# Patient Record
Sex: Female | Born: 1940 | Race: White | Hispanic: No | Marital: Single | Smoking: Never smoker
Health system: Southern US, Community
[De-identification: ages and names within clinical notes are randomized; demographics above are authoritative.]

## PROBLEM LIST (undated history)

## (undated) DIAGNOSIS — R600 Localized edema: Secondary | ICD-10-CM

## (undated) DIAGNOSIS — D649 Anemia, unspecified: Secondary | ICD-10-CM

## (undated) DIAGNOSIS — F419 Anxiety disorder, unspecified: Secondary | ICD-10-CM

## (undated) DIAGNOSIS — K219 Gastro-esophageal reflux disease without esophagitis: Secondary | ICD-10-CM

## (undated) DIAGNOSIS — Z8719 Personal history of other diseases of the digestive system: Secondary | ICD-10-CM

## (undated) DIAGNOSIS — I739 Peripheral vascular disease, unspecified: Secondary | ICD-10-CM

## (undated) DIAGNOSIS — M1990 Unspecified osteoarthritis, unspecified site: Secondary | ICD-10-CM

## (undated) DIAGNOSIS — N390 Urinary tract infection, site not specified: Secondary | ICD-10-CM

## (undated) DIAGNOSIS — Z01419 Encounter for gynecological examination (general) (routine) without abnormal findings: Secondary | ICD-10-CM

## (undated) DIAGNOSIS — K589 Irritable bowel syndrome without diarrhea: Secondary | ICD-10-CM

## (undated) DIAGNOSIS — Z9889 Other specified postprocedural states: Secondary | ICD-10-CM

## (undated) DIAGNOSIS — I8393 Asymptomatic varicose veins of bilateral lower extremities: Secondary | ICD-10-CM

## (undated) DIAGNOSIS — K5790 Diverticulosis of intestine, part unspecified, without perforation or abscess without bleeding: Secondary | ICD-10-CM

## (undated) HISTORY — DX: Unspecified osteoarthritis, unspecified site: M19.90

## (undated) HISTORY — DX: Asymptomatic varicose veins of bilateral lower extremities: I83.93

## (undated) HISTORY — DX: Peripheral vascular disease, unspecified: I73.9

## (undated) HISTORY — DX: Personal history of other diseases of the digestive system: Z87.19

## (undated) HISTORY — PX: BACK SURGERY: SHX140

## (undated) HISTORY — PX: TONSILLECTOMY: SUR1361

## (undated) HISTORY — DX: Diverticulosis of intestine, part unspecified, without perforation or abscess without bleeding: K57.90

## (undated) HISTORY — DX: Urinary tract infection, site not specified: N39.0

## (undated) HISTORY — DX: Irritable bowel syndrome, unspecified: K58.9

## (undated) HISTORY — DX: Encounter for gynecological examination (general) (routine) without abnormal findings: Z01.419

## (undated) HISTORY — DX: Other specified postprocedural states: Z98.890

## (undated) HISTORY — DX: Localized edema: R60.0

## (undated) HISTORY — DX: Gastro-esophageal reflux disease without esophagitis: K21.9

## (undated) HISTORY — PX: OTHER SURGICAL HISTORY: SHX169

## (undated) HISTORY — PX: ESOPHAGOGASTRODUODENOSCOPY (EGD) WITH ESOPHAGEAL DILATION: SHX5812

---

## 1998-02-27 ENCOUNTER — Other Ambulatory Visit: Admission: RE | Admit: 1998-02-27 | Discharge: 1998-02-27 | Payer: Self-pay | Admitting: Obstetrics and Gynecology

## 1999-12-23 ENCOUNTER — Encounter: Payer: Self-pay | Admitting: Obstetrics and Gynecology

## 1999-12-23 ENCOUNTER — Encounter: Admission: RE | Admit: 1999-12-23 | Discharge: 1999-12-23 | Payer: Self-pay | Admitting: Obstetrics and Gynecology

## 2003-03-15 ENCOUNTER — Encounter: Admission: RE | Admit: 2003-03-15 | Discharge: 2003-03-15 | Payer: Self-pay | Admitting: Obstetrics and Gynecology

## 2003-03-15 ENCOUNTER — Encounter: Payer: Self-pay | Admitting: Obstetrics and Gynecology

## 2003-05-20 ENCOUNTER — Other Ambulatory Visit: Admission: RE | Admit: 2003-05-20 | Discharge: 2003-05-20 | Payer: Self-pay | Admitting: Obstetrics and Gynecology

## 2004-05-25 ENCOUNTER — Other Ambulatory Visit: Admission: RE | Admit: 2004-05-25 | Discharge: 2004-05-25 | Payer: Self-pay | Admitting: Obstetrics and Gynecology

## 2004-07-06 ENCOUNTER — Encounter: Admission: RE | Admit: 2004-07-06 | Discharge: 2004-07-06 | Payer: Self-pay | Admitting: Gastroenterology

## 2004-11-30 ENCOUNTER — Ambulatory Visit (HOSPITAL_COMMUNITY): Admission: RE | Admit: 2004-11-30 | Discharge: 2004-11-30 | Payer: Self-pay | Admitting: Obstetrics and Gynecology

## 2005-06-03 ENCOUNTER — Other Ambulatory Visit: Admission: RE | Admit: 2005-06-03 | Discharge: 2005-06-03 | Payer: Self-pay | Admitting: Obstetrics and Gynecology

## 2005-12-03 ENCOUNTER — Ambulatory Visit: Payer: Self-pay | Admitting: Family Medicine

## 2005-12-16 ENCOUNTER — Ambulatory Visit: Payer: Self-pay | Admitting: Family Medicine

## 2005-12-24 ENCOUNTER — Encounter: Admission: RE | Admit: 2005-12-24 | Discharge: 2005-12-24 | Payer: Self-pay | Admitting: Orthopedic Surgery

## 2006-05-05 ENCOUNTER — Encounter: Admission: RE | Admit: 2006-05-05 | Discharge: 2006-05-05 | Payer: Self-pay | Admitting: Obstetrics and Gynecology

## 2006-07-14 ENCOUNTER — Other Ambulatory Visit: Admission: RE | Admit: 2006-07-14 | Discharge: 2006-07-14 | Payer: Self-pay | Admitting: Obstetrics and Gynecology

## 2007-05-02 DIAGNOSIS — K219 Gastro-esophageal reflux disease without esophagitis: Secondary | ICD-10-CM | POA: Insufficient documentation

## 2007-05-08 ENCOUNTER — Encounter: Admission: RE | Admit: 2007-05-08 | Discharge: 2007-05-08 | Payer: Self-pay | Admitting: Obstetrics and Gynecology

## 2007-05-09 ENCOUNTER — Ambulatory Visit: Payer: Self-pay | Admitting: Family Medicine

## 2007-05-09 DIAGNOSIS — M1990 Unspecified osteoarthritis, unspecified site: Secondary | ICD-10-CM

## 2007-05-09 DIAGNOSIS — R609 Edema, unspecified: Secondary | ICD-10-CM | POA: Insufficient documentation

## 2007-05-09 DIAGNOSIS — M179 Osteoarthritis of knee, unspecified: Secondary | ICD-10-CM | POA: Insufficient documentation

## 2007-05-09 DIAGNOSIS — Z9189 Other specified personal risk factors, not elsewhere classified: Secondary | ICD-10-CM | POA: Insufficient documentation

## 2007-07-20 ENCOUNTER — Other Ambulatory Visit: Admission: RE | Admit: 2007-07-20 | Discharge: 2007-07-20 | Payer: Self-pay | Admitting: Obstetrics and Gynecology

## 2007-12-25 ENCOUNTER — Ambulatory Visit: Payer: Self-pay | Admitting: Family Medicine

## 2007-12-25 LAB — CONVERTED CEMR LAB
Bilirubin Urine: NEGATIVE
Specific Gravity, Urine: 1.01
Urobilinogen, UA: 0.2

## 2008-01-03 ENCOUNTER — Encounter: Payer: Self-pay | Admitting: Family Medicine

## 2008-01-18 ENCOUNTER — Telehealth (INDEPENDENT_AMBULATORY_CARE_PROVIDER_SITE_OTHER): Payer: Self-pay | Admitting: *Deleted

## 2008-01-23 ENCOUNTER — Ambulatory Visit: Payer: Self-pay | Admitting: Family Medicine

## 2008-01-23 LAB — CONVERTED CEMR LAB
Bilirubin Urine: NEGATIVE
Glucose, Urine, Semiquant: NEGATIVE
Specific Gravity, Urine: 1.02
pH: 5

## 2008-02-06 ENCOUNTER — Ambulatory Visit: Payer: Self-pay | Admitting: Family Medicine

## 2008-02-06 ENCOUNTER — Telehealth: Payer: Self-pay | Admitting: Family Medicine

## 2008-02-06 LAB — CONVERTED CEMR LAB
Glucose, Urine, Semiquant: NEGATIVE
Specific Gravity, Urine: 1.015
pH: 6.5

## 2008-03-11 ENCOUNTER — Encounter: Payer: Self-pay | Admitting: Family Medicine

## 2008-05-22 ENCOUNTER — Encounter: Admission: RE | Admit: 2008-05-22 | Discharge: 2008-05-22 | Payer: Self-pay | Admitting: Obstetrics and Gynecology

## 2008-06-13 ENCOUNTER — Ambulatory Visit: Payer: Self-pay | Admitting: Family Medicine

## 2008-07-22 ENCOUNTER — Other Ambulatory Visit: Admission: RE | Admit: 2008-07-22 | Discharge: 2008-07-22 | Payer: Self-pay | Admitting: Obstetrics and Gynecology

## 2008-12-24 ENCOUNTER — Ambulatory Visit: Payer: Self-pay | Admitting: Sports Medicine

## 2008-12-24 DIAGNOSIS — M722 Plantar fascial fibromatosis: Secondary | ICD-10-CM

## 2008-12-24 DIAGNOSIS — M7750 Other enthesopathy of unspecified foot: Secondary | ICD-10-CM | POA: Insufficient documentation

## 2009-02-24 ENCOUNTER — Ambulatory Visit: Payer: Self-pay | Admitting: Sports Medicine

## 2009-04-29 ENCOUNTER — Ambulatory Visit: Payer: Self-pay | Admitting: Sports Medicine

## 2009-05-27 ENCOUNTER — Encounter: Admission: RE | Admit: 2009-05-27 | Discharge: 2009-05-27 | Payer: Self-pay | Admitting: Obstetrics and Gynecology

## 2009-07-24 ENCOUNTER — Other Ambulatory Visit: Admission: RE | Admit: 2009-07-24 | Discharge: 2009-07-24 | Payer: Self-pay | Admitting: Obstetrics and Gynecology

## 2009-10-09 ENCOUNTER — Ambulatory Visit: Payer: Self-pay | Admitting: Family Medicine

## 2009-11-18 ENCOUNTER — Encounter: Payer: Self-pay | Admitting: Family Medicine

## 2009-11-19 ENCOUNTER — Encounter (INDEPENDENT_AMBULATORY_CARE_PROVIDER_SITE_OTHER): Payer: Self-pay | Admitting: *Deleted

## 2009-12-03 ENCOUNTER — Encounter: Payer: Self-pay | Admitting: Family Medicine

## 2009-12-11 ENCOUNTER — Encounter (INDEPENDENT_AMBULATORY_CARE_PROVIDER_SITE_OTHER): Payer: Self-pay | Admitting: *Deleted

## 2010-01-08 ENCOUNTER — Encounter (INDEPENDENT_AMBULATORY_CARE_PROVIDER_SITE_OTHER): Payer: Self-pay | Admitting: *Deleted

## 2010-01-08 ENCOUNTER — Ambulatory Visit: Payer: Self-pay | Admitting: Gastroenterology

## 2010-01-08 DIAGNOSIS — R1314 Dysphagia, pharyngoesophageal phase: Secondary | ICD-10-CM

## 2010-01-19 ENCOUNTER — Ambulatory Visit: Payer: Self-pay | Admitting: Gastroenterology

## 2010-01-19 HISTORY — PX: ESOPHAGOGASTRODUODENOSCOPY: SHX1529

## 2010-06-01 ENCOUNTER — Ambulatory Visit: Payer: Self-pay | Admitting: Family Medicine

## 2010-07-01 ENCOUNTER — Encounter: Admission: RE | Admit: 2010-07-01 | Discharge: 2010-07-01 | Payer: Self-pay | Admitting: Family Medicine

## 2010-11-01 LAB — CONVERTED CEMR LAB
ALT: 13 units/L (ref 0–35)
ALT: 17 units/L (ref 0–35)
AST: 18 units/L (ref 0–37)
AST: 18 units/L (ref 0–37)
Albumin: 3.8 g/dL (ref 3.5–5.2)
Albumin: 3.8 g/dL (ref 3.5–5.2)
Albumin: 4 g/dL (ref 3.5–5.2)
BUN: 10 mg/dL (ref 6–23)
BUN: 8 mg/dL (ref 6–23)
Basophils Absolute: 0.1 10*3/uL (ref 0.0–0.1)
Basophils Relative: 0.6 % (ref 0.0–3.0)
Basophils Relative: 0.9 % (ref 0.0–3.0)
Bilirubin, Direct: 0 mg/dL (ref 0.0–0.3)
CO2: 30 meq/L (ref 19–32)
Chloride: 102 meq/L (ref 96–112)
Chloride: 104 meq/L (ref 96–112)
Chloride: 104 meq/L (ref 96–112)
Cholesterol: 204 mg/dL (ref 0–200)
Creatinine, Ser: 0.8 mg/dL (ref 0.4–1.2)
Direct LDL: 116.2 mg/dL
Eosinophils Absolute: 0.1 10*3/uL (ref 0.0–0.6)
Eosinophils Absolute: 0.1 10*3/uL (ref 0.0–0.7)
Eosinophils Absolute: 0.1 10*3/uL (ref 0.0–0.7)
Eosinophils Relative: 1.7 % (ref 0.0–5.0)
Eosinophils Relative: 2 % (ref 0.0–5.0)
GFR calc non Af Amer: 66 mL/min
GFR calc non Af Amer: 76 mL/min
Glucose, Bld: 85 mg/dL (ref 70–99)
HCT: 40.4 % (ref 36.0–46.0)
HCT: 40.9 % (ref 36.0–46.0)
HCT: 41.9 % (ref 36.0–46.0)
HDL: 53.4 mg/dL (ref >39.0)
Hemoglobin: 13.2 g/dL (ref 12.0–15.0)
LDL Cholesterol: 124 mg/dL — ABNORMAL HIGH (ref 0–99)
MCHC: 32.4 g/dL (ref 30.0–36.0)
MCHC: 33.7 g/dL (ref 30.0–36.0)
MCV: 91.7 fL (ref 78.0–100.0)
MCV: 92.7 fL (ref 78.0–100.0)
MCV: 93.2 fL (ref 78.0–100.0)
Monocytes Absolute: 0.5 10*3/uL (ref 0.1–1.0)
Monocytes Relative: 8.5 % (ref 3.0–11.0)
Monocytes Relative: 8.5 % (ref 3.0–12.0)
Neutro Abs: 4 10*3/uL (ref 1.4–7.7)
Neutrophils Relative %: 60.4 % (ref 43.0–77.0)
Neutrophils Relative %: 66.6 % (ref 43.0–77.0)
Platelets: 276 10*3/uL (ref 150–400)
Potassium: 4.2 meq/L (ref 3.5–5.1)
RBC: 4.39 M/uL (ref 3.87–5.11)
RBC: 4.41 M/uL (ref 3.87–5.11)
RBC: 4.52 M/uL (ref 3.87–5.11)
Sodium: 140 meq/L (ref 135–145)
Sodium: 142 meq/L (ref 135–145)
TSH: 2.42 microintl units/mL (ref 0.35–5.50)
TSH: 3 microintl units/mL (ref 0.35–5.50)
Total CHOL/HDL Ratio: 4
Total Protein: 7.4 g/dL (ref 6.0–8.3)
Total Protein: 7.6 g/dL (ref 6.0–8.3)
Triglycerides: 73 mg/dL (ref 0.0–149.0)
Triglycerides: 81 mg/dL (ref 0–149)
VLDL: 16 mg/dL (ref 0–40)
Vit D, 25-Hydroxy: 44 ng/mL (ref 30–89)
WBC: 6.5 10*3/uL (ref 4.5–10.5)
WBC: 8.7 10*3/uL (ref 4.5–10.5)

## 2010-11-05 NOTE — Assessment & Plan Note (Signed)
Summary: diarrhea x 1 week//ccm   Vital Signs:  Patient profile:   70 year old female Weight:      230 pounds BMI:     37.26 BP sitting:   104 / 74  (left arm) Cuff size:   regular  Vitals Entered By: Raechel Ache, RN (June 01, 2010 11:24 AM) CC: Started with diarrhea while in [place] July 24th- mother had MI and she thought it was due to nerves. Got a little better and then worse again last week, took Immodium all weekend.   History of Present Illness: Here for several weeks of intermittent diarrhea. This started during a vacation stay in [place] and continued until several days ago. Now she has felt much better for the past several days. No nausea or vomitting or fever. She has had mild generalized cramps, but is able to eat normally. No fever. She has been under a lot of stress since her mother recently had a light heart attack. She has been taking Omeprazole daily.   Allergies: No Known Drug Allergies  Past History:  Past Medical History: Reviewed history from 01/08/2010 and no changes required. GERD Osteoarthritis sees Dr. Artist Pais for gyn exams IBS LE edema frequent UTI's, sees Dr. Earlene Plater varicose leg veins Diverticulosis  9 years ago Urinary Tract Infection  Past Surgical History: Tonsillectomy Colonoscopy-2002 per Dr. Sherin Quarry, repeat in 10 years right knee arthroscopy per Dr. Eulah Pont 12-17-05  endovascular repair of bilateral leg varicosities per Dr. Jimmie Molly  Review of Systems  The patient denies anorexia, fever, weight loss, weight gain, vision loss, decreased hearing, hoarseness, chest pain, syncope, dyspnea on exertion, peripheral edema, prolonged cough, headaches, hemoptysis, abdominal pain, melena, hematochezia, severe indigestion/heartburn, hematuria, incontinence, genital sores, muscle weakness, suspicious skin lesions, transient blindness, difficulty walking, depression, unusual weight change, abnormal bleeding, enlarged lymph nodes, angioedema,  breast masses, and testicular masses.    Physical Exam  General:  Well-developed,well-nourished,in no acute distress; alert,appropriate and cooperative throughout examination Lungs:  Normal respiratory effort, chest expands symmetrically. Lungs are clear to auscultation, no crackles or wheezes. Heart:  Normal rate and regular rhythm. S1 and S2 normal without gallop, murmur, click, rub or other extra sounds. Abdomen:  Bowel sounds positive,abdomen soft and non-tender without masses, organomegaly or hernias noted.   Impression & Recommendations:  Problem # 1:  ABDOMINAL PAIN, GENERALIZED (ICD-789.07)  Complete Medication List: 1)  Aspirin 325 Mg Tabs (Aspirin) .Marland Kitchen.. 1 by mouth once daily 2)  Fish Oil 300 Mg Caps (Omega-3 fatty acids) .Marland Kitchen.. 1 by mouth once daily 3)  Calcium 500/d 500-400 Mg-unit Chew (Calcium-vitamin d) .Marland Kitchen.. 1 by mouth once daily 4)  Vitamin D 2000 Unit Tabs (Cholecalciferol) .... Once daily 5)  Levsin/sl 0.125 Mg Subl (Hyoscyamine sulfate) .Marland Kitchen.. 1 sl q 4-6 hrs as needed 6)  Omeprazole 40 Mg Cpdr (Omeprazole) .... Once daily  Patient Instructions: 1)  This seems to be her old IBS acting up, and her recent stress has been a big contributing factor. She seems to be getting better now, so we will follow up as needed .

## 2010-11-05 NOTE — Letter (Signed)
Summary: New Patient letter  [facility]  [address] [place], [place] [address]   Phone: [phone]  Fax: [phone]       12/11/2009 MRN: [id number]  Crystal Little [address] [place], [place]  [address]  Dear [name],  Welcome to the Gastroenterology Division at [facility].    You are scheduled to see Dr.  Jarold Motto on January 08, 2010 at 9:30am on the 3rd floor at [facility], [address].  We ask that you try to arrive at our office 15 minutes prior to your appointment time to allow for check-in.  We would like you to complete the enclosed self-administered evaluation form prior to your visit and bring it with you on the day of your appointment.  We will review it with you.  Also, please bring a complete list of all your medications or, if you prefer, bring the medication bottles and we will list them.  Please bring your insurance card so that we may make a copy of it.  If your insurance requires a referral to see a specialist, please bring your referral form from your primary care physician.  Co-payments are due at the time of your visit and may be paid by cash, check or credit card.     Your office visit will consist of a consult with your physician (includes a physical exam), any laboratory testing he/she may order, scheduling of any necessary diagnostic testing (e.g. x-ray, ultrasound, CT-scan), and scheduling of a procedure (e.g. Endoscopy, Colonoscopy) if required.  Please allow enough time on your schedule to allow for any/all of these possibilities.    If you cannot keep your appointment, please call [phone] to cancel or reschedule prior to your appointment date.  This allows [place] the opportunity to schedule an appointment for another patient in need of care.  If you do not cancel or reschedule by 5 p.m. the business day prior to your appointment date, you will be charged a $50.00 late cancellation/no-show fee.    Thank you for  choosing [facility] for your medical needs.  We appreciate the opportunity to care for you.  Please visit [place] at our website  to learn more about our practice.                     Sincerely,                                                             The Gastroenterology Division

## 2010-11-05 NOTE — Procedures (Signed)
Summary: Upper Endoscopy  Patient: Kaitland Lewellyn Note: All result statuses are Final unless otherwise noted.  Tests: (1) Upper Endoscopy (EGD)   EGD Upper Endoscopy       DONE     [facility]     [address]     [place], [place]  [address]           ENDOSCOPY PROCEDURE REPORT           PATIENT:  Lanaiya, Lantry  MR#:  [id number]     BIRTHDATE:  August 17, 1941, 68 yrs. old  GENDER:  female           ENDOSCOPIST:  Vania Rea. Jarold Motto, MD, [facility]     Referred by:           PROCEDURE DATE:  01/19/2010     PROCEDURE:  EGD with balloon dilatation     ASA CLASS:  Class I     INDICATIONS:  dysphagia, GERD           MEDICATIONS:   Fentanyl 25 mcg IV, Versed 5 mg IV, glycopyrrolate     (Robinal) 0.2 mg IV     TOPICAL ANESTHETIC:  Exactacain Spray           DESCRIPTION OF PROCEDURE:   After the risks benefits and     alternatives of the procedure were thoroughly explained, informed     consent was obtained.  The LB GIF-H180 [id number] endoscope was     introduced through the mouth and advanced to the second portion of     the duodenum, without limitations.  The instrument was slowly     withdrawn as the mucosa was fully examined.     <<PROCEDUREIMAGES>>           A hiatal hernia was found. large 5 cm. hh and stricture at GE     junction.DILATED #18 MM PNEUMATIC BALON AND HELD FOR .  Normal     duodenal folds were noted.  The stomach was entered and closely     examined. The antrum, angularis, and lesser curvature were well     visualized, including a retroflexed view of the cardia and fundus.     The stomach wall was normally distensable. The scope passed easily     through the pylorus into the duodenum.    Retroflexed views     revealed a hiatal hernia.    The scope was then withdrawn from the     patient and the procedure completed.           COMPLICATIONS:  None           ENDOSCOPIC IMPRESSION:     1) Hiatal hernia     2) Normal duodenal folds     3) Normal stomach     CHRONIC AND PEPTIC STRICTURE DILATED.     RECOMMENDATIONS:     1) Clear liquids until, then soft foods rest iof day. Resume     prior diet tomorrow.     2) continue current medications     CONSIDER FUNDOPLICATION.           REPEAT EXAM:  No           ______________________________     Vania Rea. Jarold Motto, MD, [facility]           CC:  Nelwyn Salisbury, MD           n.     Rosalie DoctorMarland Kitchen   Vania Rea. Jarold Motto at  01/19/2010 02:04 PM           Geradine Girt, [id number]  Note: An exclamation mark (!) indicates a result that was not dispersed into the flowsheet. Document Creation Date: 01/19/2010 2:04 PM _______________________________________________________________________  (1) Order result status: Final Collection or observation date-time: 01/19/2010 13:52 Requested date-time:  Receipt date-time:  Reported date-time:  Referring Physician:   Ordering Physician: Sheryn Bison [phone]) Specimen Source:  Source: Launa Grill Order Number: [id number] Lab site:

## 2010-11-05 NOTE — Letter (Signed)
Summary: [facility]  [facility]   Imported By: Maryln Gottron 12/26/2009 10:24:22  _____________________________________________________________________  External Attachment:    Type:   Image     Comment:   External Document

## 2010-11-05 NOTE — Letter (Signed)
Summary: EGD Instructions  [facility]  [address] [place], [place] [address]   Phone: [phone]  Fax: [phone]       Crystal Little    1941/01/05    MRN: [id number]       Procedure Day /Date: Monday, 01/19/10     Arrival Time:  1:00     Procedure Time: 2:00     Location of Procedure:                    _ X _ [facility] (4th Floor)    PREPARATION FOR ENDOSCOPY   On 01/19/10  THE DAY OF THE PROCEDURE:  1.   No solid foods, milk or milk products are allowed after midnight the night before your procedure.  2.   Do not drink anything colored red or purple.  Avoid juices with pulp.  No orange juice.  3.  You may drink clear liquids until 12:00, which is 2 hours before your procedure.                                                                                                CLEAR LIQUIDS INCLUDE: Water Jello Ice Popsicles Tea (sugar ok, no milk/cream) Powdered fruit flavored drinks Coffee (sugar ok, no milk/cream) Gatorade Juice: apple, white grape, white cranberry  Lemonade Clear bullion, consomm, broth Carbonated beverages (any kind) Strained chicken noodle soup Hard Candy   MEDICATION INSTRUCTIONS  Unless otherwise instructed, you should take regular prescription medications with a small sip of water as early as possible the morning of your procedure.                    OTHER INSTRUCTIONS  You will need a responsible adult at least 72 years of age to accompany you and drive you home.   This person must remain in the waiting room during your procedure.  Wear loose fitting clothing that is easily removed.  Leave jewelry and other valuables at home.  However, you may wish to bring a book to read or an iPod/MP3 player to listen to music as you wait for your procedure to start.  Remove all body piercing jewelry and leave at home.  Total time from sign-in until discharge is approximately 2-3 hours.  You should  go home directly after your procedure and rest.  You can resume normal activities the day after your procedure.  The day of your procedure you should not:   Drive   Make legal decisions   Operate machinery   Drink alcohol   Return to work  You will receive specific instructions about eating, activities and medications before you leave.    The above instructions have been reviewed and explained to me by   _______________________    I fully understand and can verbalize these instructions _____________________________ Date _________

## 2010-11-05 NOTE — Miscellaneous (Signed)
Summary: Orders Update   Clinical Lists Changes  Orders: Added new Referral order of Gastroenterology Referral (GI) - Signed 

## 2010-11-05 NOTE — Assessment & Plan Note (Signed)
Summary: emp/pt fasting/cjr/pt rsc from bmp/cjr   Vital Signs:  Patient profile:   70 year old female Height:      66 inches Weight:      234 pounds BMI:     37.91 Temp:     98.1 degrees F oral Pulse rate:   91 / minute BP sitting:   122 / 70  (left arm) Cuff size:   large  Vitals Entered By: Alfred Levins, CMA (October 09, 2009 9:08 AM) CC: cpx, fasting, no pap   History of Present Illness: 70 yr old female for cpx. She feels well and has no concerns. She had a DEXA last month per Dr. Thomasena Edis, but she has not heard back about that yet.   Current Medications (verified): 1)  Aspirin 325 Mg Tabs (Aspirin) .Marland Kitchen.. 1 By Mouth Once Daily 2)  Fish Oil 300 Mg  Caps (Omega-3 Fatty Acids) .Marland Kitchen.. 1 By Mouth Once Daily 3)  Calcium 500/d 500-400 Mg-Unit  Chew (Calcium-Vitamin D) .Marland Kitchen.. 1 By Mouth Once Daily 4)  Nexium 40 Mg Cpdr (Esomeprazole Magnesium) .... Once Daily 5)  Vitamin D 2000 Unit Tabs (Cholecalciferol) .... Once Daily  Allergies (verified): No Known Drug Allergies  Past History:  Past Medical History: GERD Osteoarthritis sees Dr. Artist Pais for gyn exams IBS LE edema frequent UTI's, sees Dr. Earlene Plater varicose leg veins  Past Surgical History: Reviewed history from 06/13/2008 and no changes required. Tonsillectomy Colonoscopy-2002 per Dr. Sherin Quarry, repeat in 10 years right knee arthroscopy per Dr. Eulah Pont 12-17-05  endovascular repair of bilateral leg varicosities per Dr. Jimmie Molly  Family History: Reviewed history from 06/13/2008 and no changes required. Family History Lung cancer pancreatic cancer Family History of CAD Female 1st degree relative <50  Social History: Reviewed history from 06/13/2008 and no changes required. Never Smoked Alcohol use-yes Retired Married  Review of Systems  The patient denies anorexia, fever, weight loss, weight gain, vision loss, decreased hearing, hoarseness, chest pain, syncope, dyspnea on exertion, peripheral edema, prolonged  cough, headaches, hemoptysis, abdominal pain, melena, hematochezia, severe indigestion/heartburn, hematuria, incontinence, genital sores, muscle weakness, suspicious skin lesions, transient blindness, difficulty walking, depression, unusual weight change, abnormal bleeding, enlarged lymph nodes, angioedema, breast masses, and testicular masses.    Physical Exam  General:  overweight-appearing.   Head:  Normocephalic and atraumatic without obvious abnormalities. No apparent alopecia or balding. Eyes:  No corneal or conjunctival inflammation noted. EOMI. Perrla. Funduscopic exam benign, without hemorrhages, exudates or papilledema. Vision grossly normal. Ears:  External ear exam shows no significant lesions or deformities.  Otoscopic examination reveals clear canals, tympanic membranes are intact bilaterally without bulging, retraction, inflammation or discharge. Hearing is grossly normal bilaterally. Nose:  External nasal examination shows no deformity or inflammation. Nasal mucosa are pink and moist without lesions or exudates. Mouth:  Oral mucosa and oropharynx without lesions or exudates.  Teeth in good repair. Neck:  No deformities, masses, or tenderness noted. Chest Wall:  No deformities, masses, or tenderness noted. Lungs:  Normal respiratory effort, chest expands symmetrically. Lungs are clear to auscultation, no crackles or wheezes. Heart:  Normal rate and regular rhythm. S1 and S2 normal without gallop, murmur, click, rub or other extra sounds. EKG normal Abdomen:  Bowel sounds positive,abdomen soft and non-tender without masses, organomegaly or hernias noted. Msk:  No deformity or scoliosis noted of thoracic or lumbar spine.   Pulses:  R and L carotid,radial,femoral,dorsalis pedis and posterior tibial pulses are full and equal bilaterally Extremities:  No clubbing, cyanosis, edema,  or deformity noted with normal full range of motion of all joints.   Neurologic:  No cranial nerve deficits  noted. Station and gait are normal. Plantar reflexes are down-going bilaterally. DTRs are symmetrical throughout. Sensory, motor and coordinative functions appear intact. Skin:  Intact without suspicious lesions or rashes Cervical Nodes:  No lymphadenopathy noted Axillary Nodes:  No palpable lymphadenopathy Inguinal Nodes:  No significant adenopathy Psych:  Cognition and judgment appear intact. Alert and cooperative with normal attention span and concentration. No apparent delusions, illusions, hallucinations   Impression & Recommendations:  Problem # 1:  EXAMINATION, ROUTINE MEDICAL (ICD-V70.0)  Orders: UA Dipstick w/o Micro (automated)  (81003) EKG w/ Interpretation (93000) Venipuncture ([address]) TLB-Lipid Panel (80061-LIPID) TLB-BMP (Basic Metabolic Panel-BMET) (80048-METABOL) TLB-CBC Platelet - w/Differential (85025-CBCD) TLB-Hepatic/Liver Function Pnl (80076-HEPATIC) TLB-TSH (Thyroid Stimulating Hormone) (84443-TSH) T-Vitamin D (25-Hydroxy) ([address]-09811)  Complete Medication List: 1)  Aspirin 325 Mg Tabs (Aspirin) .Marland Kitchen.. 1 by mouth once daily 2)  Fish Oil 300 Mg Caps (Omega-3 fatty acids) .Marland Kitchen.. 1 by mouth once daily 3)  Calcium 500/d 500-400 Mg-unit Chew (Calcium-vitamin d) .Marland Kitchen.. 1 by mouth once daily 4)  Nexium 40 Mg Cpdr (Esomeprazole magnesium) .... Once daily 5)  Vitamin D 2000 Unit Tabs (Cholecalciferol) .... Once daily  Other Orders: TD Toxoids IM 7 YR + (91478) Pneumococcal Vaccine (29562) Admin 1st Vaccine (13086) Admin of Any Addtl Vaccine (57846)  Patient Instructions: 1)  It is important that you exercise reguarly at least 20 minutes 5 times a week. If you develop chest pain, have severe difficulty breathing, or feel very tired, stop exercising immediately and seek medical attention.  2)  You need to lose weight. Consider a lower calorie diet and regular exercise.  3)  get labs today   Immunizations Administered:  Tetanus Vaccine:    Vaccine Type: Td    Site:  right deltoid    Mfr: Sanofi Pasteur    Dose: 0.5 ml    Route: IM    Given by: Alfred Levins, CMA    Exp. Date: 01/15/2011    Lot #: [id number]  Pneumonia Vaccine:    Vaccine Type: Pneumovax (Medicare)    Site: left deltoid    Mfr: Merck    Dose: 0.5 ml    Route: IM    Given by: Alfred Levins, CMA    Exp. Date: 09/13/2010    Lot #: [id number]   Preventive Care Screening  Last Pneumovax:    Date:  10/09/2009    Results:  Pneumovax (Medicare)  Last Tetanus Booster:    Date:  10/09/2009    Results:  Td  Mammogram:    Date:  04/03/2009    Results:  normal   Pap Smear:    Date:  04/03/2009    Results:  abnormal   Appended Document: emp/pt fasting/cjr/pt rsc from bmp/cjr  Laboratory Results   Urine Tests    Routine Urinalysis   Color: yellow Appearance: Clear Glucose: negative   (Normal Range: Negative) Bilirubin: negative   (Normal Range: Negative) Ketone: negative   (Normal Range: Negative) Spec. Gravity: 1.015   (Normal Range: 1.003-1.035) Blood: negative   (Normal Range: Negative) pH: 6.5   (Normal Range: 5.0-8.0) Protein: negative   (Normal Range: Negative) Urobilinogen: 0.2   (Normal Range: 0-1) Nitrite: positive   (Normal Range: Negative) Leukocyte Esterace: trace   (Normal Range: Negative)    Comments: Rita Ohara  October 09, 2009 1:44 PM      Appended Document: emp/pt fasting/cjr/pt rsc from bmp/cjr  she has another UTI. Call in Cipro 500 mg two times a day for 7 days  Appended Document: emp/pt fasting/cjr/pt rsc from bmp/cjr pt aware   Prescriptions: CIPRO 500 MG TABS (CIPROFLOXACIN HCL) 1 by mouth 2 times daily  #14 x 0   Entered by:   Alfred Levins, CMA   Authorized by:   Nelwyn Salisbury MD   Signed by:   Alfred Levins, CMA on 10/13/2009   Method used:   Electronically to        [facility] (retail)       [address]       [place], [place]  [address]       Ph: [phone] or [phone]       Fax: [phone]   RxID:    [id number]

## 2010-11-05 NOTE — Letter (Signed)
Summary: New Patient letter  [facility]  [address] [place], [place] [address]   Phone: [phone]  Fax: [phone]       11/19/2009 MRN: [id number]  Crystal Little [address] [place], [place]  [address]  Dear Crystal Little,  Welcome to the Gastroenterology Division at [facility].    You are scheduled to see Dr.  Jarold Motto on 12-12-09 at [facility] on the 3rd floor at [facility], [address].  We ask that you try to arrive at our office 15 minutes prior to your appointment time to allow for check-in.  We would like you to complete the enclosed self-administered evaluation form prior to your visit and bring it with you on the day of your appointment.  We will review it with you.  Also, please bring a complete list of all your medications or, if you prefer, bring the medication bottles and we will list them.  Please bring your insurance card so that we may make a copy of it.  If your insurance requires a referral to see a specialist, please bring your referral form from your primary care physician.  Co-payments are due at the time of your visit and may be paid by cash, check or credit card.     Your office visit will consist of a consult with your physician (includes a physical exam), any laboratory testing he/she may order, scheduling of any necessary diagnostic testing (e.g. x-ray, ultrasound, CT-scan), and scheduling of a procedure (e.g. Endoscopy, Colonoscopy) if required.  Please allow enough time on your schedule to allow for any/all of these possibilities.    If you cannot keep your appointment, please call [phone] to cancel or reschedule prior to your appointment date.  This allows [place] the opportunity to schedule an appointment for another patient in need of care.  If you do not cancel or reschedule by 5 p.m. the business day prior to your appointment date, you will be charged a $50.00 late cancellation/no-show fee.    Thank you for choosing  [facility] for your medical needs.  We appreciate the opportunity to care for you.  Please visit [place] at our website  to learn more about our practice.                     Sincerely,                                                             The Gastroenterology Division

## 2010-11-05 NOTE — Assessment & Plan Note (Signed)
Summary: BLOOD IN STOOLS/YF   History of Present Illness Visit Type: Initial Consult Primary GI MD: Sheryn Bison MD FACP FAGA Primary Provider: Heath Gold Requesting Provider: Heath Gold Chief Complaint: Problems with hiatal hernia History of Present Illness:   70 year old Caucasian female with refractory GERD despite daily Nexium. She continues to have food-related burning substernal chest pain with progressive solid food dysphagia. She has no cardiovascular, pulmonary, or hepatobiliary complaints. She is obese and her weight is been going up steadily. She is previous patient of Dr. Blinda Leatherwood and had a negative colonoscopy allegedly 9 years ago. She has not had previous endoscopy or upper GI series.  She denies extra esophageal manifestations of GERD, Raynaud's phenomenon, or any significant systemic disease. 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 Review of her labs shows no evidence of anemia or other metabolic disturbances. She has not had cardiac evaluation. Family history noncontributory.   GI Review of Systems    Reports acid reflux, belching, bloating, heartburn, and  weight gain.      Denies abdominal pain, chest pain, dysphagia with liquids, dysphagia with solids, loss of appetite, nausea, vomiting, vomiting blood, and  weight loss.      Reports hemorrhoids.     Denies anal fissure, black tarry stools, change in bowel habit, constipation, diarrhea, diverticulosis, fecal incontinence, heme positive stool, irritable bowel syndrome, jaundice, light color stool, liver problems, rectal bleeding, and  rectal pain.    Current Medications (verified): 1)  Aspirin 325 Mg Tabs (Aspirin) .Marland Kitchen.. 1 By Mouth Once Daily 2)  Fish Oil 300 Mg  Caps (Omega-3 Fatty Acids) .Marland Kitchen.. 1 By Mouth Once Daily 3)  Calcium 500/d 500-400 Mg-Unit  Chew (Calcium-Vitamin D) .Marland Kitchen.. 1 By Mouth Once Daily 4)   Nexium 40 Mg Cpdr (Esomeprazole Magnesium) .... Once Daily 5)  Vitamin D 2000 Unit Tabs (Cholecalciferol) .... Once Daily  Allergies (verified): No Known Drug Allergies  Past History:  Past medical, surgical, family and social histories (including risk factors) reviewed for relevance to current acute and chronic problems.  Past Medical History: GERD Osteoarthritis sees Dr. Artist Pais for gyn exams IBS LE edema frequent UTI's, sees Dr. Earlene Plater varicose leg veins Diverticulosis  9 years ago Urinary Tract Infection  Past Surgical History: Reviewed history from 06/13/2008 and no changes required. Tonsillectomy Colonoscopy-2002 per Dr. Sherin Quarry, repeat in 10 years right knee arthroscopy per Dr. Eulah Pont 12-17-05  endovascular repair of bilateral leg varicosities per Dr. Jimmie Molly  Family History: Reviewed history from 06/13/2008 and no changes required. Family History Lung cancer pancreatic cancer Family History of CAD Female 1st degree relative <50  Social History: Reviewed history from 06/13/2008 and no changes required. Never Smoked Alcohol use-yes Retired Married 3 children Daily Caffeine Use  1 per day Illicit Drug Use - no Drug Use:  no  Review of Systems       The patient complains of allergy/sinus and swelling of feet/legs.  The patient denies anemia, anxiety-new, arthritis/joint pain, back pain, blood in urine, breast changes/lumps, change in vision, confusion, cough, coughing up blood, depression-new, fainting, fatigue, fever, headaches-new, hearing problems, heart murmur, heart rhythm changes, itching, menstrual pain, muscle pains/cramps, night sweats, nosebleeds, pregnancy symptoms, shortness of breath, skin rash, sleeping problems, sore throat, swollen lymph glands, thirst - excessive , urination - excessive , urination changes/pain, urine leakage, vision changes, and voice change.    Vital Signs:  Patient profile:   70  year old female Height:      66  inches Weight:      235 pounds BMI:     38.07 BSA:     2.14 Pulse rate:   88 / minute Pulse rhythm:   regular BP sitting:   118 / 80  (left arm)  Vitals Entered By: Merri Ray CMA Duncan Dull) (January 08, 2010 9:33 AM)  Physical Exam  General:  Well developed, well nourished, no acute distress.healthy appearing.   Head:  Normocephalic and atraumatic. Eyes:  PERRLA, no icterus.exam deferred to patient's ophthalmologist.   Neck:  Supple; no masses or thyromegaly. Lungs:  Clear throughout to auscultation. Heart:  Regular rate and rhythm; no murmurs, rubs,  or bruits. Abdomen:  Soft, nontender and nondistended. No masses, hepatosplenomegaly or hernias noted. Normal bowel sounds. Pulses:  Normal pulses noted. Extremities:  trace pedal edema.   Neurologic:  Alert and  oriented x4;  grossly normal neurologically. Cervical Nodes:  No significant cervical adenopathy. Psych:  Alert and cooperative. Normal mood and affect.   Impression & Recommendations:  Problem # 1:  GERD (ICD-530.81) Assessment Deteriorated Reflux regime reviewed with patient and the best use Nexium twice a day if needed with p.r.n. sublingual Levsin. Endoscopy for diagnostic purposes and possible dilatation has been scheduled at her convenience. She may need upper abdominal ultrasound exam to exclude cholelithiasis. She did see her patient education video on GERD and its management.  Problem # 2:  DYSPHAGIA (EAV-409.81) Assessment: Deteriorated Probable that we'll need endoscopy and dilation therapy.  Problem # 3:  DEPENDENT EDEMA (ICD-782.3) Assessment: Improved History the bilateral varicosities with previous vein surgery. She does take daily aspirin and frequent NSAIDs.  Patient Instructions: 1)  You are being scheduled for an Endoscopy. 2)  A prescription for levsin will be sent to your pharmacy, 3)  Continue Nexium. 4)  GI Reflux brochure given.  5)  The medication list was reviewed and reconciled.  All  changed / newly prescribed medications were explained.  A complete medication list was provided to the patient / caregiver. 6)  Copy sent to : Dr. Gershon Crane 7)  Please continue current medications.  8)  Avoid foods high in acid content ( tomatoes, citrus juices, spicy foods) . Avoid eating within 3 to 4 hours of lying down or before exercising. Do not over eat; try smaller more frequent meals. Elevate head of bed four inches when sleeping.  9)  Conscious Sedation brochure given.  10)  Upper Endoscopy with Dilatation brochure given.   Appended Document: BLOOD IN STOOLS/YF    Clinical Lists Changes  Medications: Added new medication of LEVSIN/SL 0.125 MG  SUBL (HYOSCYAMINE SULFATE) 1 sl q 4-6 hrs as needed - Signed Rx of LEVSIN/SL 0.125 MG  SUBL (HYOSCYAMINE SULFATE) 1 sl q 4-6 hrs as needed;  #40 x 3;  Signed;  Entered by: Ashok Cordia RN;  Authorized by: Mardella Layman MD [facility];  Method used: Electronically to [facility] [address], [place], [place], [place]  [address], Ph: [phone], Fax: [phone] Orders: Added new Test order of EGD (EGD) - Signed    Prescriptions: LEVSIN/SL 0.125 MG  SUBL (HYOSCYAMINE SULFATE) 1 sl q 4-6 hrs as needed  #40 x 3   Entered by:   Ashok Cordia RN   Authorized by:   Mardella Layman MD [facility]   Signed by:   Ashok Cordia RN on 01/08/2010   Method used:   Electronically to  [facility] (retail)       [address]       [place], [place]  [address]       Ph: [phone]       Fax: [phone]   RxID:   [id number]

## 2011-02-24 ENCOUNTER — Encounter: Payer: Self-pay | Admitting: Family Medicine

## 2011-02-24 ENCOUNTER — Ambulatory Visit (INDEPENDENT_AMBULATORY_CARE_PROVIDER_SITE_OTHER): Payer: Medicare Other | Admitting: Family Medicine

## 2011-02-24 VITALS — BP 118/70 | HR 79 | Temp 98.4°F | Resp 14 | Wt 217.6 lb

## 2011-02-24 DIAGNOSIS — Z79899 Other long term (current) drug therapy: Secondary | ICD-10-CM

## 2011-02-24 DIAGNOSIS — Z136 Encounter for screening for cardiovascular disorders: Secondary | ICD-10-CM

## 2011-02-24 DIAGNOSIS — Z0000 Encounter for general adult medical examination without abnormal findings: Secondary | ICD-10-CM

## 2011-02-24 DIAGNOSIS — J329 Chronic sinusitis, unspecified: Secondary | ICD-10-CM

## 2011-02-24 LAB — CBC WITH DIFFERENTIAL/PLATELET
Basophils Absolute: 0 10*3/uL (ref 0.0–0.1)
Basophils Relative: 0.2 % (ref 0.0–3.0)
Eosinophils Absolute: 0.2 10*3/uL (ref 0.0–0.7)
HCT: 36 % (ref 36.0–46.0)
Hemoglobin: 12.1 g/dL (ref 12.0–15.0)
Lymphocytes Relative: 16 % (ref 12.0–46.0)
Lymphs Abs: 1.5 10*3/uL (ref 0.7–4.0)
MCHC: 33.5 g/dL (ref 30.0–36.0)
MCV: 92.2 fl (ref 78.0–100.0)
Monocytes Absolute: 0.6 10*3/uL (ref 0.1–1.0)
Neutro Abs: 7.2 10*3/uL (ref 1.4–7.7)
RBC: 3.9 Mil/uL (ref 3.87–5.11)
RDW: 14.8 % — ABNORMAL HIGH (ref 11.5–14.6)

## 2011-02-24 LAB — LIPID PANEL
Cholesterol: 145 mg/dL (ref 0–200)
LDL Cholesterol: 84 mg/dL (ref 0–99)
VLDL: 11.6 mg/dL (ref 0.0–40.0)

## 2011-02-24 LAB — HEPATIC FUNCTION PANEL
ALT: 13 U/L (ref 0–35)
AST: 17 U/L (ref 0–37)
Alkaline Phosphatase: 76 U/L (ref 39–117)
Total Bilirubin: 0.6 mg/dL (ref 0.3–1.2)

## 2011-02-24 LAB — POCT URINALYSIS DIPSTICK
Bilirubin, UA: NEGATIVE
Blood, UA: NEGATIVE
Ketones, UA: NEGATIVE
Protein, UA: NEGATIVE
Spec Grav, UA: 1.02
pH, UA: 7

## 2011-02-24 LAB — BASIC METABOLIC PANEL
BUN: 10 mg/dL (ref 6–23)
Chloride: 103 mEq/L (ref 96–112)
GFR: 64.69 mL/min (ref >60.00)
Potassium: 4.7 mEq/L (ref 3.5–5.1)
Sodium: 140 mEq/L (ref 135–145)

## 2011-02-24 MED ORDER — OMEPRAZOLE 40 MG PO CPDR: 40.0000 mg | DELAYED_RELEASE_CAPSULE | Freq: Every day | ORAL | Status: DC

## 2011-02-24 MED ORDER — AMOXICILLIN-POT CLAVULANATE 875-125 MG PO TABS: 1.0000 | ORAL_TABLET | Freq: Two times a day (BID) | ORAL | Status: AC

## 2011-02-24 NOTE — Progress Notes (Signed)
  Subjective:    Patient ID: Crystal Little, female    DOB: [birth year], 70 y.o.   MRN: [id number]  HPI 70 yr old female for a cpx and also for upper respiratory symptoms that began one week ago. She has had sinus congestion, PND, and a cough producing green sputum. No fever. Otherwise she has been doing well. She exercises regularly at the [facility].    Review of Systems  Constitutional: Negative.   HENT: Negative.   Eyes: Negative.   Respiratory: Negative.   Cardiovascular: Negative.   Gastrointestinal: Negative.   Genitourinary: Negative for dysuria, urgency, frequency, hematuria, flank pain, decreased urine volume, enuresis, difficulty urinating, pelvic pain and dyspareunia.  Musculoskeletal: Negative.   Skin: Negative.   Neurological: Negative.   Hematological: Negative.   Psychiatric/Behavioral: Negative.        Objective:   Physical Exam  Constitutional: She is oriented to person, place, and time. She appears well-developed and well-nourished. No distress.  HENT:  Head: Normocephalic and atraumatic.  Right Ear: External ear normal.  Left Ear: External ear normal.  Nose: Nose normal.  Mouth/Throat: Oropharynx is clear and moist. No oropharyngeal exudate.  Eyes: Conjunctivae and EOM are normal. Pupils are equal, round, and reactive to light. No scleral icterus.  Neck: Normal range of motion. Neck supple. No JVD present. No thyromegaly present.  Cardiovascular: Normal rate, regular rhythm, normal heart sounds and intact distal pulses.  Exam reveals no gallop and no friction rub.   No murmur heard.      EKG normal   Pulmonary/Chest: Effort normal and breath sounds normal. No respiratory distress. She has no wheezes. She has no rales. She exhibits no tenderness.  Abdominal: Soft. Bowel sounds are normal. She exhibits no distension and no mass. There is no tenderness. There is no rebound and no guarding.  Musculoskeletal: Normal range of motion. She exhibits no edema and no  tenderness.  Lymphadenopathy:    She has no cervical adenopathy.  Neurological: She is alert and oriented to person, place, and time. She has normal reflexes. No cranial nerve deficit. She exhibits normal muscle tone. Coordination normal.  Skin: Skin is warm and dry. No rash noted. No erythema.  Psychiatric: She has a normal mood and affect. Her behavior is normal. Judgment and thought content normal.          Assessment & Plan:  Get fasting labs. Set up another colonoscopy. Treat the sinusitis with Augmentin.

## 2011-02-25 ENCOUNTER — Telehealth: Payer: Self-pay | Admitting: *Deleted

## 2011-02-25 MED ORDER — CIPROFLOXACIN HCL 500 MG PO TABS: 500.0000 mg | ORAL_TABLET | Freq: Two times a day (BID) | ORAL | Status: AC

## 2011-02-25 NOTE — Telephone Encounter (Signed)
I called pt and told her she a UTI, she is taking amoxicillin  for a sinus infection.  Is it ok to take both Cipro and Amoxicillin together? Per Dr Rodena Medin ok to take both.

## 2011-02-26 ENCOUNTER — Encounter: Payer: Self-pay | Admitting: Gastroenterology

## 2011-03-02 ENCOUNTER — Telehealth: Payer: Self-pay | Admitting: Family Medicine

## 2011-03-02 NOTE — Telephone Encounter (Signed)
Pt would like bloodwork results. Pt had sinus inf was prescribed amoxicilline also pt urine should uti she was prescribed another abx. Pt would like to know should she take 2 abx

## 2011-03-03 NOTE — Telephone Encounter (Signed)
Pt informed

## 2011-03-03 NOTE — Progress Notes (Signed)
Pt Informed. Also told to continue both ABXs.

## 2011-03-03 NOTE — Telephone Encounter (Signed)
Yes she should take both antibiotics together. See the lab report for results

## 2011-04-05 ENCOUNTER — Ambulatory Visit (AMBULATORY_SURGERY_CENTER): Payer: Medicare Other | Admitting: *Deleted

## 2011-04-05 ENCOUNTER — Encounter: Payer: Self-pay | Admitting: Gastroenterology

## 2011-04-05 VITALS — Ht 67.0 in | Wt 215.0 lb

## 2011-04-05 DIAGNOSIS — Z1211 Encounter for screening for malignant neoplasm of colon: Secondary | ICD-10-CM

## 2011-04-05 MED ORDER — PEG-KCL-NACL-NASULF-NA ASC-C 100 G PO SOLR: ORAL | Status: DC

## 2011-04-26 ENCOUNTER — Ambulatory Visit (AMBULATORY_SURGERY_CENTER): Payer: Medicare Other | Admitting: Gastroenterology

## 2011-04-26 ENCOUNTER — Encounter: Payer: Self-pay | Admitting: Gastroenterology

## 2011-04-26 DIAGNOSIS — K5730 Diverticulosis of large intestine without perforation or abscess without bleeding: Secondary | ICD-10-CM | POA: Insufficient documentation

## 2011-04-26 DIAGNOSIS — R1084 Generalized abdominal pain: Secondary | ICD-10-CM

## 2011-04-26 DIAGNOSIS — Z1211 Encounter for screening for malignant neoplasm of colon: Secondary | ICD-10-CM

## 2011-04-26 HISTORY — PX: COLONOSCOPY: SHX174

## 2011-04-26 MED ORDER — SODIUM CHLORIDE 0.9 % IV SOLN: 500.0000 mL | INTRAVENOUS | Status: DC

## 2011-04-26 NOTE — Patient Instructions (Signed)
Please read the handouts given to you by the recovery room nurse.   If you still have gas, try drinking warm beverages and getting up on all fours to pass it.   If it is bad, feel free to call us.   Resume all of your routine medications.  Please increase the fiber in your diet.   Call if you need [place] at [phone]. Thank-you.

## 2011-04-27 ENCOUNTER — Telehealth: Payer: Self-pay | Admitting: *Deleted

## 2011-04-27 NOTE — Telephone Encounter (Signed)

## 2011-05-03 ENCOUNTER — Telehealth: Payer: Self-pay | Admitting: Family Medicine

## 2011-05-03 NOTE — Telephone Encounter (Signed)
Pt is requesting to have a prescription for shingles shot called in to [facility]  [phone]) for her and her husband Crystal Little DOB [birth year].

## 2011-05-04 NOTE — Telephone Encounter (Signed)
I spoke with [facility] and gave approval for shingles vaccine, per Dr. Clent Ridges.

## 2011-05-04 NOTE — Telephone Encounter (Signed)
Please take care of both of these  

## 2011-07-06 ENCOUNTER — Other Ambulatory Visit: Payer: Self-pay | Admitting: Family Medicine

## 2011-07-06 DIAGNOSIS — Z1231 Encounter for screening mammogram for malignant neoplasm of breast: Secondary | ICD-10-CM

## 2011-07-21 ENCOUNTER — Ambulatory Visit: Payer: Medicare Other

## 2011-08-04 ENCOUNTER — Ambulatory Visit
Admission: RE | Admit: 2011-08-04 | Discharge: 2011-08-04 | Disposition: A | Discharge status: Home / Self Care | Payer: Medicare Other | Source: Ambulatory Visit | Attending: Family Medicine | Admitting: Family Medicine

## 2011-08-04 DIAGNOSIS — Z1231 Encounter for screening mammogram for malignant neoplasm of breast: Secondary | ICD-10-CM

## 2011-10-12 ENCOUNTER — Encounter: Payer: Self-pay | Admitting: Family Medicine

## 2011-10-12 ENCOUNTER — Ambulatory Visit (INDEPENDENT_AMBULATORY_CARE_PROVIDER_SITE_OTHER): Payer: Medicare Other | Admitting: Family Medicine

## 2011-10-12 VITALS — BP 130/70 | HR 106 | Temp 98.3°F | Wt 234.0 lb

## 2011-10-12 DIAGNOSIS — J329 Chronic sinusitis, unspecified: Secondary | ICD-10-CM

## 2011-10-12 DIAGNOSIS — R600 Localized edema: Secondary | ICD-10-CM

## 2011-10-12 DIAGNOSIS — G4700 Insomnia, unspecified: Secondary | ICD-10-CM

## 2011-10-12 DIAGNOSIS — R609 Edema, unspecified: Secondary | ICD-10-CM

## 2011-10-12 MED ORDER — FLUCONAZOLE 150 MG PO TABS: 150.0000 mg | ORAL_TABLET | Freq: Once | ORAL | Status: AC

## 2011-10-12 MED ORDER — FUROSEMIDE 20 MG PO TABS: 20.0000 mg | ORAL_TABLET | Freq: Every day | ORAL | Status: DC

## 2011-10-12 MED ORDER — TEMAZEPAM 30 MG PO CAPS: 30.0000 mg | ORAL_CAPSULE | Freq: Every evening | ORAL | Status: DC | PRN

## 2011-10-12 MED ORDER — AMOXICILLIN-POT CLAVULANATE 875-125 MG PO TABS: 1.0000 | ORAL_TABLET | Freq: Two times a day (BID) | ORAL | Status: AC

## 2011-10-12 NOTE — Progress Notes (Signed)
  Subjective:    Patient ID: Crystal Little, female    DOB: [birth year], 71 y.o.   MRN: [id number]  HPI Here for several reasons. First she has been dealing with a sinus infection for 6 weeks. She has had 2 courses of Amoxicillin from Urgent Care, and each time she feels a little better but not totally well. She still has stuffy head, sinus pressure, PND, and a ST. No cough or fever. Also she has swelling in the legs which can be a little painful. She has had several ablations for varicose veins. Lastly she has trouble sleeping every night.   Review of Systems  Constitutional: Negative.   HENT: Positive for congestion, postnasal drip and sinus pressure.   Eyes: Negative.   Respiratory: Negative.   Cardiovascular: Positive for leg swelling. Negative for chest pain and palpitations.       Objective:   Physical Exam  Constitutional: She appears well-developed and well-nourished.  HENT:  Right Ear: External ear normal.  Left Ear: External ear normal.  Nose: Nose normal.  Mouth/Throat: Oropharynx is clear and moist. No oropharyngeal exudate.  Eyes: Conjunctivae are normal.  Cardiovascular: Normal rate, regular rhythm, normal heart sounds and intact distal pulses.   Pulmonary/Chest: Effort normal and breath sounds normal.  Musculoskeletal:       2+ edema in both lower legs. Not tender or warm or red  Lymphadenopathy:    She has no cervical adenopathy.          Assessment & Plan:  Start Lasix daily. Try Augmentin for 10 days. Try Temazepam at bedtime.

## 2011-10-15 MED ORDER — HALOBETASOL PROPIONATE 0.05 % EX CREA: TOPICAL_CREAM | Freq: Two times a day (BID) | CUTANEOUS | Status: DC

## 2011-10-15 NOTE — Progress Notes (Signed)
Addended by: Gershon Crane A on: 10/15/2011 01:05 PM   Modules accepted: Orders

## 2011-12-14 ENCOUNTER — Other Ambulatory Visit: Payer: Self-pay | Admitting: *Deleted

## 2011-12-14 ENCOUNTER — Encounter (INDEPENDENT_AMBULATORY_CARE_PROVIDER_SITE_OTHER): Payer: Medicare Other

## 2011-12-14 DIAGNOSIS — M79609 Pain in unspecified limb: Secondary | ICD-10-CM

## 2011-12-14 DIAGNOSIS — M7989 Other specified soft tissue disorders: Secondary | ICD-10-CM

## 2012-01-03 ENCOUNTER — Encounter (HOSPITAL_BASED_OUTPATIENT_CLINIC_OR_DEPARTMENT_OTHER): Payer: Self-pay | Admitting: *Deleted

## 2012-01-03 NOTE — Progress Notes (Signed)
Has a cough-sinus drg-clear-no fever or chest congestion-can take allergy meds or benadryl,has cough meds-if fever or chest congestion-call dr Kathrin Greathouse come in day before surg for bmet-ekg

## 2012-01-04 ENCOUNTER — Encounter (HOSPITAL_BASED_OUTPATIENT_CLINIC_OR_DEPARTMENT_OTHER)
Admission: RE | Admit: 2012-01-04 | Discharge: 2012-01-04 | Disposition: A | Discharge status: Home / Self Care | Payer: Medicare Other | Source: Ambulatory Visit | Attending: Orthopedic Surgery | Admitting: Orthopedic Surgery

## 2012-01-04 LAB — BASIC METABOLIC PANEL
CO2: 29 mEq/L (ref 19–32)
GFR calc non Af Amer: 82 mL/min — ABNORMAL LOW (ref >90)
Glucose, Bld: 96 mg/dL (ref 70–99)
Potassium: 4.2 mEq/L (ref 3.5–5.1)
Sodium: 137 mEq/L (ref 135–145)

## 2012-01-05 NOTE — H&P (Signed)
[facility] [address]   [address] [place], [place] [address] [phone] A Division of [facility]  Loreta Ave, M.D.     Robert A. Thurston Hole, M.D.     Lunette Stands, M.D. Eulas Post, M.D.    Buford Dresser, M.D. Estell Harpin, M.D. Ralene Cork, D.O.          Genene Churn. Barry Dienes, PA-C            Kirstin A. Shepperson, PA-C [place], OPA-C   RE: Crystal Little, Crystal Little                                [id number]      DOB: 12-22-1940 PROGRESS NOTE: 11-09-11 Crystal Little comes in for evaluation of her right knee.  I haven't seen her since back in 2007.  New issue is her right knee.  No injury.  The last two weeks it has gotten steadily sore.  Tends to have retropatellar pain referred to the lateral joint line.  Some occasional catching.  No real buckling.  Some swelling towards the end of the day.  Getting more annoying.  Rest, alteration of activities and anti-inflammatories without improvement.  Presents for evaluation and recommendation. Entire history is reviewed, updated and included in the chart.  Of note, I treated her left knee with an arthroscopic debridement of meniscus tears and a chondroplasty patella back in 2007.  That continues to do well with no signs or symptoms.    EXAMINATION: General exam is outlined and included in the chart.  Height: 5?7.  Weight: 206 pounds.  Exogenous obesity.  Some deconditioning, not extreme.  A little bit of an antalgic gait on the right.  She has a negative log roll both hips.  Negative straight leg raise, both sides.  Neurovascularly intact.  The left knee has full motion.  A little tender along the medial joint line.  Stable ligaments.  Negative McMurray's.  On the right she has pain with patellar compression.  2+ patellofemoral crepitus on the right, 1+ on the left.  Increased Q angle both sides.  Lateral tracking, but not a lot of tethering.  The new right knee also has significant  tenderness lateral joint line.  Some pain with McMurray's.  Ligaments stable.  Full motion.  1-2+ reactive effusion.   X-RAYS: Four view standing x-rays show significant retropatellar degenerative change, right knee compared to left.  Some narrowing medial compartment on standing view of the left knee, not extreme.  The medial and lateral compartments of the right knee look good.   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  Could have a lateral meniscus tear, but I am doubting that at this point in time.    PLAN: I went over her findings, workup, treatment to date and her x-rays with her.  We are going to try an intraarticular Cortisone injection on the right.  Appropriate exercise program not challenging the patellofemoral joint.  If symptoms do not dramatically improve she will call and I will get an MRI scan to look for a meniscus tear.  If things improve nothing further.  Overall conditioning, exercise and weight loss discussed with her.    PROCEDURE NOTE: The patient's clinical condition is marked by substantial pain and/or significant functional disability.  Other conservative therapy has not provided relief, is contraindicated, or not appropriate.  There is a reasonable likelihood that injection will significantly improve the patient's pain and/or functional disability. After appropriate consent and under sterile technique intraarticular injection of the right knee with Depo-Medrol/Marcaine.  Tolerated this well.   Loreta Ave, M.D. Electronically verified by Loreta Ave, M.D. DFM:jjh  [facility] [address]   [address] [place], [place] [address] [phone] A Division of [facility]  Loreta Ave, M.D.     Robert A. Thurston Hole, M.D.     Lunette Stands, M.D. Eulas Post, M.D.    Buford Dresser, M.D. Estell Harpin, M.D. Ralene Cork, D.O.          Genene Churn. Barry Dienes, PA-C            Kirstin A. Shepperson, PA-C [place], OPA-C   RE: Crystal Little, Crystal Little                                [id number]      DOB: 1940-11-20 PROGRESS NOTE: 12-28-11 Crystal Little returns for follow up.  Recent MRI completed of the right knee.  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  New symptoms on the right laterally.  Her x-rays show relatively well preserved medial and lateral compartments, but a lot of changes in the patellofemoral joint.  Her MRI shows torn lateral meniscus which is what I was looking for.  I have gone over the report and the scan and discussed it with her at length.  More than 25 minutes spent covering all of this with her.  Plan is to proceed with exam under anesthesia, arthroscopy, care of her meniscus tear and chondroplasty.  The underlying degenerative changes have been reinforced and this is what we found in her other knee back in 2007.  Cautious outlook based on those changes, but I think debriding arthroscopy for meniscus tear will help her.  She understands and agrees.  Paperwork complete.  All questions answered.  I will see her at the time of operative intervention.    Loreta Ave, M.D.   Electronically verified by Loreta Ave, M.D. DFM:jjh D 12-28-11 T 12-29-11

## 2012-01-06 ENCOUNTER — Encounter (HOSPITAL_BASED_OUTPATIENT_CLINIC_OR_DEPARTMENT_OTHER): Payer: Self-pay | Admitting: *Deleted

## 2012-01-06 ENCOUNTER — Encounter (HOSPITAL_BASED_OUTPATIENT_CLINIC_OR_DEPARTMENT_OTHER)
Admission: RE | Disposition: A | Discharge status: Home / Self Care | Payer: Self-pay | Source: Ambulatory Visit | Attending: Orthopedic Surgery

## 2012-01-06 ENCOUNTER — Ambulatory Visit (HOSPITAL_BASED_OUTPATIENT_CLINIC_OR_DEPARTMENT_OTHER): Payer: Medicare Other | Admitting: *Deleted

## 2012-01-06 ENCOUNTER — Ambulatory Visit (HOSPITAL_BASED_OUTPATIENT_CLINIC_OR_DEPARTMENT_OTHER)
Admission: RE | Admit: 2012-01-06 | Discharge: 2012-01-06 | Disposition: A | Discharge status: Home / Self Care | Payer: Medicare Other | Source: Ambulatory Visit | Attending: Orthopedic Surgery | Admitting: Orthopedic Surgery

## 2012-01-06 DIAGNOSIS — S83289A Other tear of lateral meniscus, current injury, unspecified knee, initial encounter: Secondary | ICD-10-CM | POA: Insufficient documentation

## 2012-01-06 DIAGNOSIS — M2240 Chondromalacia patellae, unspecified knee: Secondary | ICD-10-CM | POA: Insufficient documentation

## 2012-01-06 DIAGNOSIS — IMO0002 Reserved for concepts with insufficient information to code with codable children: Secondary | ICD-10-CM | POA: Insufficient documentation

## 2012-01-06 DIAGNOSIS — X58XXXA Exposure to other specified factors, initial encounter: Secondary | ICD-10-CM | POA: Insufficient documentation

## 2012-01-06 DIAGNOSIS — Z01812 Encounter for preprocedural laboratory examination: Secondary | ICD-10-CM | POA: Insufficient documentation

## 2012-01-06 DIAGNOSIS — Z4789 Encounter for other orthopedic aftercare: Secondary | ICD-10-CM

## 2012-01-06 LAB — POCT HEMOGLOBIN-HEMACUE: Hemoglobin: 13.3 g/dL (ref 12.0–15.0)

## 2012-01-06 SURGERY — ARTHROSCOPY, KNEE, WITH MENISCUS REPAIR
Anesthesia: General | Site: Knee | Laterality: Right | Wound class: Clean

## 2012-01-06 MED ORDER — METOCLOPRAMIDE HCL 5 MG/ML IJ SOLN: 10.0000 mg | Freq: Once | INTRAMUSCULAR | Status: DC | PRN

## 2012-01-06 MED ORDER — METOCLOPRAMIDE HCL 5 MG/ML IJ SOLN
INTRAMUSCULAR | Status: DC | PRN
  Administered 2012-01-06: 10 mg via INTRAVENOUS

## 2012-01-06 MED ORDER — METHYLPREDNISOLONE ACETATE PF 80 MG/ML IJ SUSP
INTRAMUSCULAR | Status: DC | PRN
  Administered 2012-01-06: 12:00:00 via INTRA_ARTICULAR

## 2012-01-06 MED ORDER — SODIUM CHLORIDE 0.9 % IR SOLN
Status: DC | PRN
  Administered 2012-01-06: 3000 mL

## 2012-01-06 MED ORDER — HYDROCODONE-ACETAMINOPHEN 5-325 MG PO TABS
2.0000 | ORAL_TABLET | Freq: Once | ORAL | Status: AC | PRN
  Administered 2012-01-06: 2 via ORAL

## 2012-01-06 MED ORDER — CEFAZOLIN SODIUM 1-5 GM-% IV SOLN
1.0000 g | INTRAVENOUS | Status: AC
  Administered 2012-01-06: 2 g via INTRAVENOUS

## 2012-01-06 MED ORDER — BUPIVACAINE HCL (PF) 0.25 % IJ SOLN
INTRAMUSCULAR | Status: DC | PRN
  Administered 2012-01-06: 20 mL

## 2012-01-06 MED ORDER — ONDANSETRON HCL 4 MG/2ML IJ SOLN
INTRAMUSCULAR | Status: DC | PRN
  Administered 2012-01-06: 4 mg via INTRAVENOUS

## 2012-01-06 MED ORDER — FENTANYL CITRATE 0.05 MG/ML IJ SOLN
INTRAMUSCULAR | Status: DC | PRN
  Administered 2012-01-06 (×3): 25 ug via INTRAVENOUS
  Administered 2012-01-06: 50 ug via INTRAVENOUS

## 2012-01-06 MED ORDER — HYDROMORPHONE HCL PF 1 MG/ML IJ SOLN
0.2500 mg | INTRAMUSCULAR | Status: DC | PRN
  Administered 2012-01-06 (×3): 0.5 mg via INTRAVENOUS

## 2012-01-06 MED ORDER — LIDOCAINE HCL (CARDIAC) 20 MG/ML IV SOLN
INTRAVENOUS | Status: DC | PRN
  Administered 2012-01-06: 60 mg via INTRAVENOUS

## 2012-01-06 MED ORDER — DEXAMETHASONE SODIUM PHOSPHATE 4 MG/ML IJ SOLN
INTRAMUSCULAR | Status: DC | PRN
  Administered 2012-01-06: 10 mg via INTRAVENOUS

## 2012-01-06 MED ORDER — PROPOFOL 10 MG/ML IV EMUL
INTRAVENOUS | Status: DC | PRN
  Administered 2012-01-06: 100 mg via INTRAVENOUS
  Administered 2012-01-06: 200 mg via INTRAVENOUS

## 2012-01-06 MED ORDER — LACTATED RINGERS IV SOLN
INTRAVENOUS | Status: DC
  Administered 2012-01-06 (×2): via INTRAVENOUS

## 2012-01-06 MED FILL — Methylprednisolone Acetate Inj Susp 80 MG/ML: INTRAMUSCULAR | Qty: 80 | Status: AC

## 2012-01-06 SURGICAL SUPPLY — 39 items
BANDAGE ELASTIC 6 VELCRO ST LF (GAUZE/BANDAGES/DRESSINGS) ×2 IMPLANT
BLADE CUDA 5.5 (BLADE) IMPLANT
BLADE CUDA GRT WHITE 3.5 (BLADE) IMPLANT
BLADE CUTTER GATOR 3.5 (BLADE) ×2 IMPLANT
BLADE CUTTER MENIS 5.5 (BLADE) IMPLANT
BLADE GREAT WHITE 4.2 (BLADE) ×2 IMPLANT
BUR OVAL 4.0 (BURR) IMPLANT
CANISTER OMNI JUG 16 LITER (MISCELLANEOUS) ×2 IMPLANT
CANISTER SUCTION 2500CC (MISCELLANEOUS) IMPLANT
CLOTH BEACON ORANGE TIMEOUT ST (SAFETY) ×2 IMPLANT
CUTTER KNOT PUSHER 2-0 FIBERWI (INSTRUMENTS) IMPLANT
CUTTER MENISCUS  4.2MM (BLADE)
CUTTER MENISCUS 4.2MM (BLADE) IMPLANT
DRAPE ARTHROSCOPY W/POUCH 90 (DRAPES) ×2 IMPLANT
DURAPREP 26ML APPLICATOR (WOUND CARE) ×2 IMPLANT
ELECT MENISCUS 165MM 90D (ELECTRODE) ×2 IMPLANT
ELECT REM PT RETURN 9FT ADLT (ELECTROSURGICAL) ×2
ELECTRODE REM PT RTRN 9FT ADLT (ELECTROSURGICAL) ×1 IMPLANT
GAUZE XEROFORM 1X8 LF (GAUZE/BANDAGES/DRESSINGS) ×2 IMPLANT
GLOVE BIO SURGEON STRL SZ 6.5 (GLOVE) ×2 IMPLANT
GLOVE BIOGEL PI IND STRL 7.0 (GLOVE) ×1 IMPLANT
GLOVE BIOGEL PI IND STRL 8 (GLOVE) ×1 IMPLANT
GLOVE BIOGEL PI INDICATOR 7.0 (GLOVE) ×1
GLOVE BIOGEL PI INDICATOR 8 (GLOVE) ×1
GLOVE ORTHO TXT STRL SZ7.5 (GLOVE) ×4 IMPLANT
GOWN BRE IMP PREV XXLGXLNG (GOWN DISPOSABLE) ×2 IMPLANT
GOWN PREVENTION PLUS XLARGE (GOWN DISPOSABLE) ×4 IMPLANT
HOLDER KNEE FOAM BLUE (MISCELLANEOUS) ×2 IMPLANT
KNEE WRAP E Z 3 GEL PACK (MISCELLANEOUS) ×2 IMPLANT
PACK ARTHROSCOPY DSU (CUSTOM PROCEDURE TRAY) ×2 IMPLANT
PACK BASIN DAY SURGERY FS (CUSTOM PROCEDURE TRAY) ×2 IMPLANT
PENCIL BUTTON HOLSTER BLD 10FT (ELECTRODE) ×2 IMPLANT
SET ARTHROSCOPY TUBING (MISCELLANEOUS) ×1
SET ARTHROSCOPY TUBING LN (MISCELLANEOUS) ×1 IMPLANT
SPONGE GAUZE 4X4 12PLY (GAUZE/BANDAGES/DRESSINGS) ×4 IMPLANT
SUT ETHILON 3 0 PS 1 (SUTURE) ×2 IMPLANT
SUT VIC AB 3-0 FS2 27 (SUTURE) IMPLANT
TOWEL OR 17X24 6PK STRL BLUE (TOWEL DISPOSABLE) ×2 IMPLANT
WATER STERILE IRR 1000ML POUR (IV SOLUTION) ×2 IMPLANT

## 2012-01-06 NOTE — Brief Op Note (Signed)
01/06/2012  12:44 PM  PATIENT:  Crystal Little  71 y.o. female  PRE-OPERATIVE DIAGNOSIS:  right knee lateral meniscal tear  POST-OPERATIVE DIAGNOSIS:  right knee lateral meniscal tear  PROCEDURE:  Procedure(s) (LRB): KNEE ARTHROSCOPY WITH MENISCAL REPAIR (Right)  SURGEON:  Surgeon(s) and Role:    * Loreta Ave, MD - Primary  PHYSICIAN ASSISTANT: Zonia Kief M   ANESTHESIA:   general  EBL:  Total I/O In: 800 [I.V.:800] Out: -   SPECIMEN:  No Specimen  DISPOSITION OF SPECIMEN:  N/A  COUNTS:  YES  TOURNIQUET:  * No tourniquets in log *  PATIENT DISPOSITION:  PACU - hemodynamically stable.

## 2012-01-06 NOTE — Transfer of Care (Signed)
Immediate Anesthesia Transfer of Care Note  Patient: Crystal Little  Procedure(s) Performed: Procedure(s) (LRB): KNEE ARTHROSCOPY WITH MENISCAL REPAIR (Right)  Patient Location: PACU  Anesthesia Type: General  Level of Consciousness: awake, alert  and patient cooperative  Airway & Oxygen Therapy: Patient Spontanous Breathing and Patient connected to face mask oxygen  Post-op Assessment: Report given to PACU RN, Post -op Vital signs reviewed and stable and Patient moving all extremities  Post vital signs: Reviewed and stable  Complications: No apparent anesthesia complications

## 2012-01-06 NOTE — Anesthesia Postprocedure Evaluation (Signed)
Anesthesia Post Note  Patient: Crystal Little  Procedure(s) Performed: Procedure(s) (LRB): KNEE ARTHROSCOPY WITH MENISCAL REPAIR (Right)  Anesthesia type: general  Patient location: PACU  Post pain: Pain level controlled  Post assessment: Patient's Cardiovascular Status Stable  Last Vitals:  Filed Vitals:   01/06/12 1300  BP: 155/67  Pulse: 100  Temp:   Resp: 17    Post vital signs: Reviewed and stable  Level of consciousness: sedated  Complications: No apparent anesthesia complications

## 2012-01-06 NOTE — Discharge Instructions (Signed)

## 2012-01-06 NOTE — Interval H&P Note (Signed)
History and Physical Interval Note:  01/06/2012 7:53 AM  Crystal Little  has presented today for surgery, with the diagnosis of right knee lmt  The various methods of treatment have been discussed with the patient and family. After consideration of risks, benefits and other options for treatment, the patient has consented to  Procedure(s) (LRB): KNEE ARTHROSCOPY WITH MENISCAL REPAIR (Right) as a surgical intervention .  The patients' history has been reviewed, patient examined, no change in status, stable for surgery.  I have reviewed the patients' chart and labs.  Questions were answered to the patient's satisfaction.     [name]

## 2012-01-06 NOTE — Anesthesia Procedure Notes (Signed)
Procedure Name: LMA Insertion Date/Time: 01/06/2012 11:34 AM Performed by: Meyer Russel Pre-anesthesia Checklist: Patient identified, Emergency Drugs available, Suction available, Patient being monitored and Timeout performed Patient Re-evaluated:Patient Re-evaluated prior to inductionOxygen Delivery Method: Circle System Utilized Preoxygenation: Pre-oxygenation with 100% oxygen Intubation Type: IV induction Ventilation: Mask ventilation without difficulty LMA: LMA inserted LMA Size: 4.0 Number of attempts: 2 (#4 LMA Supreme inserted easily but unable to seat. Replaced with #4 LMA Unique easily) Airway Equipment and Method: bite block Placement Confirmation: positive ETCO2 and breath sounds checked- equal and bilateral Tube secured with: Tape Dental Injury: Teeth and Oropharynx as per pre-operative assessment

## 2012-01-06 NOTE — Anesthesia Preprocedure Evaluation (Addendum)
Anesthesia Evaluation  Patient identified by MRN, date of birth, ID band Patient awake    Reviewed: Allergy & Precautions, H&P , NPO status , Patient's Chart, lab work & pertinent test results, reviewed documented beta blocker date and time   Airway Mallampati: II TM Distance: >3 FB Neck ROM: full    Dental   Pulmonary neg pulmonary ROS,          Cardiovascular negative cardio ROS      Neuro/Psych negative neurological ROS  negative psych ROS   GI/Hepatic negative GI ROS, Neg liver ROS, GERD-  Medicated and Controlled,  Endo/Other  negative endocrine ROSMorbid obesity  Renal/GU negative Renal ROS  negative genitourinary   Musculoskeletal   Abdominal   Peds  Hematology negative hematology ROS (+)   Anesthesia Other Findings See surgeon's H&P   Reproductive/Obstetrics negative OB ROS                           Anesthesia Physical Anesthesia Plan  ASA: III  Anesthesia Plan: General   Post-op Pain Management:    Induction: Intravenous  Airway Management Planned: LMA  Additional Equipment:   Intra-op Plan:   Post-operative Plan: Extubation in OR  Informed Consent: I have reviewed the patients History and Physical, chart, labs and discussed the procedure including the risks, benefits and alternatives for the proposed anesthesia with the patient or authorized representative who has indicated his/her understanding and acceptance.   Dental Advisory Given  Plan Discussed with: CRNA and Surgeon  Anesthesia Plan Comments:        Anesthesia Quick Evaluation

## 2012-01-07 NOTE — Op Note (Signed)
NAME:  Crystal Little, Crystal Little NO.:  MEDICAL RECORD NO.:  [id number]  LOCATION:                                 FACILITY:  PHYSICIAN:  Loreta Ave, M.D.      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  PROCEDURE:  Right knee exam under anesthesia, arthroscopy.  Partial medial lateral meniscectomy.  Tricompartmental chondroplasty.  SURGEON: Loreta Ave, MD.  ASSISTANT:  Zonia Kief PA  ANESTHESIA:  General.  BLOOD LOSS:  Minimal.  SPECIMENS:  None.  COUNTS:  None.  COMPLICATION:  None.  DRESSINGS:  Soft, compressive.  TOURNIQUET:  Not employed.  PROCEDURE:  The patient brought to the operating room, placed on the operating table in supine position.  After adequate anesthesia been obtained, leg holder applied.  Leg prepped and draped in usual sterile fashion.  Two portals, 1 each medial lateral parapatellar.  Arthroscope introduced.  Knee distended and inspected.  Extensive grade 3 and 4 changes over most patellofemoral joint.  Chondroplasty on either surfaces.  Cruciate ligaments intact.  Medially flap tear off the posterior horn at the attachment, tapered from that area and meniscus removing part of the posterior third.  Some grade 2 changes not extensive.  Laterally grade 2 changes, grade 3 on the condyle.  Marked tearing of the posterior third with detachment at the posterior attachment.  Partially, discoid meniscus with radial tearing throughout. This was saucerized out to a stable rim all way around.  Still leaving a fair  amount meniscus in the anterior middle third completion.  Entire knee examined.  No other significant findings appreciated.  Instruments were removed.  Portals of knee injected Marcaine.  Portals closed with 4-0 nylon.  Knee injected with Depo-Medrol and Marcaine.  Anesthesia reversed.  Brought to recovery room.  Tolerated surgery well.  No complications.     Loreta Ave, M.D.     DFM/MEDQ  D:  01/06/2012  T:  01/07/2012  Job:  [id number]

## 2012-01-12 ENCOUNTER — Encounter: Payer: Self-pay | Admitting: Family Medicine

## 2012-01-12 ENCOUNTER — Ambulatory Visit (INDEPENDENT_AMBULATORY_CARE_PROVIDER_SITE_OTHER): Payer: Medicare Other | Admitting: Family Medicine

## 2012-01-12 VITALS — BP 120/70 | HR 99 | Temp 98.6°F | Wt 230.0 lb

## 2012-01-12 DIAGNOSIS — J40 Bronchitis, not specified as acute or chronic: Secondary | ICD-10-CM

## 2012-01-12 MED ORDER — AZITHROMYCIN 250 MG PO TABS: ORAL_TABLET | ORAL | Status: AC

## 2012-01-12 MED ORDER — HYDROCODONE-HOMATROPINE 5-1.5 MG/5ML PO SYRP: 5.0000 mL | ORAL_SOLUTION | ORAL | Status: AC | PRN

## 2012-01-12 NOTE — Progress Notes (Signed)
  Subjective:    Patient ID: Crystal Little, female    DOB: 05/12/1941, 71 y.o.   MRN: [id number]  HPI Here for 2 weeks of chest congestion and coughing up green sputum. No fever  Review of Systems  Constitutional: Negative.   HENT: Negative.   Eyes: Negative.   Respiratory: Positive for cough and chest tightness.        Objective:   Physical Exam  Constitutional: She appears well-developed and well-nourished.  HENT:  Right Ear: External ear normal.  Left Ear: External ear normal.  Nose: Nose normal.  Mouth/Throat: Oropharynx is clear and moist. No oropharyngeal exudate.  Eyes: Conjunctivae are normal.  Pulmonary/Chest: Effort normal and breath sounds normal.  Lymphadenopathy:    She has no cervical adenopathy.          Assessment & Plan:  Add Mucinex

## 2012-03-07 ENCOUNTER — Telehealth: Payer: Self-pay | Admitting: Family Medicine

## 2012-03-07 MED ORDER — FUROSEMIDE 20 MG PO TABS: 20.0000 mg | ORAL_TABLET | Freq: Every day | ORAL | Status: DC

## 2012-03-07 NOTE — Telephone Encounter (Signed)
Pt requested a 90 day supply of Furosemide and I did send script e-scribe.

## 2012-03-23 ENCOUNTER — Other Ambulatory Visit: Payer: Self-pay | Admitting: Family Medicine

## 2012-06-28 ENCOUNTER — Other Ambulatory Visit: Payer: Self-pay | Admitting: Family Medicine

## 2012-06-28 DIAGNOSIS — Z1231 Encounter for screening mammogram for malignant neoplasm of breast: Secondary | ICD-10-CM

## 2012-08-04 ENCOUNTER — Ambulatory Visit: Payer: Medicare Other

## 2012-08-25 ENCOUNTER — Ambulatory Visit
Admission: RE | Admit: 2012-08-25 | Discharge: 2012-08-25 | Disposition: A | Discharge status: Home / Self Care | Payer: Medicare Other | Source: Ambulatory Visit | Attending: Family Medicine | Admitting: Family Medicine

## 2012-08-25 DIAGNOSIS — Z1231 Encounter for screening mammogram for malignant neoplasm of breast: Secondary | ICD-10-CM

## 2012-11-09 ENCOUNTER — Other Ambulatory Visit: Payer: Self-pay | Admitting: Family Medicine

## 2013-04-05 ENCOUNTER — Other Ambulatory Visit: Payer: Self-pay | Admitting: Family Medicine

## 2013-04-09 ENCOUNTER — Other Ambulatory Visit: Payer: Self-pay | Admitting: Family Medicine

## 2013-05-07 ENCOUNTER — Telehealth: Payer: Self-pay | Admitting: Family Medicine

## 2013-05-07 MED ORDER — OMEPRAZOLE 40 MG PO CPDR: 40.0000 mg | DELAYED_RELEASE_CAPSULE | Freq: Every day | ORAL | Status: DC

## 2013-05-07 NOTE — Telephone Encounter (Signed)
Refill request for Omeprazole and a 90 day supply, which I did send e-scribe.

## 2013-06-20 ENCOUNTER — Encounter: Payer: Self-pay | Admitting: Family Medicine

## 2013-06-20 ENCOUNTER — Ambulatory Visit (INDEPENDENT_AMBULATORY_CARE_PROVIDER_SITE_OTHER): Payer: Medicare Other | Admitting: Family Medicine

## 2013-06-20 VITALS — BP 122/70 | HR 80 | Temp 98.4°F | Ht 66.0 in | Wt 220.0 lb

## 2013-06-20 DIAGNOSIS — K219 Gastro-esophageal reflux disease without esophagitis: Secondary | ICD-10-CM

## 2013-06-20 DIAGNOSIS — Z136 Encounter for screening for cardiovascular disorders: Secondary | ICD-10-CM

## 2013-06-20 DIAGNOSIS — Z23 Encounter for immunization: Secondary | ICD-10-CM

## 2013-06-20 DIAGNOSIS — Z0000 Encounter for general adult medical examination without abnormal findings: Secondary | ICD-10-CM

## 2013-06-20 LAB — BASIC METABOLIC PANEL
CO2: 31 mEq/L (ref 19–32)
Calcium: 9.2 mg/dL (ref 8.4–10.5)
Chloride: 102 mEq/L (ref 96–112)
Creatinine, Ser: 0.9 mg/dL (ref 0.4–1.2)
Glucose, Bld: 91 mg/dL (ref 70–99)

## 2013-06-20 LAB — CBC WITH DIFFERENTIAL/PLATELET
Basophils Absolute: 0 10*3/uL (ref 0.0–0.1)
Eosinophils Absolute: 0.2 10*3/uL (ref 0.0–0.7)
HCT: 40.4 % (ref 36.0–46.0)
Lymphs Abs: 2.3 10*3/uL (ref 0.7–4.0)
MCHC: 33.2 g/dL (ref 30.0–36.0)
MCV: 91 fl (ref 78.0–100.0)
Monocytes Absolute: 0.7 10*3/uL (ref 0.1–1.0)
Neutrophils Relative %: 57.3 % (ref 43.0–77.0)
Platelets: 264 10*3/uL (ref 150.0–400.0)
RDW: 14.6 % (ref 11.5–14.6)

## 2013-06-20 LAB — POCT URINALYSIS DIPSTICK
Blood, UA: NEGATIVE
Glucose, UA: NEGATIVE
Nitrite, UA: NEGATIVE
Protein, UA: NEGATIVE
Spec Grav, UA: 1.01
Urobilinogen, UA: 0.2

## 2013-06-20 LAB — HEPATIC FUNCTION PANEL
ALT: 13 U/L (ref 0–35)
Albumin: 4 g/dL (ref 3.5–5.2)
Alkaline Phosphatase: 66 U/L (ref 39–117)
Bilirubin, Direct: 0 mg/dL (ref 0.0–0.3)
Total Protein: 7.6 g/dL (ref 6.0–8.3)

## 2013-06-20 LAB — LIPID PANEL
HDL: 56.3 mg/dL (ref >39.00)
Total CHOL/HDL Ratio: 3
Triglycerides: 74 mg/dL (ref 0.0–149.0)

## 2013-06-20 MED ORDER — FUROSEMIDE 20 MG PO TABS: ORAL_TABLET | ORAL | Status: DC

## 2013-06-20 NOTE — Progress Notes (Signed)
  Subjective:    Patient ID: Crystal Little, female    DOB: [birth year], 72 y.o.   MRN: [id number]  HPI 72 yr old female for a cpx. She feels well.    Review of Systems  Constitutional: Negative.   HENT: Negative.   Eyes: Negative.   Respiratory: Negative.   Cardiovascular: Negative.   Gastrointestinal: Negative.   Genitourinary: Negative for dysuria, urgency, frequency, hematuria, flank pain, decreased urine volume, enuresis, difficulty urinating, pelvic pain and dyspareunia.  Musculoskeletal: Negative.   Skin: Negative.   Neurological: Negative.   Psychiatric/Behavioral: Negative.        Objective:   Physical Exam  Constitutional: She is oriented to person, place, and time. She appears well-developed and well-nourished. No distress.  HENT:  Head: Normocephalic and atraumatic.  Right Ear: External ear normal.  Left Ear: External ear normal.  Nose: Nose normal.  Mouth/Throat: Oropharynx is clear and moist. No oropharyngeal exudate.  Eyes: Conjunctivae and EOM are normal. Pupils are equal, round, and reactive to light. No scleral icterus.  Neck: Normal range of motion. Neck supple. No JVD present. No thyromegaly present.  Cardiovascular: Normal rate, regular rhythm, normal heart sounds and intact distal pulses.  Exam reveals no gallop and no friction rub.   No murmur heard. EKG normal   Pulmonary/Chest: Effort normal and breath sounds normal. No respiratory distress. She has no wheezes. She has no rales. She exhibits no tenderness.  Abdominal: Soft. Bowel sounds are normal. She exhibits no distension and no mass. There is no tenderness. There is no rebound and no guarding.  Musculoskeletal: Normal range of motion. She exhibits no edema and no tenderness.  Lymphadenopathy:    She has no cervical adenopathy.  Neurological: She is alert and oriented to person, place, and time. She has normal reflexes. No cranial nerve deficit. She exhibits normal muscle tone. Coordination  normal.  Skin: Skin is warm and dry. No rash noted. No erythema.  Psychiatric: She has a normal mood and affect. Her behavior is normal. Judgment and thought content normal.          Assessment & Plan:  Well exam. Get fasting labs

## 2013-06-22 NOTE — Progress Notes (Signed)
Quick Note:  I released results in my chart and spoke with pt. ______

## 2013-06-25 ENCOUNTER — Encounter: Payer: Self-pay | Admitting: Family Medicine

## 2013-06-26 MED ORDER — HYDROCODONE-HOMATROPINE 5-1.5 MG/5ML PO SYRP: 5.0000 mL | ORAL_SOLUTION | ORAL | Status: DC | PRN

## 2014-06-24 ENCOUNTER — Ambulatory Visit (INDEPENDENT_AMBULATORY_CARE_PROVIDER_SITE_OTHER): Payer: Medicare Other | Admitting: Family Medicine

## 2014-06-24 ENCOUNTER — Encounter: Payer: Self-pay | Admitting: Family Medicine

## 2014-06-24 VITALS — BP 135/86 | HR 82 | Temp 98.8°F | Ht 65.5 in | Wt 244.0 lb

## 2014-06-24 DIAGNOSIS — Z23 Encounter for immunization: Secondary | ICD-10-CM

## 2014-06-24 DIAGNOSIS — Z79899 Other long term (current) drug therapy: Secondary | ICD-10-CM

## 2014-06-24 DIAGNOSIS — Z0000 Encounter for general adult medical examination without abnormal findings: Secondary | ICD-10-CM

## 2014-06-24 LAB — POCT URINALYSIS DIPSTICK
BILIRUBIN UA: NEGATIVE
Glucose, UA: NEGATIVE
KETONES UA: NEGATIVE
NITRITE UA: NEGATIVE
Protein, UA: NEGATIVE
RBC UA: NEGATIVE
Spec Grav, UA: 1.02
Urobilinogen, UA: 0.2
pH, UA: 5.5

## 2014-06-24 LAB — BASIC METABOLIC PANEL
BUN: 15 mg/dL (ref 6–23)
CO2: 30 mEq/L (ref 19–32)
Calcium: 9.2 mg/dL (ref 8.4–10.5)
Chloride: 104 mEq/L (ref 96–112)
Creatinine, Ser: 0.9 mg/dL (ref 0.4–1.2)
GFR: 68.41 mL/min (ref >60.00)
Glucose, Bld: 93 mg/dL (ref 70–99)
POTASSIUM: 4.4 meq/L (ref 3.5–5.1)
SODIUM: 141 meq/L (ref 135–145)

## 2014-06-24 LAB — LIPID PANEL
CHOL/HDL RATIO: 3
Cholesterol: 182 mg/dL (ref 0–200)
HDL: 55.7 mg/dL (ref >39.00)
LDL CALC: 107 mg/dL — AB (ref 0–99)
NONHDL: 126.3
Triglycerides: 96 mg/dL (ref 0.0–149.0)
VLDL: 19.2 mg/dL (ref 0.0–40.0)

## 2014-06-24 LAB — CBC WITH DIFFERENTIAL/PLATELET
Basophils Absolute: 0 10*3/uL (ref 0.0–0.1)
Basophils Relative: 0.5 % (ref 0.0–3.0)
EOS PCT: 2.3 % (ref 0.0–5.0)
Eosinophils Absolute: 0.2 10*3/uL (ref 0.0–0.7)
HCT: 39.3 % (ref 36.0–46.0)
Hemoglobin: 13.1 g/dL (ref 12.0–15.0)
LYMPHS PCT: 25.7 % (ref 12.0–46.0)
Lymphs Abs: 2.1 10*3/uL (ref 0.7–4.0)
MCHC: 33.2 g/dL (ref 30.0–36.0)
MCV: 90.3 fl (ref 78.0–100.0)
Monocytes Absolute: 0.7 10*3/uL (ref 0.1–1.0)
Monocytes Relative: 8.2 % (ref 3.0–12.0)
NEUTROS ABS: 5.1 10*3/uL (ref 1.4–7.7)
NEUTROS PCT: 63.3 % (ref 43.0–77.0)
Platelets: 285 10*3/uL (ref 150.0–400.0)
RBC: 4.36 Mil/uL (ref 3.87–5.11)
RDW: 14.5 % (ref 11.5–15.5)
WBC: 8 10*3/uL (ref 4.0–10.5)

## 2014-06-24 LAB — HEPATIC FUNCTION PANEL
ALBUMIN: 4 g/dL (ref 3.5–5.2)
ALK PHOS: 69 U/L (ref 39–117)
ALT: 16 U/L (ref 0–35)
AST: 20 U/L (ref 0–37)
BILIRUBIN TOTAL: 0.7 mg/dL (ref 0.2–1.2)
Bilirubin, Direct: 0 mg/dL (ref 0.0–0.3)
Total Protein: 8 g/dL (ref 6.0–8.3)

## 2014-06-24 LAB — TSH: TSH: 3.43 u[IU]/mL (ref 0.35–4.50)

## 2014-06-24 MED ORDER — OMEPRAZOLE 40 MG PO CPDR: 40.0000 mg | DELAYED_RELEASE_CAPSULE | Freq: Every day | ORAL | Status: DC

## 2014-06-24 MED ORDER — TEMAZEPAM 15 MG PO CAPS: 15.0000 mg | ORAL_CAPSULE | Freq: Every evening | ORAL | Status: DC | PRN

## 2014-06-24 NOTE — Progress Notes (Signed)
   Subjective:    Patient ID: Crystal Little, female    DOB: 07-08-1941, 73 y.o.   MRN: [id number]  HPI 73 yr old female for a cpx. She feels well. She does have some trouble sleeping however. She falls asleep easily but often wakes up during the night.    Review of Systems  Constitutional: Negative.   HENT: Negative.   Eyes: Negative.   Respiratory: Negative.   Cardiovascular: Negative.   Gastrointestinal: Negative.   Genitourinary: Negative for dysuria, urgency, frequency, hematuria, flank pain, decreased urine volume, enuresis, difficulty urinating, pelvic pain and dyspareunia.  Musculoskeletal: Negative.   Skin: Negative.   Neurological: Negative.   Psychiatric/Behavioral: Positive for sleep disturbance. Negative for hallucinations, behavioral problems, confusion, dysphoric mood, decreased concentration and agitation. The patient is not nervous/anxious and is not hyperactive.        Objective:   Physical Exam  Constitutional: She is oriented to person, place, and time. She appears well-developed and well-nourished. No distress.  HENT:  Head: Normocephalic and atraumatic.  Right Ear: External ear normal.  Left Ear: External ear normal.  Nose: Nose normal.  Mouth/Throat: Oropharynx is clear and moist. No oropharyngeal exudate.  Eyes: Conjunctivae and EOM are normal. Pupils are equal, round, and reactive to light. No scleral icterus.  Neck: Normal range of motion. Neck supple. No JVD present. No thyromegaly present.  Cardiovascular: Normal rate, regular rhythm, normal heart sounds and intact distal pulses.  Exam reveals no gallop and no friction rub.   No murmur heard. EKG normal   Pulmonary/Chest: Effort normal and breath sounds normal. No respiratory distress. She has no wheezes. She has no rales. She exhibits no tenderness.  Abdominal: Soft. Bowel sounds are normal. She exhibits no distension and no mass. There is no tenderness. There is no rebound and no guarding.    Musculoskeletal: Normal range of motion. She exhibits no edema and no tenderness.  Lymphadenopathy:    She has no cervical adenopathy.  Neurological: She is alert and oriented to person, place, and time. She has normal reflexes. No cranial nerve deficit. She exhibits normal muscle tone. Coordination normal.  Skin: Skin is warm and dry. No rash noted. No erythema.  Psychiatric: She has a normal mood and affect. Her behavior is normal. Judgment and thought content normal.          Assessment & Plan:  Well exam. Get fasting labs. Try Temazepam for sleep.

## 2014-06-24 NOTE — Progress Notes (Signed)
Pre visit review using our clinic review tool, if applicable. No additional management support is needed unless otherwise documented below in the visit note. 

## 2014-06-24 NOTE — Addendum Note (Signed)
Addended by: Aggie Hacker A on: 06/24/2014 11:14 AM   Modules accepted: Orders

## 2014-11-01 DIAGNOSIS — H43813 Vitreous degeneration, bilateral: Secondary | ICD-10-CM | POA: Diagnosis not present

## 2014-11-01 DIAGNOSIS — Z961 Presence of intraocular lens: Secondary | ICD-10-CM | POA: Diagnosis not present

## 2014-11-01 DIAGNOSIS — H40013 Open angle with borderline findings, low risk, bilateral: Secondary | ICD-10-CM | POA: Diagnosis not present

## 2014-11-01 DIAGNOSIS — H26492 Other secondary cataract, left eye: Secondary | ICD-10-CM | POA: Diagnosis not present

## 2014-12-21 ENCOUNTER — Other Ambulatory Visit: Payer: Self-pay | Admitting: Family Medicine

## 2014-12-23 NOTE — Telephone Encounter (Signed)
Should pt be taking this medication, looks like it has been awhile since last filled?

## 2015-01-28 ENCOUNTER — Encounter: Payer: Self-pay | Admitting: Gastroenterology

## 2015-02-17 ENCOUNTER — Encounter: Payer: Self-pay | Admitting: Gastroenterology

## 2015-02-17 ENCOUNTER — Ambulatory Visit (INDEPENDENT_AMBULATORY_CARE_PROVIDER_SITE_OTHER): Payer: Medicare Other | Admitting: Gastroenterology

## 2015-02-17 VITALS — BP 124/60 | HR 76 | Ht 64.5 in | Wt 241.0 lb

## 2015-02-17 DIAGNOSIS — R1310 Dysphagia, unspecified: Secondary | ICD-10-CM

## 2015-02-17 DIAGNOSIS — R1314 Dysphagia, pharyngoesophageal phase: Secondary | ICD-10-CM | POA: Diagnosis not present

## 2015-02-17 NOTE — Progress Notes (Signed)
_                                                                                                                History of Present Illness:  Crystal Little is a 74 year old white female with history of esophageal stricture referred at the request of Dr. Sarajane Jews for evaluation of dysphagia.  She has both a globus sensation and dysphagia to solids.  She denies pyrosis.  Weight has been stable.  She also denies abdominal pain.  An esophageal stricture was dilated in 2011.  Colonoscopy in 2012 demonstrated diverticulosis.   Past Medical History  Diagnosis Date  . GERD (gastroesophageal reflux disease)   . Osteoarthritis   . IBS (irritable bowel syndrome)   . Leg edema   . Frequent UTI     sees Dr. Rosana Hoes  . Gynecological examination     sees Dr. Sander Radon  . Varicose veins of legs   . Diverticulosis   . Status post dilation of esophageal narrowing    Past Surgical History  Procedure Laterality Date  . Tonsillectomy    . Right knee arthroscopy  12/17/05 and 01-06-12    per Dr. Percell Miller  . Endovascular repair of bilateral leg varicosities      per Dr. Jones Skene   . Esophagogastroduodenoscopy  01-19-10    per Dr. Sharlett Iles, with dilatation   . Esophagogastroduodenoscopy (egd) with esophageal dilation    . Colonoscopy  04-26-11    per Dr. Sharlett Iles, diverticulosis only, no repeats needed   family history includes Diabetes in her maternal aunt and maternal uncle; Heart disease in her paternal grandfather; Irritable bowel syndrome in her mother; Pancreatic cancer in her paternal grandmother. There is no history of Colon cancer, Colon polyps, or Kidney disease. Current Outpatient Prescriptions  Medication Sig Dispense Refill  . aspirin 81 MG tablet Take 81 mg by mouth daily.      . Calcium Carbonate-Vit D-Min (CALCIUM 1200 PO) Take 2 tablets by mouth daily.    . Cholecalciferol (VITAMIN D) 2000 UNITS CAPS Take by mouth.      . furosemide (LASIX) 20 MG tablet TAKE 1 TABLET BY  MOUTH EVERY DAY 90 tablet 3  . GLUCOSAMINE SULFATE PO Take 2,000 mg by mouth 2 (two) times daily.    . Naproxen Sodium (ALEVE PO) Take 2 tablets by mouth as needed. Pt uses for leg ache from varicose veins    . Omega-3 Fatty Acids (FISH OIL) 1000 MG CAPS Take 2 capsules by mouth daily.    Marland Kitchen omeprazole (PRILOSEC) 40 MG capsule Take 1 capsule (40 mg total) by mouth daily. 90 capsule 3   No current facility-administered medications for this visit.   Allergies as of 02/17/2015  . (No Known Allergies)    reports that she has never smoked. She has never used smokeless tobacco. She reports that she drinks alcohol. She reports that she does not use illicit drugs.   Review of Systems: Pertinent positive and negative review of systems were noted in the above  HPI section. All other review of systems were otherwise negative.  Vital signs were reviewed in today's medical record Physical Exam: General: Well developed , well nourished, no acute distress Skin: anicteric Head: Normocephalic and atraumatic Eyes:  sclerae anicteric, EOMI Ears: Normal auditory acuity Mouth: No deformity or lesions Neck: Supple, no masses or thyromegaly Lymph Nodes: no lymphadenopathy Lungs: Clear throughout to auscultation Heart: Regular rate and rhythm; no murmurs, rubs or bruits Gastroinestinal: Soft, non tender and non distended. No masses, hepatosplenomegaly or hernias noted. Normal Bowel sounds Rectal:deferred Musculoskeletal: Symmetrical with no gross deformities .  There is trace pedal edema Skin: No lesions on visible extremities Pulses:  Normal pulses noted Extremities: No clubbing, cyanosis,  or deformities noted Neurological: Alert oriented x 4, grossly nonfocal Cervical Nodes:  No significant cervical adenopathy Inguinal Nodes: No significant inguinal adenopathy Psychological:  Alert and cooperative. Normal mood and affect  See Assessment and Plan under Problem List

## 2015-02-17 NOTE — Patient Instructions (Signed)

## 2015-02-17 NOTE — Assessment & Plan Note (Addendum)
Dysphagia likely is secondary to a recurrent esophageal stricture.  She also has a globus sensation.  Mediations #1 upper endoscopy with dilation as indicated  cc Dr. Nydia Bouton

## 2015-02-18 ENCOUNTER — Encounter: Payer: Self-pay | Admitting: Gastroenterology

## 2015-02-18 ENCOUNTER — Ambulatory Visit (AMBULATORY_SURGERY_CENTER): Payer: Medicare Other | Admitting: Gastroenterology

## 2015-02-18 VITALS — BP 123/54 | HR 81 | Temp 98.4°F | Resp 18 | Ht 64.5 in | Wt 241.0 lb

## 2015-02-18 DIAGNOSIS — R1314 Dysphagia, pharyngoesophageal phase: Secondary | ICD-10-CM

## 2015-02-18 DIAGNOSIS — K222 Esophageal obstruction: Secondary | ICD-10-CM

## 2015-02-18 MED ORDER — SODIUM CHLORIDE 0.9 % IV SOLN: 500.0000 mL | INTRAVENOUS | Status: DC

## 2015-02-18 NOTE — Patient Instructions (Signed)
Impressions/recommendations:  Stricture (handout given) Dilation diet (handout given)  YOU HAD AN ENDOSCOPIC PROCEDURE TODAY AT [place]:   Refer to the procedure report that was given to you for any specific questions about what was found during the examination.  If the procedure report does not answer your questions, please call your gastroenterologist to clarify.  If you requested that your care partner not be given the details of your procedure findings, then the procedure report has been included in a sealed envelope for you to review at your convenience later.  YOU SHOULD EXPECT: Some feelings of bloating in the abdomen. Passage of more gas than usual.  Walking can help get rid of the air that was put into your GI tract during the procedure and reduce the bloating. If you had a lower endoscopy (such as a colonoscopy or flexible sigmoidoscopy) you may notice spotting of blood in your stool or on the toilet paper. If you underwent a bowel prep for your procedure, you may not have a normal bowel movement for a few days.  Please Note:  You might notice some irritation and congestion in your nose or some drainage.  This is from the oxygen used during your procedure.  There is no need for concern and it should clear up in a day or so.  SYMPTOMS TO REPORT IMMEDIATELY:   Following upper endoscopy (EGD)  Vomiting of blood or coffee ground material  New chest pain or pain under the shoulder blades  Painful or persistently difficult swallowing  New shortness of breath  Fever of 100F or higher  Black, tarry-looking stools  For urgent or emergent issues, a gastroenterologist can be reached at any hour by calling [phone].   DIET: Your first meal following the procedure should be a small meal and then it is ok to progress to your normal diet. Heavy or fried foods are harder to digest and may make you feel nauseous or bloated.  Likewise, meals heavy in dairy and vegetables  can increase bloating.  Drink plenty of fluids but you should avoid alcoholic beverages for 24 hours.  ACTIVITY:  You should plan to take it easy for the rest of today and you should NOT DRIVE or use heavy machinery until tomorrow (because of the sedation medicines used during the test).    FOLLOW UP: Our staff will call the number listed on your records the next business day following your procedure to check on you and address any questions or concerns that you may have regarding the information given to you following your procedure. If we do not reach you, we will leave a message.  However, if you are feeling well and you are not experiencing any problems, there is no need to return our call.  We will assume that you have returned to your regular daily activities without incident.  If any biopsies were taken you will be contacted by phone or by letter within the next 1-3 weeks.  Please call us at [phone] if you have not heard about the biopsies in 3 weeks.    SIGNATURES/CONFIDENTIALITY: You and/or your care partner have signed paperwork which will be entered into your electronic medical record.  These signatures attest to the fact that that the information above on your After Visit Summary has been reviewed and is understood.  Full responsibility of the confidentiality of this discharge information lies with you and/or your care-partner.

## 2015-02-18 NOTE — Progress Notes (Signed)
  [facility] Post-op Note  Patient: Crystal Little  Procedure(s) Performed: endoscopy with dilatation  Patient Location: [facility] - Recovery Area  Anesthesia Type: Deep Sedation/Propofol  Level of Consciousness: awake, oriented and patient cooperative  Airway and Oxygen Therapy: Patient Spontanous Breathing  Post-op Pain: none  Post-op Assessment:  Post-op Vital signs reviewed, Patient's Cardiovascular Status Stable, Respiratory Function Stable, Patent Airway, No signs of Nausea or vomiting and Pain level controlled  Post-op Vital Signs: Reviewed and stable  Complications: No apparent anesthesia complications  Sherill Mangen E 3:25 PM

## 2015-02-18 NOTE — Progress Notes (Signed)
Called to room to assist during endoscopic procedure.  Patient ID and intended procedure confirmed with present staff. Received instructions for my participation in the procedure from the performing physician.  

## 2015-02-18 NOTE — Op Note (Signed)
[facility]  [address]. [place], [address]   ENDOSCOPY PROCEDURE REPORT  PATIENT: Latina, Frank  MR#: [id number] BIRTHDATE: 04/11/1941 , 10  yrs. old GENDER: female ENDOSCOPIST: Inda Castle, MD REFERRED BY:  Alysia Penna, M.D. PROCEDURE DATE:  02/18/2015 PROCEDURE:  EGD, diagnostic and Maloney dilation of esophagus ASA CLASS:     Class II INDICATIONS:  dysphagia. MEDICATIONS: TOPICAL ANESTHETIC:  DESCRIPTION OF PROCEDURE: After the risks benefits and alternatives of the procedure were thoroughly explained, informed consent was obtained.  The LB [id number] endoscope was introduced through the mouth and advanced to the second portion of the duodenum , Without limitations.  The instrument was slowly withdrawn as the mucosa was fully examined.    ESOPHAGUS: There was a peptic stricture at the gastroesophageal junction.  The stricture was traversable.  The stricture was dilated using a 65mm (54Fr) Maloney dilator.  There was mild resistance and no heme Except for the findings listed, the EGD was otherwise normal. . Retroflexed views revealed no abnormalities. The scope was then withdrawn from the patient and the procedure completed.  COMPLICATIONS: There were no immediate complications.  ENDOSCOPIC IMPRESSION: There was a stricture at the gastroesophageal junction; The stricture was dilated using a 13mm (54Fr) Maloney dilator  RECOMMENDATIONS: Repeat dilation as needed  REPEAT EXAM:  eSigned:  Inda Castle, MD 02/18/2015 3:23 PM    CC:

## 2015-02-19 ENCOUNTER — Telehealth: Payer: Self-pay | Admitting: *Deleted

## 2015-02-19 NOTE — Telephone Encounter (Signed)
  Follow up Call-  Call back number 02/18/2015  Post procedure Call Back phone  # [phone]  Permission to leave phone message Yes     Patient questions:  Do you have a fever, pain , or abdominal swelling? No. Pain Score  0 *  Have you tolerated food without any problems? Yes.    Have you been able to return to your normal activities? Yes.    Do you have any questions about your discharge instructions: Diet   No. Medications  No. Follow up visit  No.  Do you have questions or concerns about your Care? No.  Actions: * If pain score is 4 or above: No action needed, pain <4.

## 2015-03-04 DIAGNOSIS — H40013 Open angle with borderline findings, low risk, bilateral: Secondary | ICD-10-CM | POA: Diagnosis not present

## 2015-03-27 ENCOUNTER — Encounter: Payer: Self-pay | Admitting: Gastroenterology

## 2015-06-05 DIAGNOSIS — L820 Inflamed seborrheic keratosis: Secondary | ICD-10-CM | POA: Diagnosis not present

## 2015-06-05 DIAGNOSIS — L570 Actinic keratosis: Secondary | ICD-10-CM | POA: Diagnosis not present

## 2015-06-05 DIAGNOSIS — D485 Neoplasm of uncertain behavior of skin: Secondary | ICD-10-CM | POA: Diagnosis not present

## 2015-06-24 DIAGNOSIS — L820 Inflamed seborrheic keratosis: Secondary | ICD-10-CM | POA: Diagnosis not present

## 2015-07-11 DIAGNOSIS — Z01419 Encounter for gynecological examination (general) (routine) without abnormal findings: Secondary | ICD-10-CM | POA: Diagnosis not present

## 2015-07-11 DIAGNOSIS — Z1231 Encounter for screening mammogram for malignant neoplasm of breast: Secondary | ICD-10-CM | POA: Diagnosis not present

## 2015-07-31 ENCOUNTER — Ambulatory Visit (INDEPENDENT_AMBULATORY_CARE_PROVIDER_SITE_OTHER): Payer: Medicare Other

## 2015-07-31 DIAGNOSIS — Z23 Encounter for immunization: Secondary | ICD-10-CM | POA: Diagnosis not present

## 2015-08-04 ENCOUNTER — Other Ambulatory Visit: Payer: Self-pay | Admitting: Family Medicine

## 2015-08-11 DIAGNOSIS — H40013 Open angle with borderline findings, low risk, bilateral: Secondary | ICD-10-CM | POA: Diagnosis not present

## 2015-08-20 ENCOUNTER — Other Ambulatory Visit (INDEPENDENT_AMBULATORY_CARE_PROVIDER_SITE_OTHER): Payer: Medicare Other

## 2015-08-20 DIAGNOSIS — Z0000 Encounter for general adult medical examination without abnormal findings: Secondary | ICD-10-CM

## 2015-08-20 LAB — BASIC METABOLIC PANEL
BUN: 14 mg/dL (ref 6–23)
CALCIUM: 9.5 mg/dL (ref 8.4–10.5)
CO2: 30 mEq/L (ref 19–32)
CREATININE: 0.84 mg/dL (ref 0.40–1.20)
Chloride: 105 mEq/L (ref 96–112)
GFR: 70.08 mL/min (ref >60.00)
Glucose, Bld: 98 mg/dL (ref 70–99)
Potassium: 4.3 mEq/L (ref 3.5–5.1)
Sodium: 141 mEq/L (ref 135–145)

## 2015-08-20 LAB — CBC WITH DIFFERENTIAL/PLATELET
BASOS ABS: 0 10*3/uL (ref 0.0–0.1)
Basophils Relative: 0.4 % (ref 0.0–3.0)
EOS PCT: 2.5 % (ref 0.0–5.0)
Eosinophils Absolute: 0.2 10*3/uL (ref 0.0–0.7)
HCT: 37 % (ref 36.0–46.0)
Hemoglobin: 12.3 g/dL (ref 12.0–15.0)
LYMPHS ABS: 2.1 10*3/uL (ref 0.7–4.0)
LYMPHS PCT: 28.7 % (ref 12.0–46.0)
MCHC: 33.2 g/dL (ref 30.0–36.0)
MCV: 89.1 fl (ref 78.0–100.0)
MONO ABS: 0.6 10*3/uL (ref 0.1–1.0)
MONOS PCT: 8.6 % (ref 3.0–12.0)
NEUTROS ABS: 4.3 10*3/uL (ref 1.4–7.7)
NEUTROS PCT: 59.8 % (ref 43.0–77.0)
PLATELETS: 284 10*3/uL (ref 150.0–400.0)
RBC: 4.15 Mil/uL (ref 3.87–5.11)
RDW: 14.1 % (ref 11.5–15.5)
WBC: 7.2 10*3/uL (ref 4.0–10.5)

## 2015-08-20 LAB — LIPID PANEL
CHOL/HDL RATIO: 3
CHOLESTEROL: 163 mg/dL (ref 0–200)
HDL: 56.3 mg/dL (ref >39.00)
LDL Cholesterol: 91 mg/dL (ref 0–99)
NonHDL: 106.21
TRIGLYCERIDES: 77 mg/dL (ref 0.0–149.0)
VLDL: 15.4 mg/dL (ref 0.0–40.0)

## 2015-08-20 LAB — HEPATIC FUNCTION PANEL
ALK PHOS: 58 U/L (ref 39–117)
ALT: 11 U/L (ref 0–35)
AST: 17 U/L (ref 0–37)
Albumin: 3.8 g/dL (ref 3.5–5.2)
BILIRUBIN TOTAL: 0.7 mg/dL (ref 0.2–1.2)
Bilirubin, Direct: 0.1 mg/dL (ref 0.0–0.3)
Total Protein: 7.2 g/dL (ref 6.0–8.3)

## 2015-08-20 LAB — POCT URINALYSIS DIPSTICK
BILIRUBIN UA: NEGATIVE
Glucose, UA: NEGATIVE
Ketones, UA: NEGATIVE
NITRITE UA: NEGATIVE
PH UA: 7.5
Spec Grav, UA: 1.02
UROBILINOGEN UA: 0.2

## 2015-08-20 LAB — TSH: TSH: 3.98 u[IU]/mL (ref 0.35–4.50)

## 2015-08-27 ENCOUNTER — Encounter: Payer: Self-pay | Admitting: Family Medicine

## 2015-08-27 ENCOUNTER — Ambulatory Visit (INDEPENDENT_AMBULATORY_CARE_PROVIDER_SITE_OTHER): Payer: Medicare Other | Admitting: Family Medicine

## 2015-08-27 VITALS — BP 134/75 | HR 87 | Temp 98.0°F | Ht 64.5 in | Wt 248.0 lb

## 2015-08-27 DIAGNOSIS — Z0000 Encounter for general adult medical examination without abnormal findings: Secondary | ICD-10-CM | POA: Diagnosis not present

## 2015-08-27 MED ORDER — OMEPRAZOLE 40 MG PO CPDR: 40.0000 mg | DELAYED_RELEASE_CAPSULE | Freq: Every day | ORAL | Status: DC

## 2015-08-27 NOTE — Progress Notes (Signed)
   Subjective:    Patient ID: Crystal Little, female    DOB: Mar 13, 1941, 74 y.o.   MRN: [id number]  HPI 74 yr old female for a cpx. She is doing well.    Review of Systems  Constitutional: Negative.   HENT: Negative.   Eyes: Negative.   Respiratory: Negative.   Cardiovascular: Negative.   Gastrointestinal: Negative.   Genitourinary: Negative for dysuria, urgency, frequency, hematuria, flank pain, decreased urine volume, enuresis, difficulty urinating, pelvic pain and dyspareunia.  Musculoskeletal: Negative.   Skin: Negative.   Neurological: Negative.   Psychiatric/Behavioral: Negative.        Objective:   Physical Exam  Constitutional: She is oriented to person, place, and time. She appears well-developed and well-nourished. No distress.  HENT:  Head: Normocephalic and atraumatic.  Right Ear: External ear normal.  Left Ear: External ear normal.  Nose: Nose normal.  Mouth/Throat: Oropharynx is clear and moist. No oropharyngeal exudate.  Eyes: Conjunctivae and EOM are normal. Pupils are equal, round, and reactive to light. No scleral icterus.  Neck: Normal range of motion. Neck supple. No JVD present. No thyromegaly present.  Cardiovascular: Normal rate, regular rhythm, normal heart sounds and intact distal pulses.  Exam reveals no gallop and no friction rub.   No murmur heard. EKG normal   Pulmonary/Chest: Effort normal and breath sounds normal. No respiratory distress. She has no wheezes. She has no rales. She exhibits no tenderness.  Abdominal: Soft. Bowel sounds are normal. She exhibits no distension and no mass. There is no tenderness. There is no rebound and no guarding.  Musculoskeletal: Normal range of motion. She exhibits no edema or tenderness.  Lymphadenopathy:    She has no cervical adenopathy.  Neurological: She is alert and oriented to person, place, and time. She has normal reflexes. No cranial nerve deficit. She exhibits normal muscle tone. Coordination  normal.  Skin: Skin is warm and dry. No rash noted. No erythema.  Psychiatric: She has a normal mood and affect. Her behavior is normal. Judgment and thought content normal.          Assessment & Plan:  Well exam. We discussed diet and exercise advice.

## 2015-10-30 ENCOUNTER — Other Ambulatory Visit: Payer: Self-pay | Admitting: Family Medicine

## 2015-12-29 ENCOUNTER — Telehealth: Payer: Self-pay | Admitting: Family Medicine

## 2015-12-29 ENCOUNTER — Ambulatory Visit (INDEPENDENT_AMBULATORY_CARE_PROVIDER_SITE_OTHER): Payer: Medicare Other | Admitting: Family Medicine

## 2015-12-29 ENCOUNTER — Encounter: Payer: Self-pay | Admitting: Family Medicine

## 2015-12-29 VITALS — BP 131/70 | HR 85 | Temp 98.7°F | Ht 64.5 in | Wt 243.0 lb

## 2015-12-29 DIAGNOSIS — J019 Acute sinusitis, unspecified: Secondary | ICD-10-CM | POA: Diagnosis not present

## 2015-12-29 MED ORDER — AZITHROMYCIN 250 MG PO TABS: ORAL_TABLET | ORAL | Status: DC

## 2015-12-29 MED ORDER — HYDROCODONE-HOMATROPINE 5-1.5 MG/5ML PO SYRP: 5.0000 mL | ORAL_SOLUTION | ORAL | Status: DC | PRN

## 2015-12-29 NOTE — Progress Notes (Signed)
Pre visit review using our clinic review tool, if applicable. No additional management support is needed unless otherwise documented below in the visit note. 

## 2015-12-29 NOTE — Progress Notes (Signed)
   Subjective:    Patient ID: Crystal Little, female    DOB: 03-18-1941, 75 y.o.   MRN: [id number]  HPI Here for 5 days of sinus pressure, PND, and a dry cough. No fever. using Mucinex.   Review of Systems  Constitutional: Negative.   HENT: Positive for congestion, postnasal drip and sinus pressure. Negative for sore throat.   Eyes: Negative.   Respiratory: Positive for cough.        Objective:   Physical Exam  Constitutional: She appears well-developed and well-nourished.  HENT:  Right Ear: External ear normal.  Left Ear: External ear normal.  Nose: Nose normal.  Mouth/Throat: Oropharynx is clear and moist.  Eyes: Conjunctivae are normal.  Neck: No thyromegaly present.  Pulmonary/Chest: Effort normal and breath sounds normal.  Lymphadenopathy:    She has no cervical adenopathy.          Assessment & Plan:  Sinusitis, treat with a Zpack

## 2015-12-29 NOTE — Telephone Encounter (Signed)
Pt has been to 5 pharmacies and no one carries the HYDROcodone-homatropine (HYDROMET) 5-1.5 MG/5ML syrup  (It is on backorder, has been for months)

## 2015-12-30 NOTE — Telephone Encounter (Signed)
I spoke with pt and she has bought a bottle of over the counter Delsym for now to try. If cough doesn't get better she will call pharmacy first to ask what they do have and then give [place] a call.

## 2016-02-11 DIAGNOSIS — H40013 Open angle with borderline findings, low risk, bilateral: Secondary | ICD-10-CM | POA: Diagnosis not present

## 2016-02-11 DIAGNOSIS — H26493 Other secondary cataract, bilateral: Secondary | ICD-10-CM | POA: Diagnosis not present

## 2016-02-11 DIAGNOSIS — H43813 Vitreous degeneration, bilateral: Secondary | ICD-10-CM | POA: Diagnosis not present

## 2016-02-11 DIAGNOSIS — Z961 Presence of intraocular lens: Secondary | ICD-10-CM | POA: Diagnosis not present

## 2016-03-30 ENCOUNTER — Other Ambulatory Visit: Payer: Self-pay | Admitting: Family Medicine

## 2016-04-01 NOTE — Telephone Encounter (Signed)
Call in #[phone] with 5 rf 

## 2016-06-27 ENCOUNTER — Other Ambulatory Visit: Payer: Self-pay | Admitting: Family Medicine

## 2016-07-06 ENCOUNTER — Ambulatory Visit (INDEPENDENT_AMBULATORY_CARE_PROVIDER_SITE_OTHER): Payer: Medicare Other

## 2016-07-06 DIAGNOSIS — Z23 Encounter for immunization: Secondary | ICD-10-CM | POA: Diagnosis not present

## 2016-08-23 ENCOUNTER — Other Ambulatory Visit (INDEPENDENT_AMBULATORY_CARE_PROVIDER_SITE_OTHER): Payer: Medicare Other

## 2016-08-23 DIAGNOSIS — Z0000 Encounter for general adult medical examination without abnormal findings: Secondary | ICD-10-CM

## 2016-08-23 LAB — HEPATIC FUNCTION PANEL
ALBUMIN: 4 g/dL (ref 3.5–5.2)
ALT: 12 U/L (ref 0–35)
AST: 16 U/L (ref 0–37)
Alkaline Phosphatase: 65 U/L (ref 39–117)
BILIRUBIN TOTAL: 0.8 mg/dL (ref 0.2–1.2)
Bilirubin, Direct: 0.2 mg/dL (ref 0.0–0.3)
Total Protein: 7.4 g/dL (ref 6.0–8.3)

## 2016-08-23 LAB — CBC WITH DIFFERENTIAL/PLATELET
BASOS PCT: 0.5 % (ref 0.0–3.0)
Basophils Absolute: 0 10*3/uL (ref 0.0–0.1)
EOS PCT: 2.3 % (ref 0.0–5.0)
Eosinophils Absolute: 0.2 10*3/uL (ref 0.0–0.7)
HCT: 39.4 % (ref 36.0–46.0)
Hemoglobin: 13 g/dL (ref 12.0–15.0)
LYMPHS ABS: 2.1 10*3/uL (ref 0.7–4.0)
Lymphocytes Relative: 27 % (ref 12.0–46.0)
MCHC: 32.9 g/dL (ref 30.0–36.0)
MCV: 88.8 fl (ref 78.0–100.0)
MONO ABS: 0.7 10*3/uL (ref 0.1–1.0)
MONOS PCT: 8.5 % (ref 3.0–12.0)
NEUTROS ABS: 4.8 10*3/uL (ref 1.4–7.7)
NEUTROS PCT: 61.7 % (ref 43.0–77.0)
PLATELETS: 310 10*3/uL (ref 150.0–400.0)
RBC: 4.44 Mil/uL (ref 3.87–5.11)
RDW: 14.7 % (ref 11.5–15.5)
WBC: 7.8 10*3/uL (ref 4.0–10.5)

## 2016-08-23 LAB — BASIC METABOLIC PANEL
BUN: 19 mg/dL (ref 6–23)
CO2: 30 meq/L (ref 19–32)
Calcium: 9.7 mg/dL (ref 8.4–10.5)
Chloride: 104 mEq/L (ref 96–112)
Creatinine, Ser: 0.93 mg/dL (ref 0.40–1.20)
GFR: 62.14 mL/min (ref >60.00)
GLUCOSE: 92 mg/dL (ref 70–99)
POTASSIUM: 4.5 meq/L (ref 3.5–5.1)
SODIUM: 141 meq/L (ref 135–145)

## 2016-08-23 LAB — POC URINALSYSI DIPSTICK (AUTOMATED)
BILIRUBIN UA: NEGATIVE
Blood, UA: NEGATIVE
GLUCOSE UA: NEGATIVE
Ketones, UA: NEGATIVE
NITRITE UA: POSITIVE
Spec Grav, UA: 1.025
Urobilinogen, UA: 0.2
pH, UA: 5.5

## 2016-08-23 LAB — LIPID PANEL
CHOLESTEROL: 167 mg/dL (ref 0–200)
HDL: 61.1 mg/dL (ref >39.00)
LDL Cholesterol: 91 mg/dL (ref 0–99)
NONHDL: 105.4
Total CHOL/HDL Ratio: 3
Triglycerides: 70 mg/dL (ref 0.0–149.0)
VLDL: 14 mg/dL (ref 0.0–40.0)

## 2016-08-23 LAB — TSH: TSH: 2.85 u[IU]/mL (ref 0.35–4.50)

## 2016-08-30 ENCOUNTER — Encounter: Payer: Medicare Other | Admitting: Family Medicine

## 2016-09-03 ENCOUNTER — Encounter: Payer: Self-pay | Admitting: Family Medicine

## 2016-09-03 ENCOUNTER — Ambulatory Visit (INDEPENDENT_AMBULATORY_CARE_PROVIDER_SITE_OTHER): Payer: Medicare Other | Admitting: Family Medicine

## 2016-09-03 VITALS — BP 124/72 | HR 82 | Temp 97.9°F | Ht 66.0 in | Wt 241.0 lb

## 2016-09-03 DIAGNOSIS — Z0000 Encounter for general adult medical examination without abnormal findings: Secondary | ICD-10-CM

## 2016-09-03 MED ORDER — OMEPRAZOLE 40 MG PO CPDR: 40.0000 mg | DELAYED_RELEASE_CAPSULE | Freq: Every day | ORAL | 3 refills | Status: DC

## 2016-09-03 MED ORDER — FUROSEMIDE 20 MG PO TABS: 20.0000 mg | ORAL_TABLET | Freq: Every day | ORAL | 3 refills | Status: DC

## 2016-09-03 NOTE — Progress Notes (Signed)
   Subjective:    Patient ID: Crystal Little, female    DOB: 1941/04/11, 75 y.o.   MRN: [id number]  HPI 75 yr old female for a well exam. She feels fine and has no concerns.    Review of Systems  Constitutional: Negative.   HENT: Negative.   Eyes: Negative.   Respiratory: Negative.   Cardiovascular: Negative.   Gastrointestinal: Negative.   Genitourinary: Negative for decreased urine volume, difficulty urinating, dyspareunia, dysuria, enuresis, flank pain, frequency, hematuria, pelvic pain and urgency.  Musculoskeletal: Negative.   Skin: Negative.   Neurological: Negative.   Psychiatric/Behavioral: Negative.        Objective:   Physical Exam  Constitutional: She is oriented to person, place, and time. She appears well-developed and well-nourished. No distress.  HENT:  Head: Normocephalic and atraumatic.  Right Ear: External ear normal.  Left Ear: External ear normal.  Nose: Nose normal.  Mouth/Throat: Oropharynx is clear and moist. No oropharyngeal exudate.  Eyes: Conjunctivae and EOM are normal. Pupils are equal, round, and reactive to light. No scleral icterus.  Neck: Normal range of motion. Neck supple. No JVD present. No thyromegaly present.  Cardiovascular: Normal rate, regular rhythm, normal heart sounds and intact distal pulses.  Exam reveals no gallop and no friction rub.   No murmur heard. Pulmonary/Chest: Effort normal and breath sounds normal. No respiratory distress. She has no wheezes. She has no rales. She exhibits no tenderness.  Abdominal: Soft. Bowel sounds are normal. She exhibits no distension and no mass. There is no tenderness. There is no rebound and no guarding.  Musculoskeletal: Normal range of motion. She exhibits no edema or tenderness.  Lymphadenopathy:    She has no cervical adenopathy.  Neurological: She is alert and oriented to person, place, and time. She has normal reflexes. No cranial nerve deficit. She exhibits normal muscle tone.  Coordination normal.  Skin: Skin is warm and dry. No rash noted. No erythema.  Psychiatric: She has a normal mood and affect. Her behavior is normal. Judgment and thought content normal.          Assessment & Plan:  Well exam. We discussed diet and exercise.  Laurey Morale, MD

## 2016-09-03 NOTE — Progress Notes (Signed)
Pre visit review using our clinic review tool, if applicable. No additional management support is needed unless otherwise documented below in the visit note. 

## 2016-09-07 DIAGNOSIS — L821 Other seborrheic keratosis: Secondary | ICD-10-CM | POA: Diagnosis not present

## 2016-10-06 DIAGNOSIS — Z1231 Encounter for screening mammogram for malignant neoplasm of breast: Secondary | ICD-10-CM | POA: Diagnosis not present

## 2016-10-12 DIAGNOSIS — H40013 Open angle with borderline findings, low risk, bilateral: Secondary | ICD-10-CM | POA: Diagnosis not present

## 2016-10-26 ENCOUNTER — Other Ambulatory Visit: Payer: Self-pay | Admitting: Family Medicine

## 2016-11-09 ENCOUNTER — Telehealth: Payer: Self-pay | Admitting: Family Medicine

## 2016-11-09 MED ORDER — AZITHROMYCIN 250 MG PO TABS: ORAL_TABLET | ORAL | 0 refills | Status: DC

## 2016-11-09 NOTE — Telephone Encounter (Signed)
Call in a Randall. Add Delsym prn. Please tell her I was so sorry to hear about her husband.

## 2016-11-09 NOTE — Telephone Encounter (Signed)
Pt has cough, congestion and would like dr Sarajane Jews to call him something for her.  Pt states her husband passed away this past 01/26/2023, Elaina Pattee. Pt has so much to do she doesn't have time to come in. Hopes Dr Sarajane Jews will understand.  [facility] [address]

## 2016-11-09 NOTE — Telephone Encounter (Signed)
I sent script e[facility]scribe to [facility] and spoke with pt.  

## 2016-12-02 ENCOUNTER — Encounter: Payer: Self-pay | Admitting: Family Medicine

## 2016-12-02 ENCOUNTER — Ambulatory Visit (INDEPENDENT_AMBULATORY_CARE_PROVIDER_SITE_OTHER): Payer: Medicare Other | Admitting: Family Medicine

## 2016-12-02 VITALS — BP 134/80 | Ht 66.0 in | Wt 232.0 lb

## 2016-12-02 DIAGNOSIS — N390 Urinary tract infection, site not specified: Secondary | ICD-10-CM | POA: Diagnosis not present

## 2016-12-02 LAB — POC URINALSYSI DIPSTICK (AUTOMATED)
Bilirubin, UA: NEGATIVE
Blood, UA: NEGATIVE
GLUCOSE UA: NEGATIVE
Ketones, UA: NEGATIVE
Leukocytes, UA: NEGATIVE
NITRITE UA: NEGATIVE
Protein, UA: NEGATIVE
Spec Grav, UA: 1.015
UROBILINOGEN UA: 0.2
pH, UA: 6

## 2016-12-02 MED ORDER — CIPROFLOXACIN HCL 500 MG PO TABS: 500.0000 mg | ORAL_TABLET | Freq: Two times a day (BID) | ORAL | 0 refills | Status: DC

## 2016-12-02 NOTE — Progress Notes (Signed)
   Subjective:    Patient ID: Crystal Little, female    DOB: 28-Aug-1941, 76 y.o.   MRN: [id number]  HPI Here for 2 days of lower abdominal pressure, urgency to urinate and burning on urination. No back pain or fever. BMs are regular.    Review of Systems  Constitutional: Negative.   Respiratory: Negative.   Cardiovascular: Negative.   Gastrointestinal: Positive for abdominal pain. Negative for abdominal distention, anal bleeding, blood in stool, constipation, diarrhea, nausea, rectal pain and vomiting.  Genitourinary: Positive for dysuria, frequency and urgency. Negative for flank pain and hematuria.       Objective:   Physical Exam  Constitutional: She appears well-developed and well-nourished.  Cardiovascular: Normal rate, regular rhythm, normal heart sounds and intact distal pulses.   Pulmonary/Chest: Effort normal and breath sounds normal.  Abdominal: Soft. Bowel sounds are normal. She exhibits no distension and no mass. There is no rebound and no guarding.  Mildly tender above the pubis           Assessment & Plan:  Probable UTI. Treat with Cipro. Culture the sample.  Alysia Penna, MD

## 2016-12-02 NOTE — Patient Instructions (Signed)
**  Coming March 12th**  [facility] Fast [facility]!!!  Same Day Appointments for Acute Care: Sprains, Injuries, cuts, abrasions Colds, flu, sore throats, cough, upset stomachs Fever, ear pain Sinus and eye infections Animal/insect bites  3 Easy Ways to Schedule: Walk-In Scheduling Call in scheduling Mychart Sign-up: [email or web address]         

## 2016-12-02 NOTE — Progress Notes (Signed)
Pre visit review using our clinic review tool, if applicable. No additional management support is needed unless otherwise documented below in the visit note. 

## 2016-12-04 LAB — URINE CULTURE

## 2016-12-21 DIAGNOSIS — H40013 Open angle with borderline findings, low risk, bilateral: Secondary | ICD-10-CM | POA: Diagnosis not present

## 2017-05-17 ENCOUNTER — Ambulatory Visit (INDEPENDENT_AMBULATORY_CARE_PROVIDER_SITE_OTHER): Payer: Medicare Other | Admitting: Family Medicine

## 2017-05-17 ENCOUNTER — Encounter: Payer: Self-pay | Admitting: Family Medicine

## 2017-05-17 VITALS — BP 124/74 | HR 69 | Temp 98.5°F | Ht 66.0 in | Wt 223.0 lb

## 2017-05-17 DIAGNOSIS — M5431 Sciatica, right side: Secondary | ICD-10-CM

## 2017-05-17 MED ORDER — METHYLPREDNISOLONE 4 MG PO TBPK: ORAL_TABLET | ORAL | 0 refills | Status: DC

## 2017-05-17 MED ORDER — CYCLOBENZAPRINE HCL 10 MG PO TABS: 10.0000 mg | ORAL_TABLET | Freq: Three times a day (TID) | ORAL | 2 refills | Status: DC | PRN

## 2017-05-17 NOTE — Patient Instructions (Signed)
WE NOW OFFER   [facility] FAST TRACK!!!  SAME DAY Appointments for ACUTE CARE  Such as: Sprains, Injuries, cuts, abrasions, rashes, muscle pain, joint pain, back pain Colds, flu, sore throats, headache, allergies, cough, fever  Ear pain, sinus and eye infections Abdominal pain, nausea, vomiting, diarrhea, upset stomach Animal/insect bites  3 Easy Ways to Schedule: Walk-In Scheduling Call in scheduling Mychart Sign-up: [email or web address]         

## 2017-05-17 NOTE — Progress Notes (Signed)
   Subjective:    Patient ID: Crystal Little, female    DOB: [birth year], 76 y.o.   MRN: [id number]  HPI Here for 2 weeks of sharp pains in the right buttock that shoot down the right leg. No recent trauma or falls. No numbness or weakness in the legs. Using Bio Freeze and heat and Aleve with partial results.    Review of Systems  Constitutional: Negative.   Respiratory: Negative.   Cardiovascular: Negative.   Gastrointestinal: Negative.   Genitourinary: Negative.   Musculoskeletal: Positive for back pain.       Objective:   Physical Exam  Constitutional: She is oriented to person, place, and time. She appears well-developed and well-nourished.  Walks with a bit of pain  Cardiovascular: Normal rate, regular rhythm, normal heart sounds and intact distal pulses.   Pulmonary/Chest: Effort normal and breath sounds normal. No respiratory distress. She has no wheezes. She has no rales.  Musculoskeletal:  Tender in the right lower back and over the right sciatic notch. Full ROM, negative SLR.   Neurological: She is alert and oriented to person, place, and time.          Assessment & Plan:  Low back pain with sciatica. Use heat. Try a Medrol dose pack and Flexeril. Recheck prn.  Alysia Penna, MD

## 2017-06-07 ENCOUNTER — Ambulatory Visit (INDEPENDENT_AMBULATORY_CARE_PROVIDER_SITE_OTHER): Payer: Medicare Other | Admitting: Family Medicine

## 2017-06-07 ENCOUNTER — Encounter: Payer: Self-pay | Admitting: Family Medicine

## 2017-06-07 VITALS — BP 128/76 | HR 70 | Temp 98.1°F | Ht 66.0 in | Wt 224.0 lb

## 2017-06-07 DIAGNOSIS — M5441 Lumbago with sciatica, right side: Secondary | ICD-10-CM | POA: Diagnosis not present

## 2017-06-07 MED ORDER — DIAZEPAM 5 MG PO TABS: 5.0000 mg | ORAL_TABLET | Freq: Two times a day (BID) | ORAL | 0 refills | Status: DC | PRN

## 2017-06-07 NOTE — Progress Notes (Signed)
   Subjective:    Patient ID: Crystal Little, female    DOB: 04-23-1941, 76 y.o.   MRN: [id number]  HPI Here to follow up on right sciatica pain which started about 6 weeks ago. She has tried Flexeril and a Medrol dose pack but these did not help at all. Still no numbness or weakness.    Review of Systems  Constitutional: Negative.   Respiratory: Negative.   Cardiovascular: Negative.   Musculoskeletal: Positive for back pain.       Objective:   Physical Exam  Constitutional: She is oriented to person, place, and time. She appears well-developed and well-nourished.  Cardiovascular: Normal rate, regular rhythm, normal heart sounds and intact distal pulses.   Pulmonary/Chest: Effort normal and breath sounds normal.  Neurological: She is alert and oriented to person, place, and time.          Assessment & Plan:  Sciatica. We will set up a lumbar spine MRI soon to assess further.  Alysia Penna, MD

## 2017-06-07 NOTE — Patient Instructions (Signed)
WE NOW OFFER   [facility] FAST TR[facility]!!!  SAME DAY Appointments for ACUTE CARE  Such as: Sprains, Injuries, cuts, abrasions, rashes, muscle pain, joint pain, back pain Colds, flu, sore throats, headache, allergies, cough, fever  Ear pain, sinus and eye infections Abdominal pain, nausea, vomiting, diarrhea, upset stomach Animal/insect bites  3 Easy Ways to Schedule: Walk-In Scheduling Call in scheduling Mychart Sign-up: [email or web address]         

## 2017-06-10 ENCOUNTER — Telehealth: Payer: Self-pay

## 2017-06-10 NOTE — Telephone Encounter (Signed)
Patient called and states that she is supposed to be called for an MRI. She has not heard from anyone. I provided her with the following information:  Sent to work que for scheduling at  [facility] Address: [address], [place], [place] [address] Phone: [phone] Their office will contact pt to schedule directly

## 2017-06-17 ENCOUNTER — Other Ambulatory Visit: Payer: Medicare Other

## 2017-06-22 ENCOUNTER — Ambulatory Visit
Admission: RE | Admit: 2017-06-22 | Discharge: 2017-06-22 | Disposition: A | Discharge status: Home / Self Care | Payer: Medicare Other | Source: Ambulatory Visit | Attending: Family Medicine | Admitting: Family Medicine

## 2017-06-22 DIAGNOSIS — M5441 Lumbago with sciatica, right side: Secondary | ICD-10-CM

## 2017-06-22 DIAGNOSIS — M48061 Spinal stenosis, lumbar region without neurogenic claudication: Secondary | ICD-10-CM | POA: Diagnosis not present

## 2017-06-23 ENCOUNTER — Encounter: Payer: Self-pay | Admitting: Family Medicine

## 2017-06-24 ENCOUNTER — Telehealth: Payer: Self-pay | Admitting: Family Medicine

## 2017-06-24 NOTE — Telephone Encounter (Signed)
Pt viewed her mri on mychart and would like sylvia to call her back she has questions

## 2017-06-27 NOTE — Telephone Encounter (Signed)
I spoke with pt and went over results from MRI.

## 2017-06-28 DIAGNOSIS — H04123 Dry eye syndrome of bilateral lacrimal glands: Secondary | ICD-10-CM | POA: Diagnosis not present

## 2017-06-28 DIAGNOSIS — H40013 Open angle with borderline findings, low risk, bilateral: Secondary | ICD-10-CM | POA: Diagnosis not present

## 2017-06-28 DIAGNOSIS — H43813 Vitreous degeneration, bilateral: Secondary | ICD-10-CM | POA: Diagnosis not present

## 2017-06-28 DIAGNOSIS — Z961 Presence of intraocular lens: Secondary | ICD-10-CM | POA: Diagnosis not present

## 2017-06-29 ENCOUNTER — Telehealth: Payer: Self-pay | Admitting: Family Medicine

## 2017-06-29 DIAGNOSIS — M5431 Sciatica, right side: Secondary | ICD-10-CM

## 2017-06-29 NOTE — Telephone Encounter (Signed)
I left a voice message for pt with below information.  

## 2017-06-29 NOTE — Telephone Encounter (Signed)
I did the referral for PT

## 2017-06-29 NOTE — Telephone Encounter (Signed)
Pt is needing a order for PT for her hip from the results of the MRI on last Wednesday.

## 2017-07-04 ENCOUNTER — Encounter: Payer: Self-pay | Admitting: Physical Therapy

## 2017-07-04 ENCOUNTER — Ambulatory Visit: Payer: Medicare Other | Attending: Family Medicine | Admitting: Physical Therapy

## 2017-07-04 DIAGNOSIS — M25551 Pain in right hip: Secondary | ICD-10-CM | POA: Diagnosis not present

## 2017-07-04 DIAGNOSIS — G8929 Other chronic pain: Secondary | ICD-10-CM | POA: Insufficient documentation

## 2017-07-04 DIAGNOSIS — M5441 Lumbago with sciatica, right side: Secondary | ICD-10-CM | POA: Diagnosis not present

## 2017-07-04 DIAGNOSIS — M62830 Muscle spasm of back: Secondary | ICD-10-CM | POA: Insufficient documentation

## 2017-07-04 DIAGNOSIS — M6281 Muscle weakness (generalized): Secondary | ICD-10-CM | POA: Diagnosis not present

## 2017-07-04 NOTE — Therapy (Signed)
[facility] [address] [place], [place], [address] Phone: [phone]   Fax:  [phone]  Physical Therapy Evaluation  Patient Details  Name: Crystal Little MRN: [id number] Date of Birth: [birth year] Referring Provider: Alysia Penna, MD   Encounter Date: 07/04/2017      PT End of Session - 07/04/17 1558    Visit Number 1   Number of Visits 13   Date for PT Re-Evaluation 08/15/17   Authorization Type [employer]    Authorization Time Period 07/04/17 to 08/15/17   Authorization - Visit Number 1   Authorization - Number of Visits 10   PT Start Time 1194   PT Stop Time 1740   PT Time Calculation (min) 44 min   Activity Tolerance Patient tolerated treatment well;No increased pain   Behavior During Therapy WFL for tasks assessed/performed      Past Medical History:  Diagnosis Date  . Diverticulosis   . Frequent UTI    sees Dr. Rosana Hoes  . GERD (gastroesophageal reflux disease)   . Gynecological examination    sees Dr. Sander Radon  . IBS (irritable bowel syndrome)   . Leg edema   . Osteoarthritis   . Status post dilation of esophageal narrowing   . Varicose veins of legs     Past Surgical History:  Procedure Laterality Date  . COLONOSCOPY  04-26-11   per Dr. Sharlett Iles, diverticulosis only, no repeats needed  . endovascular repair of bilateral leg varicosities     per Dr. Jones Skene   . ESOPHAGOGASTRODUODENOSCOPY  01-19-10   per Dr. Sharlett Iles, with dilatation   . ESOPHAGOGASTRODUODENOSCOPY (EGD) WITH ESOPHAGEAL DILATION    . right knee arthroscopy  12/17/05 and 01-06-12   per Dr. Percell Miller  . TONSILLECTOMY      There were no vitals filed for this visit.       Subjective Assessment - 07/04/17 1450    Subjective Pt reports having pain across her Rt hip down the leg that started in July. She does not recall any injury or specific onset. She reports that her pain had been getting better until last week,  when the pain seemed to return. She went to the Chiropractor which she does not feel helped much. Otherwise she is having to manage her pain with icy hot.    Limitations Sitting   How long can you sit comfortably? unable to sit comfortably for any amount of time   Currently in Pain? No/denies   Pain Location Hip   Pain Orientation Right;Posterior   Pain Descriptors / Indicators Aching   Pain Type Chronic pain   Pain Radiating Towards down the back of the Rt leg to the top of the foot    Pain Onset More than a month ago   Pain Frequency Constant   Aggravating Factors  rolling over in the bed, sitting for any amount of time   Pain Relieving Factors standing up decreases pain    Effect of Pain on Daily Activities limited sitting tolerance             [facility] PT Assessment - 07/05/17 0001      Assessment   Medical Diagnosis Low back pain with Rt sided sciatica    Referring Provider Alysia Penna, MD    Onset Date/Surgical Date --  July 2018   Next MD Visit None for this    Prior Therapy none      Balance Screen   Has the patient fallen  in the past 6 months No   Has the patient had a decrease in activity level because of a fear of falling?  No   Is the patient reluctant to leave their home because of a fear of falling?  No     Prior Function   Level of Independence Independent     Cognition   Overall Cognitive Status Within Functional Limits for tasks assessed     Observation/Other Assessments   Observations sitting slouched to the Rt, foward head and rounded shoulders    Focus on Therapeutic Outcomes (FOTO)  40% limited      Sensation   Light Touch Appears Intact   Additional Comments Pt denies any numbness/tingling      AROM   Lumbar Flexion RFIS x10 reps, symptoms decreased at shin    Lumbar Extension REIS x15 reps, symptoms to mid shin     Strength   Right Hip Flexion 5/5   Right Hip Extension 4+/5   Right Hip ABduction 4/5  (+) pain down the leg   Left Hip  Flexion 5/5   Left Hip Extension 4+/5   Left Hip ABduction 5/5   Right Knee Flexion 4/5   Right Knee Extension 5/5   Left Knee Flexion 4/5   Left Knee Extension 5/5   Right Ankle Dorsiflexion 5/5   Left Ankle Dorsiflexion 5/5     Flexibility   Soft Tissue Assessment /Muscle Length yes   ITB (+) pain on the Rt    Piriformis 50% limited on the Rt      Palpation   Palpation comment tenderness with palpation along the Rt piriformis, Rt ITB, glute med region      Special Tests    Special Tests Lumbar   Lumbar Tests Slump Test;FABER test     FABER test   findings Negative     Slump test   Findings Positive   Side Right     Transfers   Five time sit to stand comments  12.2 sec, UE on thighs and slight weight shift Lt             Objective measurements completed on examination: See above findings.          [facility] PT Treatment/Exercise - 07/05/17 0001      Self-Care   Self-Care Posture   Posture use of lumbar roll while seated to improve posture awareness      Exercises   Exercises Lumbar     Lumbar Exercises: Stretches   Piriformis Stretch 2 reps;30 seconds   Piriformis Stretch Limitations seated x1 rep, supine x2 reps RLE only      Lumbar Exercises: Seated   Other Seated Lumbar Exercises long sitting with BUE support, hip abduction x5 reps  HEP demo                 PT Education - 07/04/17 1556    Education provided Yes   Education Details eval findings/POC; benefits of addressing flexibility and strength limitations; importance of addressing posture awareness; implemented and reviewed HEP    Person(s) Educated Patient   Methods Explanation;Verbal cues;Tactile cues;Demonstration;Handout   Comprehension Verbalized understanding;Returned demonstration          PT Short Term Goals - 07/04/17 1611      PT SHORT TERM GOAL #1   Title Pt will demo consistency and independence with her HEP to decrease pain.    Time 3   Period Weeks    Status New  Target Date 07/25/17     PT SHORT TERM GOAL #2   Title Pt will demo proper use and set up of lumbar roll to improve her sitting posture and decrease symptoms throughout the day.    Time 3   Period Weeks   Status New           PT Long Term Goals - 07/04/17 1636      PT LONG TERM GOAL #1   Title Pt will demo improved BLE strength to 5/5 MMT which will improve her efficiency and safety with activity.    Time 6   Period Weeks   Status New   Target Date 08/15/17     PT LONG TERM GOAL #2   Title Pt will report atleast 50% improvement in her symptoms from the start of therapy, to improve her ability to complete daily activity with less difficulty.    Time 6   Period Weeks   Status New     PT LONG TERM GOAL #3   Title Pt will report she is able to sit for atleast 30 min without increase in her symptoms, to allow for her to drive around town and complete errands.    Time 6   Period Weeks   Status New                Plan - 07/04/17 1559    Clinical Impression Statement Pt is a 76 y.o F referred to OPPT with complaints of symptoms running from her Rt posterior hip down the lateral thigh to the mid shin. Pt's symptoms began suddenly and have fluctuated since the start several months ago. She presents with overall good strength of the LEs, with primary weakness in the Rt hip abductors and the extensors. She also demonstrates limitations in hip flexibility and poor posture awareness evident by her seated posture throughout the evaluation. She currently has the most difficulty with sitting for any amount of time, so therapist educated her on the benefits of a lumbar roll for additional back support. Therapist also implemented and reviewed the pt's HEP for full understanding. Pt would benefit from skilled PT to address her limitations in strength, ROM and mobility, to improve her activity and quality of life.    History and Personal Factors relevant to plan of care:  insidious onset of symptoms, fluctuating presentation since its onset    Clinical Presentation Unstable   Clinical Presentation due to: symptoms improved and worsened over time without pt explanation as to why   Clinical Decision Making Low   Rehab Potential Good   PT Frequency 2x / week   PT Duration 6 weeks   PT Treatment/Interventions ADLs/Self Care Home Management;Electrical Stimulation;Cryotherapy;Moist Heat;Traction;Balance training;Therapeutic exercise;Therapeutic activities;Functional mobility training;Stair training;Neuromuscular re-education;Patient/family education;Passive range of motion;Manual techniques;Dry needling   PT Next Visit Plan focus on promoting proximal hip strength, hip flexibility and use of manual techniques as needed to address pain and muscle spasm   PT Home Exercise Plan lumbar roll, long sitting hip abduction 2x10, supine piriformis stretch x3 reps, seated rows with green TB   Consulted and Agree with Plan of Care Patient      Patient will benefit from skilled therapeutic intervention in order to improve the following deficits and impairments:  Decreased activity tolerance, Decreased balance, Decreased range of motion, Decreased strength, Hypomobility, Decreased mobility, Improper body mechanics, Pain, Postural dysfunction, Increased muscle spasms, Impaired flexibility  Visit Diagnosis: Chronic low back pain with right-sided sciatica, unspecified back pain laterality  Pain in right hip  Muscle weakness (generalized)  Muscle spasm of back      G-Codes - 07/14/17 1607    Functional Assessment Tool Used (Outpatient Only) FOTO   Functional Limitation Changing and maintaining body position   Changing and Maintaining Body Position Current Status (E8315) At least 40 percent but less than 60 percent impaired, limited or restricted   Changing and Maintaining Body Position Goal Status (V7616) At least 20 percent but less than 40 percent impaired, limited or  restricted       Problem List Patient Active Problem List   Diagnosis Date Noted  . Sciatica, right side 05/17/2017  . Special screening for malignant neoplasms, colon 04/26/2011  . Diverticulosis of colon (without mention of hemorrhage) 04/26/2011  . ABDOMINAL PAIN, GENERALIZED 06/01/2010  . Dysphagia, pharyngoesophageal phase 01/08/2010  . METATARSALGIA 12/24/2008  . PLANTAR FASCIITIS, LEFT 12/24/2008  . ACUTE CYSTITIS 12/25/2007  . OSTEOARTHRITIS 05/09/2007  . DEPENDENT EDEMA 05/09/2007  . CHICKENPOX, HX OF 05/09/2007  . GERD 05/02/2007    8:09 AM,07/05/17 Elly Modena PT, DPT [facility] [address] [place], [place], [address] Phone: [phone]   Fax:  [phone]  Name: MCKENLEIGH TARLTON MRN: [id number] Date of Birth: [birth year]

## 2017-07-06 ENCOUNTER — Ambulatory Visit: Payer: Medicare Other | Admitting: Physical Therapy

## 2017-07-06 DIAGNOSIS — M25551 Pain in right hip: Secondary | ICD-10-CM

## 2017-07-06 DIAGNOSIS — G8929 Other chronic pain: Secondary | ICD-10-CM | POA: Diagnosis not present

## 2017-07-06 DIAGNOSIS — M6281 Muscle weakness (generalized): Secondary | ICD-10-CM | POA: Diagnosis not present

## 2017-07-06 DIAGNOSIS — M5441 Lumbago with sciatica, right side: Secondary | ICD-10-CM | POA: Diagnosis not present

## 2017-07-06 DIAGNOSIS — M62830 Muscle spasm of back: Secondary | ICD-10-CM | POA: Diagnosis not present

## 2017-07-06 NOTE — Therapy (Signed)
[facility] [address] [place], [place], [address] Phone: [phone]   Fax:  [phone]  Physical Therapy Treatment  Patient Details  Name: Crystal Little MRN: [id number] Date of Birth: 1940/12/15 Referring Provider: Alysia Penna, MD   Encounter Date: 07/06/2017      PT End of Session - 07/06/17 1553    Visit Number 2   Number of Visits 13   Date for PT Re-Evaluation 08/15/17   Authorization Type [employer] Medicare    Authorization Time Period 07/04/17 to 08/15/17   Authorization - Visit Number 2   Authorization - Number of Visits 10   PT Start Time 3818   PT Stop Time 1612   PT Time Calculation (min) 42 min   Activity Tolerance Patient tolerated treatment well;No increased pain   Behavior During Therapy WFL for tasks assessed/performed      Past Medical History:  Diagnosis Date  . Diverticulosis   . Frequent UTI    sees Dr. Rosana Hoes  . GERD (gastroesophageal reflux disease)   . Gynecological examination    sees Dr. Sander Radon  . IBS (irritable bowel syndrome)   . Leg edema   . Osteoarthritis   . Status post dilation of esophageal narrowing   . Varicose veins of legs     Past Surgical History:  Procedure Laterality Date  . COLONOSCOPY  04-26-11   per Dr. Sharlett Iles, diverticulosis only, no repeats needed  . endovascular repair of bilateral leg varicosities     per Dr. Jones Skene   . ESOPHAGOGASTRODUODENOSCOPY  01-19-10   per Dr. Sharlett Iles, with dilatation   . ESOPHAGOGASTRODUODENOSCOPY (EGD) WITH ESOPHAGEAL DILATION    . right knee arthroscopy  12/17/05 and 01-06-12   per Dr. Percell Miller  . TONSILLECTOMY      There were no vitals filed for this visit.      Subjective Assessment - 07/06/17 1532    Subjective Pt reports that things are going well. She sat on the bleachers yesterday and her Rt leg started bothering her by the end of the swimming practice. She has been completing her HEP.    Limitations  Sitting   How long can you sit comfortably? unable to sit comfortably for any amount of time   Currently in Pain? No/denies   Pain Onset More than a month ago                         [facility] Adult PT Treatment/Exercise - 07/06/17 0001      Lumbar Exercises: Supine   Ab Set 10 reps;3 seconds   Clam 15 reps   Clam Limitations green TB, single leg    Bridge 10 reps   Bridge Limitations green TB around knees    Other Supine Lumbar Exercises ab set with ball adductor squeeze in hooklying 10x3 sec      Lumbar Exercises: Sidelying   Hip Abduction 10 reps   Hip Abduction Limitations abd/ext     Manual Therapy   Manual Therapy Soft tissue mobilization;Passive ROM   Soft tissue mobilization STM Rt gluteals, Vastus lateralis/ITB    Passive ROM Rt piriformis stretch, Rt glute max stretch 5x15 sec each                 PT Education - 07/06/17 1549    Education provided Yes   Education Details technique with therex; updated and adjustments to HEP   Person(s) Educated Patient   Methods Handout;Tactile  cues;Verbal cues;Explanation   Comprehension Verbalized understanding;Returned demonstration          PT Short Term Goals - 07/04/17 1611      PT SHORT TERM GOAL #1   Title Pt will demo consistency and independence with her HEP to decrease pain.    Time 3   Period Weeks   Status New   Target Date 07/25/17     PT SHORT TERM GOAL #2   Title Pt will demo proper use and set up of lumbar roll to improve her sitting posture and decrease symptoms throughout the day.    Time 3   Period Weeks   Status New           PT Long Term Goals - 07/04/17 1636      PT LONG TERM GOAL #1   Title Pt will demo improved BLE strength to 5/5 MMT which will improve her efficiency and safety with activity.    Time 6   Period Weeks   Status New   Target Date 08/15/17     PT LONG TERM GOAL #2   Title Pt will report atleast 50% improvement in her symptoms from the start of  therapy, to improve her ability to complete daily activity with less difficulty.    Time 6   Period Weeks   Status New     PT LONG TERM GOAL #3   Title Pt will report she is able to sit for atleast 30 min without increase in her symptoms, to allow for her to drive around town and complete errands.    Time 6   Period Weeks   Status New               Plan - 07/06/17 1628    Clinical Impression Statement Pt arrived without minor improvements since her initial evaluation, reporting no symptoms currently. She has been utilizing the towel for lumbar support with positive results and has been completing her HEP with increasing ease. Focused today on therex to improve deep abdominal strength/endurance in addition to proximal hip strength. Pt completed all exercises without report of symptom exacerbation. Made some adjustments to her HEP to reflect today's activities and she verbalized/demonstrated good understanding at this time.   Rehab Potential Good   PT Frequency 2x / week   PT Duration 6 weeks   PT Treatment/Interventions ADLs/Self Care Home Management;Electrical Stimulation;Cryotherapy;Moist Heat;Traction;Balance training;Therapeutic exercise;Therapeutic activities;Functional mobility training;Stair training;Neuromuscular re-education;Patient/family education;Passive range of motion;Manual techniques;Dry needling   PT Next Visit Plan focus on promoting proximal hip strength, hip flexibility and use of manual techniques as needed to address pain and muscle spasm   PT Home Exercise Plan lumbar roll, long side hip abduction x10, supine piriformis stretch x3 reps, seated rows with green TB, supine abdominal bracing    Consulted and Agree with Plan of Care Patient      Patient will benefit from skilled therapeutic intervention in order to improve the following deficits and impairments:  Decreased activity tolerance, Decreased balance, Decreased range of motion, Decreased strength,  Hypomobility, Decreased mobility, Improper body mechanics, Pain, Postural dysfunction, Increased muscle spasms, Impaired flexibility  Visit Diagnosis: Chronic low back pain with right-sided sciatica, unspecified back pain laterality  Pain in right hip  Muscle weakness (generalized)  Muscle spasm of back     Problem List Patient Active Problem List   Diagnosis Date Noted  . Sciatica, right side 05/17/2017  . Special screening for malignant neoplasms, colon 04/26/2011  . Diverticulosis of colon (  without mention of hemorrhage) 04/26/2011  . ABDOMINAL PAIN, GENERALIZED 06/01/2010  . Dysphagia, pharyngoesophageal phase 01/08/2010  . METATARSALGIA 12/24/2008  . PLANTAR FASCIITIS, LEFT 12/24/2008  . ACUTE CYSTITIS 12/25/2007  . OSTEOARTHRITIS 05/09/2007  . DEPENDENT EDEMA 05/09/2007  . CHICKENPOX, HX OF 05/09/2007  . GERD 05/02/2007    4:32 PM,07/06/17 Elly Modena PT, DPT [place] [address] [place], [place], [address] Phone: [phone]   Fax:  [phone]  Name: Crystal Little MRN: [id number] Date of Birth: 09-10-1941

## 2017-07-11 ENCOUNTER — Ambulatory Visit: Payer: Medicare Other | Admitting: Physical Therapy

## 2017-07-11 DIAGNOSIS — M25551 Pain in right hip: Secondary | ICD-10-CM

## 2017-07-11 DIAGNOSIS — M5441 Lumbago with sciatica, right side: Secondary | ICD-10-CM | POA: Diagnosis not present

## 2017-07-11 DIAGNOSIS — M6281 Muscle weakness (generalized): Secondary | ICD-10-CM | POA: Diagnosis not present

## 2017-07-11 DIAGNOSIS — M62830 Muscle spasm of back: Secondary | ICD-10-CM

## 2017-07-11 DIAGNOSIS — G8929 Other chronic pain: Secondary | ICD-10-CM | POA: Diagnosis not present

## 2017-07-11 NOTE — Patient Instructions (Signed)
  2x10 reps, keep back flat against floor and avoid letting your hip roll

## 2017-07-11 NOTE — Therapy (Signed)
[facility] [address] [place], [place], [address] Phone: [phone]   Fax:  [phone]  Physical Therapy Treatment  Patient Details  Name: Crystal Little MRN: [id number] Date of Birth: 1940-10-17 Referring Provider: Alysia Penna, MD   Encounter Date: 07/11/2017      PT End of Session - 07/11/17 1451    Visit Number 3   Number of Visits 13   Date for PT Re-Evaluation 08/15/17   Authorization Type [employer] Medicare    Authorization Time Period 07/04/17 to 08/15/17   Authorization - Visit Number 3   Authorization - Number of Visits 10   PT Start Time 9030   PT Stop Time 0923   PT Time Calculation (min) 42 min   Activity Tolerance Patient tolerated treatment well;No increased pain   Behavior During Therapy WFL for tasks assessed/performed      Past Medical History:  Diagnosis Date  . Diverticulosis   . Frequent UTI    sees Dr. Rosana Hoes  . GERD (gastroesophageal reflux disease)   . Gynecological examination    sees Dr. Sander Radon  . IBS (irritable bowel syndrome)   . Leg edema   . Osteoarthritis   . Status post dilation of esophageal narrowing   . Varicose veins of legs     Past Surgical History:  Procedure Laterality Date  . COLONOSCOPY  04-26-11   per Dr. Sharlett Iles, diverticulosis only, no repeats needed  . endovascular repair of bilateral leg varicosities     per Dr. Jones Skene   . ESOPHAGOGASTRODUODENOSCOPY  01-19-10   per Dr. Sharlett Iles, with dilatation   . ESOPHAGOGASTRODUODENOSCOPY (EGD) WITH ESOPHAGEAL DILATION    . right knee arthroscopy  12/17/05 and 01-06-12   per Dr. Percell Miller  . TONSILLECTOMY      There were no vitals filed for this visit.      Subjective Assessment - 07/11/17 1449    Subjective Pt reports that she thinks things are improving. She continues to work on her exercises at home.    Limitations Sitting   How long can you sit comfortably? unable to sit comfortably for any amount  of time   Currently in Pain? No/denies   Pain Onset More than a month ago                         [facility] Adult PT Treatment/Exercise - 07/11/17 0001      Exercises   Exercises Other Exercises   Other Exercises  hip adductor stretch 2x30 sec each, standing      Lumbar Exercises: Standing   Other Standing Lumbar Exercises straight leg hip ext 2x10 reps each LE, addition of red TB during 2nd set      Lumbar Exercises: Seated   Sit to Stand 10 reps   Sit to Stand Limitations x1 set on solid surface, 2nd set on foam pad      Lumbar Exercises: Supine   Clam 10 reps   Clam Limitations green TB, single leg with abdominal bracing, x2 sets    Bridge 15 reps   Bridge Limitations x2 sets    [employer] Oblique Isometric 10 reps;3 seconds   Large Ball Oblique Isometric Limitations red physioball    Other Supine Lumbar Exercises ab set with ball adductor squeeze in hooklying 15x3 sec                 PT Education - 07/11/17 1503    Education  provided Yes   Education Details technique with therex; addition to HEP; discussed typical process and expectations for goals met to determine discharge in the future    Person(s) Educated Patient   Methods Explanation;Verbal cues;Tactile cues;Handout   Comprehension Returned demonstration;Verbalized understanding          PT Short Term Goals - 07/11/17 1534      PT SHORT TERM GOAL #1   Title Pt will demo consistency and independence with her HEP to decrease pain.    Time 3   Period Weeks   Status On-going     PT SHORT TERM GOAL #2   Title Pt will demo proper use and set up of lumbar roll to improve her sitting posture and decrease symptoms throughout the day.    Baseline reports using this in the car with much improvement    Time 3   Period Weeks   Status Achieved           PT Long Term Goals - 07/11/17 1534      PT LONG TERM GOAL #1   Title Pt will demo improved BLE strength to 5/5 MMT which will improve  her efficiency and safety with activity.    Time 6   Period Weeks   Status On-going     PT LONG TERM GOAL #2   Title Pt will report atleast 50% improvement in her symptoms from the start of therapy, to improve her ability to complete daily activity with less difficulty.    Time 6   Period Weeks   Status On-going     PT LONG TERM GOAL #3   Title Pt will report she is able to sit for atleast 30 min without increase in her symptoms, to allow for her to drive around town and complete errands.    Time 6   Period Weeks   Status On-going               Plan - 07/11/17 1533    Clinical Impression Statement Pt is making progress towards goals, reporting an improvement in her symptoms since starting therapy a couple of weeks ago. She arrived today without report of pain or discomfort and was able to complete all of today's exercises without significant difficulty. Therapist progressed several of her hip strengthening exercises and incorporated more deep abdominal activation to allow for better endurance and strength during daily activity. Ended session with updates to her HEP, and pt verbalized understanding.    Rehab Potential Good   PT Frequency 2x / week   PT Duration 6 weeks   PT Treatment/Interventions ADLs/Self Care Home Management;Electrical Stimulation;Cryotherapy;Moist Heat;Traction;Balance training;Therapeutic exercise;Therapeutic activities;Functional mobility training;Stair training;Neuromuscular re-education;Patient/family education;Passive range of motion;Manual techniques;Dry needling   PT Next Visit Plan focus on promoting proximal hip strength, hip flexibility and use of manual techniques as needed to address pain and muscle spasm   PT Home Exercise Plan lumbar roll, long side hip abduction x10, supine piriformis stretch x3 reps, seated rows with green TB, supine abdominal bracing    Consulted and Agree with Plan of Care Patient      Patient will benefit from skilled  therapeutic intervention in order to improve the following deficits and impairments:  Decreased activity tolerance, Decreased balance, Decreased range of motion, Decreased strength, Hypomobility, Decreased mobility, Improper body mechanics, Pain, Postural dysfunction, Increased muscle spasms, Impaired flexibility  Visit Diagnosis: Chronic low back pain with right-sided sciatica, unspecified back pain laterality  Pain in right hip  Muscle weakness (generalized)  Muscle spasm of back     Problem List Patient Active Problem List   Diagnosis Date Noted  . Sciatica, right side 05/17/2017  . Special screening for malignant neoplasms, colon 04/26/2011  . Diverticulosis of colon (without mention of hemorrhage) 04/26/2011  . ABDOMINAL PAIN, GENERALIZED 06/01/2010  . Dysphagia, pharyngoesophageal phase 01/08/2010  . METATARSALGIA 12/24/2008  . PLANTAR FASCIITIS, LEFT 12/24/2008  . ACUTE CYSTITIS 12/25/2007  . OSTEOARTHRITIS 05/09/2007  . DEPENDENT EDEMA 05/09/2007  . CHICKENPOX, HX OF 05/09/2007  . GERD 05/02/2007    3:42 PM,07/11/17 Elly Modena PT, DPT [place] [facility] [address] [place], [place], [address] Phone: [phone]   Fax:  [phone]  Name: Crystal Little MRN: [id number] Date of Birth: 12-17-1940

## 2017-07-13 ENCOUNTER — Encounter: Payer: Medicare Other | Admitting: Physical Therapy

## 2017-07-18 ENCOUNTER — Ambulatory Visit: Payer: Medicare Other

## 2017-07-18 DIAGNOSIS — M62830 Muscle spasm of back: Secondary | ICD-10-CM | POA: Diagnosis not present

## 2017-07-18 DIAGNOSIS — M6281 Muscle weakness (generalized): Secondary | ICD-10-CM | POA: Diagnosis not present

## 2017-07-18 DIAGNOSIS — M5441 Lumbago with sciatica, right side: Principal | ICD-10-CM

## 2017-07-18 DIAGNOSIS — M25551 Pain in right hip: Secondary | ICD-10-CM

## 2017-07-18 DIAGNOSIS — G8929 Other chronic pain: Secondary | ICD-10-CM

## 2017-07-18 NOTE — Therapy (Signed)
[facility] [address], [place], [place], [address] Phone: [phone]   Fax:  [phone]  Physical Therapy Treatment  Patient Details  Name: Crystal Little MRN: [id number] Date of Birth: [birth year] Referring Provider: Alysia Penna, MD   Encounter Date: 07/18/2017      PT End of Session - 07/18/17 1529    Visit Number 4   Number of Visits 10   Date for PT Re-Evaluation 08/15/17   Authorization Type [employer] Medicare    Authorization - Visit Number 4   Authorization - Number of Visits 10   PT Start Time 2683  late and coughing/fatigue   PT Stop Time 1525   PT Time Calculation (min) 36 min   Activity Tolerance Patient tolerated treatment well   Behavior During Therapy [facility] for tasks assessed/performed      Past Medical History:  Diagnosis Date  . Diverticulosis   . Frequent UTI    sees Dr. Rosana Hoes  . GERD (gastroesophageal reflux disease)   . Gynecological examination    sees Dr. Sander Radon  . IBS (irritable bowel syndrome)   . Leg edema   . Osteoarthritis   . Status post dilation of esophageal narrowing   . Varicose veins of legs     Past Surgical History:  Procedure Laterality Date  . COLONOSCOPY  04-26-11   per Dr. Sharlett Iles, diverticulosis only, no repeats needed  . endovascular repair of bilateral leg varicosities     per Dr. Jones Skene   . ESOPHAGOGASTRODUODENOSCOPY  01-19-10   per Dr. Sharlett Iles, with dilatation   . ESOPHAGOGASTRODUODENOSCOPY (EGD) WITH ESOPHAGEAL DILATION    . right knee arthroscopy  12/17/05 and 01-06-12   per Dr. Percell Miller  . TONSILLECTOMY      There were no vitals filed for this visit.      Subjective Assessment - 07/18/17 1452    Subjective I'm doing my exercises.  I am feeling better.  Pt reports 75% overall improvement since the start of care.     Currently in Pain? No/denies                         [facility] Adult PT Treatment/Exercise - 07/18/17 0001       Exercises   Other Exercises  hip adductor stretch 2x30 sec each, standing      Lumbar Exercises: Stretches   Active Hamstring Stretch 3 reps;20 seconds   Single Knee to Chest Stretch 3 reps;20 seconds     Lumbar Exercises: Standing   Other Standing Lumbar Exercises standing hip extension and abduction with abdominal bracing 2x10     Lumbar Exercises: Seated   Sit to Stand 10 reps   Sit to Stand Limitations standing on black balance pad     Lumbar Exercises: Supine   Bridge 15 reps   Bridge Limitations x2 sets with ball squeeze                  PT Short Term Goals - 07/18/17 1508      PT SHORT TERM GOAL #1   Title Pt will demo consistency and independence with her HEP to decrease pain.    Status Achieved           PT Long Term Goals - 07/18/17 1508      PT LONG TERM GOAL #2   Title Pt will report atleast 50% improvement in her symptoms from the start of therapy, to improve her ability  to complete daily activity with less difficulty.    Status Achieved               Plan - 07/18/17 1459    Clinical Impression Statement Pt reports 50-75% overall pain reduction in the lumbar spine and Rt LE.  Pt without pain x 1 week.  Pt reports compliance with HEP for flexibility and core strength.  Pt has also been making body mechanics modificaitons for lumbar protection.  Pt will continue to benefit from skilled PT for core strength, flexibility and endurance exercises.     Rehab Potential Good   PT Frequency 2x / week   PT Duration 6 weeks   PT Treatment/Interventions ADLs/Self Care Home Management;Electrical Stimulation;Cryotherapy;Moist Heat;Traction;Balance training;Therapeutic exercise;Therapeutic activities;Functional mobility training;Stair training;Neuromuscular re-education;Patient/family education;Passive range of motion;Manual techniques;Dry needling   PT Next Visit Plan focus on promoting proximal hip strength, hip flexibility and use of manual  techniques as needed to address pain and muscle spasm   Recommended Other Services initial certification is signed   Consulted and Agree with Plan of Care Patient      Patient will benefit from skilled therapeutic intervention in order to improve the following deficits and impairments:  Decreased activity tolerance, Decreased balance, Decreased range of motion, Decreased strength, Hypomobility, Decreased mobility, Improper body mechanics, Pain, Postural dysfunction, Increased muscle spasms, Impaired flexibility  Visit Diagnosis: Chronic low back pain with right-sided sciatica, unspecified back pain laterality  Muscle spasm of back  Pain in right hip  Muscle weakness (generalized)     Problem List Patient Active Problem List   Diagnosis Date Noted  . Sciatica, right side 05/17/2017  . Special screening for malignant neoplasms, colon 04/26/2011  . Diverticulosis of colon (without mention of hemorrhage) 04/26/2011  . ABDOMINAL PAIN, GENERALIZED 06/01/2010  . Dysphagia, pharyngoesophageal phase 01/08/2010  . METATARSALGIA 12/24/2008  . PLANTAR FASCIITIS, LEFT 12/24/2008  . ACUTE CYSTITIS 12/25/2007  . OSTEOARTHRITIS 05/09/2007  . DEPENDENT EDEMA 05/09/2007  . CHICKENPOX, HX OF 05/09/2007  . GERD 05/02/2007     Sigurd Sos, PT 07/18/17 3:34 PM  [facility] [address], [place], [place], [address] Phone: [phone]   Fax:  [phone]  Name: KARLE DESROSIER MRN: [id number] Date of Birth: December 11, 1940

## 2017-07-19 ENCOUNTER — Encounter: Payer: Self-pay | Admitting: Family Medicine

## 2017-07-19 ENCOUNTER — Ambulatory Visit (INDEPENDENT_AMBULATORY_CARE_PROVIDER_SITE_OTHER): Payer: Medicare Other | Admitting: Family Medicine

## 2017-07-19 VITALS — BP 120/60 | Temp 98.1°F | Ht 66.0 in | Wt 232.0 lb

## 2017-07-19 DIAGNOSIS — J018 Other acute sinusitis: Secondary | ICD-10-CM

## 2017-07-19 MED ORDER — AZITHROMYCIN 250 MG PO TABS
ORAL_TABLET | ORAL | 0 refills | Status: DC
Start: 2017-07-19 — End: 2017-08-31

## 2017-07-19 NOTE — Patient Instructions (Signed)
WE NOW OFFER   [facility] FAST TRACK!!!  SAME DAY Appointments for ACUTE CARE  Such as: Sprains, Injuries, cuts, abrasions, rashes, muscle pain, joint pain, back pain Colds, flu, sore throats, headache, allergies, cough, fever  Ear pain, sinus and eye infections Abdominal pain, nausea, vomiting, diarrhea, upset stomach Animal/insect bites  3 Easy Ways to Schedule: Walk-In Scheduling Call in scheduling Mychart Sign-up: [email or web address]         

## 2017-07-19 NOTE — Progress Notes (Signed)
   Subjective:    Patient ID: Candyce Churn, female    DOB: 25-Jun-1941, 76 y.o.   MRN: [id number]  HPI Here for 5 days of sinus pressure, PND, left eye irritation, and coughing up yellow sputum. No fever. Using Delsym.    Review of Systems  Constitutional: Negative.   HENT: Positive for congestion, postnasal drip, sinus pain and sinus pressure. Negative for sore throat.   Eyes: Positive for redness. Negative for pain and discharge.  Respiratory: Positive for cough.        Objective:   Physical Exam  Constitutional: She appears well-developed and well-nourished.  HENT:  Right Ear: External ear normal.  Left Ear: External ear normal.  Nose: Nose normal.  Mouth/Throat: Oropharynx is clear and moist.  Eyes: Conjunctivae are normal.  Neck: No thyromegaly present.  Pulmonary/Chest: Effort normal and breath sounds normal. No respiratory distress. She has no wheezes. She has no rales.  Lymphadenopathy:    She has no cervical adenopathy.          Assessment & Plan:  Sinusitis, treat with a Zpack.  Alysia Penna, MD

## 2017-07-20 ENCOUNTER — Ambulatory Visit: Payer: Medicare Other

## 2017-07-20 DIAGNOSIS — M25551 Pain in right hip: Secondary | ICD-10-CM

## 2017-07-20 DIAGNOSIS — G8929 Other chronic pain: Secondary | ICD-10-CM | POA: Diagnosis not present

## 2017-07-20 DIAGNOSIS — M62830 Muscle spasm of back: Secondary | ICD-10-CM | POA: Diagnosis not present

## 2017-07-20 DIAGNOSIS — M5441 Lumbago with sciatica, right side: Secondary | ICD-10-CM | POA: Diagnosis not present

## 2017-07-20 DIAGNOSIS — M6281 Muscle weakness (generalized): Secondary | ICD-10-CM | POA: Diagnosis not present

## 2017-07-20 NOTE — Therapy (Signed)
[facility] [address], [place], [place], [address] Phone: [phone]   Fax:  [phone]  Physical Therapy Treatment  Patient Details  Name: Crystal Little MRN: [id number] Date of Birth: [birth year] Referring Provider: Alysia Penna, MD   Encounter Date: 07/20/2017      PT End of Session - 07/20/17 1516    Visit Number 5   Number of Visits 10   Date for PT Re-Evaluation 08/15/17   Authorization Type [employer] Medicare    Authorization - Visit Number 5   Authorization - Number of Visits 10   PT Start Time 4332   PT Stop Time 1521   PT Time Calculation (min) 34 min   Activity Tolerance Patient tolerated treatment well   Behavior During Therapy [facility] for tasks assessed/performed      Past Medical History:  Diagnosis Date  . Diverticulosis   . Frequent UTI    sees Dr. Rosana Hoes  . GERD (gastroesophageal reflux disease)   . Gynecological examination    sees Dr. Sander Radon  . IBS (irritable bowel syndrome)   . Leg edema   . Osteoarthritis   . Status post dilation of esophageal narrowing   . Varicose veins of legs     Past Surgical History:  Procedure Laterality Date  . COLONOSCOPY  04-26-11   per Dr. Sharlett Iles, diverticulosis only, no repeats needed  . endovascular repair of bilateral leg varicosities     per Dr. Jones Skene   . ESOPHAGOGASTRODUODENOSCOPY  01-19-10   per Dr. Sharlett Iles, with dilatation   . ESOPHAGOGASTRODUODENOSCOPY (EGD) WITH ESOPHAGEAL DILATION    . right knee arthroscopy  12/17/05 and 01-06-12   per Dr. Percell Miller  . TONSILLECTOMY      There were no vitals filed for this visit.      Subjective Assessment - 07/20/17 1450    Subjective I have Rt calf pain   Currently in Pain? Yes   Pain Score 5    Pain Location Calf   Pain Orientation Right                         [facility] Adult PT Treatment/Exercise - 07/20/17 0001      Exercises   Exercises Knee/Hip     Lumbar  Exercises: Stretches   Active Hamstring Stretch 3 reps;20 seconds   Single Knee to Chest Stretch 3 reps;20 seconds     Lumbar Exercises: Standing   Other Standing Lumbar Exercises standing hip extension and abduction with abdominal bracing 2x10     Lumbar Exercises: Seated   Sit to Stand 10 reps   Sit to Stand Limitations standing on black balance pad     Lumbar Exercises: Supine   Clam 10 reps   Clam Limitations green TB, single leg with abdominal bracing, x2 sets    Bridge 15 reps   Bridge Limitations x2 sets with ball squeeze   Other Supine Lumbar Exercises ab set with ball adductor squeeze in hooklying 15x3 sec      Knee/Hip Exercises: Aerobic   Nustep Level 2 x 8 minutes  PT present to monitor pt                  PT Short Term Goals - 07/18/17 1508      PT SHORT TERM GOAL #1   Title Pt will demo consistency and independence with her HEP to decrease pain.    Status Achieved  PT Long Term Goals - 07/18/17 1508      PT LONG TERM GOAL #2   Title Pt will report atleast 50% improvement in her symptoms from the start of therapy, to improve her ability to complete daily activity with less difficulty.    Status Achieved             Patient will benefit from skilled therapeutic intervention in order to improve the following deficits and impairments:     Visit Diagnosis: Chronic low back pain with right-sided sciatica, unspecified back pain laterality  Muscle spasm of back  Pain in right hip  Muscle weakness (generalized)     Problem List Patient Active Problem List   Diagnosis Date Noted  . Sciatica, right side 05/17/2017  . Special screening for malignant neoplasms, colon 04/26/2011  . Diverticulosis of colon (without mention of hemorrhage) 04/26/2011  . ABDOMINAL PAIN, GENERALIZED 06/01/2010  . Dysphagia, pharyngoesophageal phase 01/08/2010  . METATARSALGIA 12/24/2008  . PLANTAR FASCIITIS, LEFT 12/24/2008  . ACUTE CYSTITIS  12/25/2007  . OSTEOARTHRITIS 05/09/2007  . DEPENDENT EDEMA 05/09/2007  . CHICKENPOX, HX OF 05/09/2007  . GERD 05/02/2007     Sigurd Sos, PT 07/20/17 3:24 PM  [facility] [address], [place], [place], [address] Phone: [phone]   Fax:  [phone]  Name: Crystal Little MRN: [id number] Date of Birth: 1940/11/05

## 2017-07-25 ENCOUNTER — Ambulatory Visit: Payer: Medicare Other | Admitting: Physical Therapy

## 2017-07-25 DIAGNOSIS — G8929 Other chronic pain: Secondary | ICD-10-CM | POA: Diagnosis not present

## 2017-07-25 DIAGNOSIS — M5441 Lumbago with sciatica, right side: Principal | ICD-10-CM

## 2017-07-25 DIAGNOSIS — M25551 Pain in right hip: Secondary | ICD-10-CM | POA: Diagnosis not present

## 2017-07-25 DIAGNOSIS — M6281 Muscle weakness (generalized): Secondary | ICD-10-CM | POA: Diagnosis not present

## 2017-07-25 DIAGNOSIS — M62830 Muscle spasm of back: Secondary | ICD-10-CM

## 2017-07-26 DIAGNOSIS — Z01419 Encounter for gynecological examination (general) (routine) without abnormal findings: Secondary | ICD-10-CM | POA: Diagnosis not present

## 2017-07-26 NOTE — Therapy (Signed)
[facility] [address] [place], [place], [address] Phone: [phone]   Fax:  [phone]  Physical Therapy Treatment  Patient Details  Name: Crystal Little MRN: [id number] Date of Birth: [birth year] Referring Provider: Alysia Penna, MD   Encounter Date: 07/25/2017      PT End of Session - 07/25/17 1502    Visit Number 6   Number of Visits 10   Date for PT Re-Evaluation 08/15/17   Authorization Type [employer] Medicare    Authorization - Visit Number 6   Authorization - Number of Visits 10   PT Start Time 6962   PT Stop Time 1528   PT Time Calculation (min) 43 min   Activity Tolerance Patient tolerated treatment well   Behavior During Therapy [facility] for tasks assessed/performed      Past Medical History:  Diagnosis Date  . Diverticulosis   . Frequent UTI    sees Dr. Rosana Hoes  . GERD (gastroesophageal reflux disease)   . Gynecological examination    sees Dr. Sander Radon  . IBS (irritable bowel syndrome)   . Leg edema   . Osteoarthritis   . Status post dilation of esophageal narrowing   . Varicose veins of legs     Past Surgical History:  Procedure Laterality Date  . COLONOSCOPY  04-26-11   per Dr. Sharlett Iles, diverticulosis only, no repeats needed  . endovascular repair of bilateral leg varicosities     per Dr. Jones Skene   . ESOPHAGOGASTRODUODENOSCOPY  01-19-10   per Dr. Sharlett Iles, with dilatation   . ESOPHAGOGASTRODUODENOSCOPY (EGD) WITH ESOPHAGEAL DILATION    . right knee arthroscopy  12/17/05 and 01-06-12   per Dr. Percell Miller  . TONSILLECTOMY      There were no vitals filed for this visit.      Subjective Assessment - 07/25/17 1446    Subjective Pt reports that things are going well today. She did have a flare-up over the weekend which she thinks might have been because she had been sitting alot. This morning she noticed some pain along her Rt calf.    Currently in Pain? No/denies                          [facility] Adult PT Treatment/Exercise - 07/26/17 0001      Lumbar Exercises: Standing   Other Standing Lumbar Exercises standing hip extension 2x10 reps each, yellow TB      Lumbar Exercises: Supine   Bent Knee Raise 10 reps   Bent Knee Raise Limitations feet propped on wedge   Large Ball Oblique Isometric 10 reps;2 seconds   Large Ball Oblique Isometric Limitations UE/LE press     Lumbar Exercises: Sidelying   Hip Abduction 10 reps   Hip Abduction Weights (lbs) 2                PT Education - 07/26/17 0742    Education provided Yes   Education Details reviewed safe exercises and equipment for pt to utilize at her local gym; technique with therex; reviewed low impact aerobic exercises that the pt can complete    Person(s) Educated Patient   Methods Explanation   Comprehension Verbalized understanding          PT Short Term Goals - 07/18/17 1508      PT SHORT TERM GOAL #1   Title Pt will demo consistency and independence with her HEP to decrease pain.    Status  Achieved           PT Long Term Goals - 07/18/17 1508      PT LONG TERM GOAL #2   Title Pt will report atleast 50% improvement in her symptoms from the start of therapy, to improve her ability to complete daily activity with less difficulty.    Status Achieved               Plan - 07/26/17 0743    Clinical Impression Statement Pt reporting exacerbation of her pain over the weekend after a period of prolonged sitting. This was resolved upon arrival to her session. Continued with therex to improve core strength and stability and therapist was able to address pt questions regarding equipment to use at the gym for transition into her own fitness program. Pt verbalized understanding and would continue to benefit from skilled PT for improved core strength and LE flexibility.   Rehab Potential Good   PT Frequency 2x / week   PT Duration 6 weeks   PT  Treatment/Interventions ADLs/Self Care Home Management;Electrical Stimulation;Cryotherapy;Moist Heat;Traction;Balance training;Therapeutic exercise;Therapeutic activities;Functional mobility training;Stair training;Neuromuscular re-education;Patient/family education;Passive range of motion;Manual techniques;Dry needling   PT Next Visit Plan focus on promoting proximal hip strength and update HEP to address more core stability, hip flexibility and use of manual techniques as needed to address pain and muscle spasm   PT Home Exercise Plan lumbar roll, long side hip abduction x10, supine piriformis stretch x3 reps, seated rows with green TB, supine abdominal bracing    Consulted and Agree with Plan of Care Patient      Patient will benefit from skilled therapeutic intervention in order to improve the following deficits and impairments:  Decreased activity tolerance, Decreased balance, Decreased range of motion, Decreased strength, Hypomobility, Decreased mobility, Improper body mechanics, Pain, Postural dysfunction, Increased muscle spasms, Impaired flexibility  Visit Diagnosis: Chronic low back pain with right-sided sciatica, unspecified back pain laterality  Muscle spasm of back  Pain in right hip  Muscle weakness (generalized)     Problem List Patient Active Problem List   Diagnosis Date Noted  . Sciatica, right side 05/17/2017  . Special screening for malignant neoplasms, colon 04/26/2011  . Diverticulosis of colon (without mention of hemorrhage) 04/26/2011  . ABDOMINAL PAIN, GENERALIZED 06/01/2010  . Dysphagia, pharyngoesophageal phase 01/08/2010  . METATARSALGIA 12/24/2008  . PLANTAR FASCIITIS, LEFT 12/24/2008  . ACUTE CYSTITIS 12/25/2007  . OSTEOARTHRITIS 05/09/2007  . DEPENDENT EDEMA 05/09/2007  . CHICKENPOX, HX OF 05/09/2007  . GERD 05/02/2007   7:48 AM,07/26/17 Elly Modena PT, DPT [facility] [address] [place], [place], [address] Phone: [phone]   Fax:  [phone]  Name: Crystal Little MRN: [id number] Date of Birth: 1941-05-24

## 2017-07-27 ENCOUNTER — Ambulatory Visit: Payer: Medicare Other | Admitting: Physical Therapy

## 2017-07-27 DIAGNOSIS — M62830 Muscle spasm of back: Secondary | ICD-10-CM

## 2017-07-27 DIAGNOSIS — M6281 Muscle weakness (generalized): Secondary | ICD-10-CM

## 2017-07-27 DIAGNOSIS — G8929 Other chronic pain: Secondary | ICD-10-CM | POA: Diagnosis not present

## 2017-07-27 DIAGNOSIS — M5441 Lumbago with sciatica, right side: Principal | ICD-10-CM

## 2017-07-27 DIAGNOSIS — M25551 Pain in right hip: Secondary | ICD-10-CM | POA: Diagnosis not present

## 2017-07-27 NOTE — Therapy (Signed)
[facility] [address] [place], [place], [address] Phone: [phone]   Fax:  [phone]  Physical Therapy Treatment  Patient Details  Name: Crystal Little MRN: [id number] Date of Birth: [birth year] Referring Provider: Alysia Penna, MD   Encounter Date: 07/27/2017      PT End of Session - 07/27/17 1525    Visit Number 7   Number of Visits 10   Date for PT Re-Evaluation 08/15/17   Authorization Type [employer] Medicare    Authorization - Visit Number 7   Authorization - Number of Visits 10   PT Start Time 8144   PT Stop Time 1726   PT Time Calculation (min) 39 min   Activity Tolerance Patient tolerated treatment well;No increased pain   Behavior During Therapy [facility] for tasks assessed/performed      Past Medical History:  Diagnosis Date  . Diverticulosis   . Frequent UTI    sees Dr. Rosana Hoes  . GERD (gastroesophageal reflux disease)   . Gynecological examination    sees Dr. Sander Radon  . IBS (irritable bowel syndrome)   . Leg edema   . Osteoarthritis   . Status post dilation of esophageal narrowing   . Varicose veins of legs     Past Surgical History:  Procedure Laterality Date  . COLONOSCOPY  04-26-11   per Dr. Sharlett Iles, diverticulosis only, no repeats needed  . endovascular repair of bilateral leg varicosities     per Dr. Jones Skene   . ESOPHAGOGASTRODUODENOSCOPY  01-19-10   per Dr. Sharlett Iles, with dilatation   . ESOPHAGOGASTRODUODENOSCOPY (EGD) WITH ESOPHAGEAL DILATION    . right knee arthroscopy  12/17/05 and 01-06-12   per Dr. Percell Miller  . TONSILLECTOMY      There were no vitals filed for this visit.      Subjective Assessment - 07/27/17 1453    Subjective Pt reports that she was able to go to the gym today and use some of the machines without much difficulty. She has no pain currently.   Currently in Pain? No/denies                         [facility] Adult PT Treatment/Exercise -  07/27/17 0001      Lumbar Exercises: Stretches   Piriformis Stretch 2 reps;30 seconds   Piriformis Stretch Limitations hip flexor stretch on steps 2x30 sec each      Lumbar Exercises: Standing   Other Standing Lumbar Exercises low rows with blue TB 2x15 reps, cues to increase lower trap activation      Lumbar Exercises: Seated   Other Seated Lumbar Exercises seated trunk rotation with green TB x10 reps each direction     Lumbar Exercises: Supine   Bent Knee Raise 15 reps   Bent Knee Raise Limitations abdominal bracing, knee flexion to extension for slow lowering to table    Large Ball Oblique Isometric 10 reps;5 seconds   Large Ball Oblique Isometric Limitations UE/LE press   Other Supine Lumbar Exercises Abdominal bracing with bent knee fallout x15 reps each; Dead bug alt heel tap 2x5 reps each    Other Supine Lumbar Exercises bent knee prop on red physioball with trunk/UE rotation Lt/Rt holding yellow weighted ball x10 reps                 PT Education - 07/27/17 1525    Education provided Yes   Education Details updated and reviewed HEP  Person(s) Educated Patient   Methods Explanation;Verbal cues;Handout   Comprehension Verbalized understanding;Returned demonstration          PT Short Term Goals - 07/27/17 1526      PT SHORT TERM GOAL #1   Title Pt will demo consistency and independence with her HEP to decrease pain.    Status Achieved           PT Long Term Goals - 07/27/17 1526      PT LONG TERM GOAL #1   Title Pt will demo improved BLE strength to 5/5 MMT which will improve her efficiency and safety with activity.    Time 6   Period Weeks   Status On-going     PT LONG TERM GOAL #2   Title Pt will report atleast 50% improvement in her symptoms from the start of therapy, to improve her ability to complete daily activity with less difficulty.    Time 6   Period Weeks   Status Achieved     PT LONG TERM GOAL #3   Title Pt will report she is able  to sit for atleast 30 min without increase in her symptoms, to allow for her to drive around town and complete errands.    Time 6   Period Weeks   Status On-going               Plan - 07/27/17 1527    Clinical Impression Statement Pt continues to make progress towards decreased pain levels and improving independence with her exercises program. She was able to return to the gym today without exacerbation of her pain and symptoms. Today focused on updating her HEP to address more deep abdominal activation and endurance to allow her to continue with her hip strengthening at the gym. Pt did demonstrate signs of muscle fatigue and shaking with several of today's exercises, however she overall demonstrates good understanding of technique with all exercises added to her home program. Therapist was able to progress to more upright exercises without difficulty as well. Will continue to progress this in future sessions to allow for full return to daily activity.   Rehab Potential Good   PT Frequency 2x / week   PT Duration 6 weeks   PT Treatment/Interventions ADLs/Self Care Home Management;Electrical Stimulation;Cryotherapy;Moist Heat;Traction;Balance training;Therapeutic exercise;Therapeutic activities;Functional mobility training;Stair training;Neuromuscular re-education;Patient/family education;Passive range of motion;Manual techniques;Dry needling   PT Next Visit Plan focus on promoting proximal hip strength, hip flexibility and trunk stability (upright) use of manual techniques as needed to address pain and muscle spasm   PT Home Exercise Plan see pt instructions from 07/27/17   Consulted and Agree with Plan of Care Patient      Patient will benefit from skilled therapeutic intervention in order to improve the following deficits and impairments:  Decreased activity tolerance, Decreased balance, Decreased range of motion, Decreased strength, Hypomobility, Decreased mobility, Improper body  mechanics, Pain, Postural dysfunction, Increased muscle spasms, Impaired flexibility  Visit Diagnosis: Chronic low back pain with right-sided sciatica, unspecified back pain laterality  Muscle spasm of back  Pain in right hip  Muscle weakness (generalized)     Problem List Patient Active Problem List   Diagnosis Date Noted  . Sciatica, right side 05/17/2017  . Special screening for malignant neoplasms, colon 04/26/2011  . Diverticulosis of colon (without mention of hemorrhage) 04/26/2011  . ABDOMINAL PAIN, GENERALIZED 06/01/2010  . Dysphagia, pharyngoesophageal phase 01/08/2010  . METATARSALGIA 12/24/2008  . PLANTAR FASCIITIS, LEFT 12/24/2008  . ACUTE CYSTITIS  12/25/2007  . OSTEOARTHRITIS 05/09/2007  . DEPENDENT EDEMA 05/09/2007  . CHICKENPOX, HX OF 05/09/2007  . GERD 05/02/2007    4:17 PM,07/27/17 Elly Modena PT, DPT [employer] [facility] [place] [facility] [address] [place], [place], [address] Phone: [phone]   Fax:  [phone]  Name: Crystal Little MRN: [id number] Date of Birth: 1940/11/05

## 2017-07-27 NOTE — Patient Instructions (Addendum)
  KNEE FALL OUT   While lying on your back with both knees bent, stabilize your spine by bracing your abdominal muscles. Hold this contraction as you slowly lower one knee to the side. Your pelvis should not move.   You can place your thumbs on your pelvic bone to get feedback of any movements that occur. If your pelvis moves too much, then next time lower the leg less to maintain good control.  x15 reps each   Supine Marching  Lie down on your back with the feet on the floor and your knees bent at 90 degrees. The low back should remain neutral or flat against the table through the whole exercise. Brace the abdominals and slowly raise one leg towards the ceiling while maintaining the same 90 degree bend through the knee. Stop when the lower leg is parallel with the floor. Straighten and slowly lower the leg back down to the starting position. Repeat the motion with the other leg. Keep a steady breathing pattern through the whole exercise. x10 reps     Dead Bug With Marching / Femur Arc   Lie down on your back with the legs up in the air and the knees and hips bent at 90 degrees. The low back should remain neutral or flat against the table through the whole exercise. Brace the abdominals and slowly lower one leg towards the table. Lightly tap the foot to the table and bring it back up to the starting position. Repeat the motion with the other leg. Try to maintain a 90 degree bend with both knees throughout the exercise. Keep a steady breathing pattern through the whole exercise. x2 sets, 5 taps each leg      Isometric Oblique Stabilization  From a supine position with both knees bent, bring one knee up to 90 degrees. Resist the lifted knee with the opposite arm to contract the obliques while keeping the head and shoulders on the mat. There should be no movement of the knee during the oblique contraction.  5 sec hold, repeat 10x on each side     ELASTIC BAND ROWS   Holding elastic band  with both hands, lift your chest, draw back the band as you bend your elbows. Keep your elbows near the side of your body.  2x15 reps (blue band)

## 2017-07-28 ENCOUNTER — Other Ambulatory Visit: Payer: Self-pay | Admitting: Obstetrics and Gynecology

## 2017-07-28 DIAGNOSIS — E2839 Other primary ovarian failure: Secondary | ICD-10-CM

## 2017-07-29 ENCOUNTER — Encounter: Payer: Medicare Other | Admitting: Physical Therapy

## 2017-08-01 ENCOUNTER — Ambulatory Visit: Payer: Medicare Other | Admitting: Physical Therapy

## 2017-08-01 DIAGNOSIS — M5441 Lumbago with sciatica, right side: Principal | ICD-10-CM

## 2017-08-01 DIAGNOSIS — G8929 Other chronic pain: Secondary | ICD-10-CM | POA: Diagnosis not present

## 2017-08-01 DIAGNOSIS — M25551 Pain in right hip: Secondary | ICD-10-CM

## 2017-08-01 DIAGNOSIS — M62830 Muscle spasm of back: Secondary | ICD-10-CM | POA: Diagnosis not present

## 2017-08-01 DIAGNOSIS — M6281 Muscle weakness (generalized): Secondary | ICD-10-CM | POA: Diagnosis not present

## 2017-08-01 NOTE — Patient Instructions (Signed)
  HIP FLEXOR STRETCH 4  While lying on a table or high bed, let the affected leg lower towards the floor until a stretch is felt along the front of your thigh.    At the same time, slowly bend your affected knee to add more stretch and grasp your opposite knee and pull it towards your chest.     hold 30 sec, 2x each leg

## 2017-08-02 DIAGNOSIS — M5441 Lumbago with sciatica, right side: Secondary | ICD-10-CM | POA: Diagnosis not present

## 2017-08-02 DIAGNOSIS — M25551 Pain in right hip: Secondary | ICD-10-CM | POA: Diagnosis not present

## 2017-08-02 DIAGNOSIS — G8929 Other chronic pain: Secondary | ICD-10-CM | POA: Diagnosis not present

## 2017-08-02 DIAGNOSIS — M62830 Muscle spasm of back: Secondary | ICD-10-CM | POA: Diagnosis not present

## 2017-08-02 DIAGNOSIS — M6281 Muscle weakness (generalized): Secondary | ICD-10-CM | POA: Diagnosis not present

## 2017-08-02 NOTE — Therapy (Signed)
[facility] [address] [place], [place], [address] Phone: [phone]   Fax:  [phone]  Physical Therapy Treatment/Discharge   Patient Details  Name: Crystal Little MRN: [id number] Date of Birth: 12/19/1940 Referring Provider: Alysia Penna, MD   Encounter Date: 08/01/2017      PT End of Session - 08/02/17 0743    Visit Number 8   Number of Visits 10   Date for PT Re-Evaluation 08/15/17   Authorization Type [employer] Medicare    Authorization - Visit Number 7   Authorization - Number of Visits 10   PT Start Time 2703   PT Stop Time 1730   PT Time Calculation (min) 41 min   Activity Tolerance Patient tolerated treatment well;No increased pain   Behavior During Therapy WFL for tasks assessed/performed      Past Medical History:  Diagnosis Date  . Diverticulosis   . Frequent UTI    sees Dr. Rosana Hoes  . GERD (gastroesophageal reflux disease)   . Gynecological examination    sees Dr. Sander Radon  . IBS (irritable bowel syndrome)   . Leg edema   . Osteoarthritis   . Status post dilation of esophageal narrowing   . Varicose veins of legs     Past Surgical History:  Procedure Laterality Date  . COLONOSCOPY  04-26-11   per Dr. Sharlett Iles, diverticulosis only, no repeats needed  . endovascular repair of bilateral leg varicosities     per Dr. Jones Skene   . ESOPHAGOGASTRODUODENOSCOPY  01-19-10   per Dr. Sharlett Iles, with dilatation   . ESOPHAGOGASTRODUODENOSCOPY (EGD) WITH ESOPHAGEAL DILATION    . right knee arthroscopy  12/17/05 and 01-06-12   per Dr. Percell Miller  . TONSILLECTOMY      There were no vitals filed for this visit.      Subjective Assessment - 08/01/17 1451    Subjective Pt reports that things are going well. She has no pain currently, but every now and then her back will flare up. She continues to go to the gym without any issues. She feels overall 75% improved and has minimal limitations with her  daily activity.    Currently in Pain? No/denies            [facility] PT Assessment - 08/02/17 0001      Assessment   Medical Diagnosis Low back pain with Rt sided sciatica    Referring Provider Alysia Penna, MD      Balance Screen   Has the patient fallen in the past 6 months No   Has the patient had a decrease in activity level because of a fear of falling?  No   Is the patient reluctant to leave their home because of a fear of falling?  No     Observation/Other Assessments   Focus on Therapeutic Outcomes (FOTO)  less than 40% limited      Strength   Right Hip Flexion 5/5   Right Hip Extension 5/5   Right Hip ABduction 5/5  No pain   Left Hip Flexion 5/5   Left Hip Extension 5/5   Left Hip ABduction 5/5   Right Knee Flexion 5/5   Right Knee Extension 5/5   Left Knee Flexion 5/5   Left Knee Extension 5/5   Right Ankle Dorsiflexion 5/5   Left Ankle Dorsiflexion 5/5     Flexibility   ITB pain free   Piriformis 25% limited  [facility] Adult PT Treatment/Exercise - 08/02/17 0001      Transfers   Five time sit to stand comments  10.7 sec, no UE support      Lumbar Exercises: Supine   Bent Knee Raise 15 reps   Bent Knee Raise Limitations BLE on red physioball    Other Supine Lumbar Exercises rolling Lt and Rt x15 reps each direction with BUE press into red physioball    Other Supine Lumbar Exercises bent knee prop on red physioball with trunk rotation Lt/Rt x15 reps                 PT Education - 08/02/17 0742    Education provided Yes   Education Details reviewed goals and HEP; discussed recommendations for continuing with HEP in order to prevent return of symptoms   Person(s) Educated Patient   Methods Explanation   Comprehension Verbalized understanding          PT Short Term Goals - 08/01/17 1456      PT SHORT TERM GOAL #1   Title Pt will demo consistency and independence with her HEP to decrease pain.    Time 3    Period Weeks   Status Achieved     PT SHORT TERM GOAL #2   Title Pt will demo proper use and set up of lumbar roll to improve her sitting posture and decrease symptoms throughout the day.    Baseline reports using this in the car with much improvement    Time 3   Period Weeks   Status Achieved           PT Long Term Goals - 08/01/17 1458      PT LONG TERM GOAL #1   Title Pt will demo improved BLE strength to 5/5 MMT which will improve her efficiency and safety with activity.    Time 6   Period Weeks   Status Achieved     PT LONG TERM GOAL #2   Title Pt will report atleast 50% improvement in her symptoms from the start of therapy, to improve her ability to complete daily activity with less difficulty.    Baseline 75% improved    Time 6   Period Weeks   Status Achieved     PT LONG TERM GOAL #3   Title Pt will report she is able to sit for atleast 30 min without increase in her symptoms, to allow for her to drive around town and complete errands.    Baseline 30 min, then gets up and walks before resuming    Time 6   Period Weeks   Status Achieved               Plan - 08/02/17 0743    Clinical Impression Statement Pt was discharged this visit having met all of her goals, demonstrating improved BLE strength, and reporting 75% resolution of her pain with daily activity. She is consistently completing her HEP and found significant relief with using her lumbar roll with sitting throughout the day. Currently, she has no pain with resistance testing and has returned to the local gym where she is completing a regular fitness regimen without reported difficulty. She does report limitations in sitting for more than 30 min at a time, however she is able to use techniques discussed during her sessions to help with this during her knitting. At this time, she is pleased with her progress and is agreeable with d/c to allow her to continue with her   HEP on her own.    Rehab Potential Good    PT Frequency 2x / week   PT Duration 6 weeks   PT Treatment/Interventions ADLs/Self Care Home Management;Electrical Stimulation;Cryotherapy;Moist Heat;Traction;Balance training;Therapeutic exercise;Therapeutic activities;Functional mobility training;Stair training;Neuromuscular re-education;Patient/family education;Passive range of motion;Manual techniques;Dry needling   PT Next Visit Plan pt discharged this visit    PT Home Exercise Plan see pt instructions from 07/27/17   Consulted and Agree with Plan of Care Patient      Patient will benefit from skilled therapeutic intervention in order to improve the following deficits and impairments:  Decreased activity tolerance, Decreased balance, Decreased range of motion, Decreased strength, Hypomobility, Decreased mobility, Improper body mechanics, Pain, Postural dysfunction, Increased muscle spasms, Impaired flexibility  Visit Diagnosis: Chronic low back pain with right-sided sciatica, unspecified back pain laterality  Muscle spasm of back  Pain in right hip  Muscle weakness (generalized)       G-Codes - 08-11-2017 1044    Functional Assessment Tool Used (Outpatient Only) FOTO   Functional Limitation Changing and maintaining body position   Changing and Maintaining Body Position Goal Status (U9811) At least 20 percent but less than 40 percent impaired, limited or restricted   Changing and Maintaining Body Position Discharge Status (B1478) At least 20 percent but less than 40 percent impaired, limited or restricted      Problem List Patient Active Problem List   Diagnosis Date Noted  . Sciatica, right side 05/17/2017  . Special screening for malignant neoplasms, colon 04/26/2011  . Diverticulosis of colon (without mention of hemorrhage) 04/26/2011  . ABDOMINAL PAIN, GENERALIZED 06/01/2010  . Dysphagia, pharyngoesophageal phase 01/08/2010  . METATARSALGIA 12/24/2008  . PLANTAR FASCIITIS, LEFT 12/24/2008  . ACUTE CYSTITIS  12/25/2007  . OSTEOARTHRITIS 05/09/2007  . DEPENDENT EDEMA 05/09/2007  . CHICKENPOX, HX OF 05/09/2007  . GERD 05/02/2007    PHYSICAL THERAPY DISCHARGE SUMMARY  Visits from Start of Care: 8  Current functional level related to goals / functional outcomes: See above for more details    Remaining deficits: See above for more details    Education / Equipment: See above for more details  Plan: Patient agrees to discharge.  Patient goals were met. Patient is being discharged due to meeting the stated rehab goals.  ?????     10:49 AM,August 11, 2017 Elly Modena PT, DPT [facility] [place]  [facility] [address], [place], [place], [address] Phone: [phone]   Fax:  [phone]  Name: Crystal Little MRN: [id number] Date of Birth: 11-20-1940

## 2017-08-18 ENCOUNTER — Ambulatory Visit
Admission: RE | Admit: 2017-08-18 | Discharge: 2017-08-18 | Disposition: A | Discharge status: Home / Self Care | Payer: Medicare Other | Source: Ambulatory Visit | Attending: Obstetrics and Gynecology | Admitting: Obstetrics and Gynecology

## 2017-08-18 DIAGNOSIS — E2839 Other primary ovarian failure: Secondary | ICD-10-CM

## 2017-08-18 DIAGNOSIS — M85851 Other specified disorders of bone density and structure, right thigh: Secondary | ICD-10-CM | POA: Diagnosis not present

## 2017-08-18 DIAGNOSIS — Z780 Asymptomatic menopausal state: Secondary | ICD-10-CM | POA: Diagnosis not present

## 2017-08-31 ENCOUNTER — Encounter: Payer: Self-pay | Admitting: Family Medicine

## 2017-08-31 ENCOUNTER — Ambulatory Visit (INDEPENDENT_AMBULATORY_CARE_PROVIDER_SITE_OTHER): Payer: Medicare Other | Admitting: Family Medicine

## 2017-08-31 VITALS — BP 120/62 | HR 75 | Temp 97.5°F | Wt 240.4 lb

## 2017-08-31 DIAGNOSIS — M62830 Muscle spasm of back: Secondary | ICD-10-CM | POA: Diagnosis not present

## 2017-08-31 DIAGNOSIS — M5431 Sciatica, right side: Secondary | ICD-10-CM | POA: Diagnosis not present

## 2017-08-31 DIAGNOSIS — M545 Low back pain: Secondary | ICD-10-CM | POA: Diagnosis not present

## 2017-08-31 DIAGNOSIS — J0141 Acute recurrent pansinusitis: Secondary | ICD-10-CM | POA: Diagnosis not present

## 2017-08-31 DIAGNOSIS — M9903 Segmental and somatic dysfunction of lumbar region: Secondary | ICD-10-CM | POA: Diagnosis not present

## 2017-08-31 MED ORDER — FLUCONAZOLE 150 MG PO TABS: 150.0000 mg | ORAL_TABLET | Freq: Once | ORAL | 3 refills | Status: AC

## 2017-08-31 MED ORDER — LEVOFLOXACIN 500 MG PO TABS: 500.0000 mg | ORAL_TABLET | Freq: Every day | ORAL | 0 refills | Status: AC

## 2017-08-31 NOTE — Progress Notes (Signed)
   Subjective:    Patient ID: Candyce Churn, female    DOB: Apr 28, 1941, 76 y.o.   MRN: [id number]  HPI Here for a recurrence of symptoms like sinus congestion and a dry cough. She was here about 6 weeks ago and was given a Zpack. This seemed to work well but the symptoms came back about 2 weeks ago. No fever.    Review of Systems  Constitutional: Negative.   HENT: Positive for congestion, postnasal drip and sinus pressure. Negative for sinus pain and sore throat.   Eyes: Negative.   Respiratory: Positive for cough.        Objective:   Physical Exam  Constitutional: She appears well-developed and well-nourished.  HENT:  Right Ear: External ear normal.  Left Ear: External ear normal.  Nose: Nose normal.  Mouth/Throat: Oropharynx is clear and moist.  Eyes: Conjunctivae are normal.  Neck: No thyromegaly present.  Pulmonary/Chest: Effort normal and breath sounds normal. No respiratory distress. She has no wheezes. She has no rales.  Lymphadenopathy:    She has no cervical adenopathy.          Assessment & Plan:  Sinusitis, treat with Levaquin.  Alysia Penna, MD

## 2017-09-01 DIAGNOSIS — M9903 Segmental and somatic dysfunction of lumbar region: Secondary | ICD-10-CM | POA: Diagnosis not present

## 2017-09-01 DIAGNOSIS — M545 Low back pain: Secondary | ICD-10-CM | POA: Diagnosis not present

## 2017-09-01 DIAGNOSIS — M62830 Muscle spasm of back: Secondary | ICD-10-CM | POA: Diagnosis not present

## 2017-09-01 DIAGNOSIS — M5431 Sciatica, right side: Secondary | ICD-10-CM | POA: Diagnosis not present

## 2017-09-05 ENCOUNTER — Ambulatory Visit (INDEPENDENT_AMBULATORY_CARE_PROVIDER_SITE_OTHER): Payer: Medicare Other | Admitting: Family Medicine

## 2017-09-05 ENCOUNTER — Encounter: Payer: Self-pay | Admitting: Family Medicine

## 2017-09-05 VITALS — BP 138/76 | HR 79 | Temp 97.3°F | Ht 64.5 in | Wt 239.8 lb

## 2017-09-05 DIAGNOSIS — M5431 Sciatica, right side: Secondary | ICD-10-CM | POA: Diagnosis not present

## 2017-09-05 DIAGNOSIS — M150 Primary generalized (osteo)arthritis: Secondary | ICD-10-CM | POA: Diagnosis not present

## 2017-09-05 DIAGNOSIS — M159 Polyosteoarthritis, unspecified: Secondary | ICD-10-CM

## 2017-09-05 DIAGNOSIS — K219 Gastro-esophageal reflux disease without esophagitis: Secondary | ICD-10-CM | POA: Diagnosis not present

## 2017-09-05 DIAGNOSIS — E785 Hyperlipidemia, unspecified: Secondary | ICD-10-CM | POA: Diagnosis not present

## 2017-09-05 DIAGNOSIS — M545 Low back pain: Secondary | ICD-10-CM | POA: Diagnosis not present

## 2017-09-05 DIAGNOSIS — M62830 Muscle spasm of back: Secondary | ICD-10-CM | POA: Diagnosis not present

## 2017-09-05 DIAGNOSIS — M9903 Segmental and somatic dysfunction of lumbar region: Secondary | ICD-10-CM | POA: Diagnosis not present

## 2017-09-05 LAB — POC URINALSYSI DIPSTICK (AUTOMATED)
BILIRUBIN UA: NEGATIVE
GLUCOSE UA: NEGATIVE
KETONES UA: NEGATIVE
LEUKOCYTES UA: NEGATIVE
Nitrite, UA: NEGATIVE
PH UA: 6.5 (ref 5.0–8.0)
Protein, UA: NEGATIVE
RBC UA: NEGATIVE
Spec Grav, UA: 1.015 (ref 1.010–1.025)
Urobilinogen, UA: 0.2 E.U./dL

## 2017-09-05 LAB — CBC WITH DIFFERENTIAL/PLATELET
BASOS PCT: 0.7 % (ref 0.0–3.0)
Basophils Absolute: 0.1 10*3/uL (ref 0.0–0.1)
EOS PCT: 2.4 % (ref 0.0–5.0)
Eosinophils Absolute: 0.2 10*3/uL (ref 0.0–0.7)
HEMATOCRIT: 35.7 % — AB (ref 36.0–46.0)
HEMOGLOBIN: 11.6 g/dL — AB (ref 12.0–15.0)
LYMPHS PCT: 29.2 % (ref 12.0–46.0)
Lymphs Abs: 2.7 10*3/uL (ref 0.7–4.0)
MCHC: 32.6 g/dL (ref 30.0–36.0)
MCV: 92.5 fl (ref 78.0–100.0)
MONOS PCT: 8.3 % (ref 3.0–12.0)
Monocytes Absolute: 0.8 10*3/uL (ref 0.1–1.0)
NEUTROS ABS: 5.5 10*3/uL (ref 1.4–7.7)
Neutrophils Relative %: 59.4 % (ref 43.0–77.0)
PLATELETS: 305 10*3/uL (ref 150.0–400.0)
RBC: 3.85 Mil/uL — ABNORMAL LOW (ref 3.87–5.11)
RDW: 14.1 % (ref 11.5–15.5)
WBC: 9.2 10*3/uL (ref 4.0–10.5)

## 2017-09-05 LAB — TSH: TSH: 2.55 u[IU]/mL (ref 0.35–4.50)

## 2017-09-05 MED ORDER — FUROSEMIDE 20 MG PO TABS: 20.0000 mg | ORAL_TABLET | Freq: Every day | ORAL | 3 refills | Status: DC

## 2017-09-05 MED ORDER — OMEPRAZOLE 40 MG PO CPDR: 40.0000 mg | DELAYED_RELEASE_CAPSULE | Freq: Every day | ORAL | 3 refills | Status: DC

## 2017-09-05 NOTE — Progress Notes (Signed)
   Subjective:    Patient ID: Crystal Little, female    DOB: [birth year], 76 y.o.   MRN: [id number]  HPI Here to follow up on issues. She feels well except for the low back pain and right sided sciatica. She is seeing Dr. Limmie Patricia for chiropractic care, and she is not sure if this is helping or not. Her GERD is stable. She watches her diet closely. She has recovered from a recent sinus infection.    Review of Systems  Constitutional: Negative.   HENT: Negative.   Eyes: Negative.   Respiratory: Negative.   Cardiovascular: Negative.   Gastrointestinal: Negative.   Genitourinary: Negative for decreased urine volume, difficulty urinating, dyspareunia, dysuria, enuresis, flank pain, frequency, hematuria, pelvic pain and urgency.  Musculoskeletal: Negative.   Skin: Negative.   Neurological: Negative.   Psychiatric/Behavioral: Negative.        Objective:   Physical Exam  Constitutional: She is oriented to person, place, and time. She appears well-developed and well-nourished. No distress.  HENT:  Head: Normocephalic and atraumatic.  Right Ear: External ear normal.  Left Ear: External ear normal.  Nose: Nose normal.  Mouth/Throat: Oropharynx is clear and moist. No oropharyngeal exudate.  Eyes: Conjunctivae and EOM are normal. Pupils are equal, round, and reactive to light. No scleral icterus.  Neck: Normal range of motion. Neck supple. No JVD present. No thyromegaly present.  Cardiovascular: Normal rate, regular rhythm, normal heart sounds and intact distal pulses. Exam reveals no gallop and no friction rub.  No murmur heard. Pulmonary/Chest: Effort normal and breath sounds normal. No respiratory distress. She has no wheezes. She has no rales. She exhibits no tenderness.  Abdominal: Soft. Bowel sounds are normal. She exhibits no distension and no mass. There is no tenderness. There is no rebound and no guarding.  Musculoskeletal: Normal range of motion. She exhibits no edema or  tenderness.  Lymphadenopathy:    She has no cervical adenopathy.  Neurological: She is alert and oriented to person, place, and time. She has normal reflexes. No cranial nerve deficit. She exhibits normal muscle tone. Coordination normal.  Skin: Skin is warm and dry. No rash noted. No erythema.  Psychiatric: She has a normal mood and affect. Her behavior is normal. Judgment and thought content normal.          Assessment & Plan:  Her GERD is stable. She is getting chiropractic treatments for her sciatica. Get fasting labs today to check her lipids, etc. Her sinusitis has resolved.  Alysia Penna, MD

## 2017-09-06 LAB — HEPATIC FUNCTION PANEL
ALK PHOS: 70 U/L (ref 39–117)
ALT: 11 U/L (ref 0–35)
AST: 18 U/L (ref 0–37)
Albumin: 4.1 g/dL (ref 3.5–5.2)
BILIRUBIN DIRECT: 0.1 mg/dL (ref 0.0–0.3)
BILIRUBIN TOTAL: 0.6 mg/dL (ref 0.2–1.2)
Total Protein: 7.1 g/dL (ref 6.0–8.3)

## 2017-09-06 LAB — BASIC METABOLIC PANEL
BUN: 18 mg/dL (ref 6–23)
CALCIUM: 9.6 mg/dL (ref 8.4–10.5)
CHLORIDE: 102 meq/L (ref 96–112)
CO2: 29 mEq/L (ref 19–32)
CREATININE: 0.89 mg/dL (ref 0.40–1.20)
GFR: 65.2 mL/min (ref >60.00)
Glucose, Bld: 96 mg/dL (ref 70–99)
POTASSIUM: 4.3 meq/L (ref 3.5–5.1)
Sodium: 141 mEq/L (ref 135–145)

## 2017-09-07 DIAGNOSIS — M62830 Muscle spasm of back: Secondary | ICD-10-CM | POA: Diagnosis not present

## 2017-09-07 DIAGNOSIS — M5431 Sciatica, right side: Secondary | ICD-10-CM | POA: Diagnosis not present

## 2017-09-07 DIAGNOSIS — M9903 Segmental and somatic dysfunction of lumbar region: Secondary | ICD-10-CM | POA: Diagnosis not present

## 2017-09-07 DIAGNOSIS — M545 Low back pain: Secondary | ICD-10-CM | POA: Diagnosis not present

## 2017-09-07 LAB — LIPID PANEL
CHOL/HDL RATIO: 2
Cholesterol: 164 mg/dL (ref 0–200)
HDL: 67.2 mg/dL (ref >39.00)
LDL Cholesterol: 84 mg/dL (ref 0–99)
NONHDL: 96.96
Triglycerides: 63 mg/dL (ref 0.0–149.0)
VLDL: 12.6 mg/dL (ref 0.0–40.0)

## 2017-09-08 DIAGNOSIS — M62830 Muscle spasm of back: Secondary | ICD-10-CM | POA: Diagnosis not present

## 2017-09-08 DIAGNOSIS — M545 Low back pain: Secondary | ICD-10-CM | POA: Diagnosis not present

## 2017-09-08 DIAGNOSIS — M9903 Segmental and somatic dysfunction of lumbar region: Secondary | ICD-10-CM | POA: Diagnosis not present

## 2017-09-08 DIAGNOSIS — M5431 Sciatica, right side: Secondary | ICD-10-CM | POA: Diagnosis not present

## 2017-09-20 DIAGNOSIS — R600 Localized edema: Secondary | ICD-10-CM | POA: Diagnosis not present

## 2017-09-20 DIAGNOSIS — M79604 Pain in right leg: Secondary | ICD-10-CM | POA: Diagnosis not present

## 2017-09-20 DIAGNOSIS — M79605 Pain in left leg: Secondary | ICD-10-CM | POA: Diagnosis not present

## 2017-09-29 ENCOUNTER — Telehealth: Payer: Self-pay | Admitting: Family Medicine

## 2017-09-29 DIAGNOSIS — M5431 Sciatica, right side: Secondary | ICD-10-CM

## 2017-09-29 NOTE — Telephone Encounter (Signed)
Copied from [place] [id number]. Topic: Referral - Request >> Sep 29, 2017  3:05 PM Pricilla Handler wrote: Reason for CRM: Patient has requested a referral from Dr. Sarajane Jews to Dr. Nelva Bush at [facility] for her Sciatica. Patient states that she only wants to see Dr. Nelva Bush at [facility]. Patient has requested a call once referral has been made.       Thank You!!!

## 2017-09-30 NOTE — Telephone Encounter (Signed)
Called pt and left a VM that PCP is out of the office will be back on Monday. Sent to PCP to place referral.

## 2017-10-03 NOTE — Telephone Encounter (Signed)
Pt advised and voiced understanding.   

## 2017-10-03 NOTE — Telephone Encounter (Signed)
The referral was done  

## 2017-10-07 ENCOUNTER — Ambulatory Visit: Payer: Medicare Other

## 2017-10-11 DIAGNOSIS — I8311 Varicose veins of right lower extremity with inflammation: Secondary | ICD-10-CM | POA: Diagnosis not present

## 2017-10-11 DIAGNOSIS — I83893 Varicose veins of bilateral lower extremities with other complications: Secondary | ICD-10-CM | POA: Diagnosis not present

## 2017-10-11 DIAGNOSIS — I8312 Varicose veins of left lower extremity with inflammation: Secondary | ICD-10-CM | POA: Diagnosis not present

## 2017-10-11 DIAGNOSIS — I83813 Varicose veins of bilateral lower extremities with pain: Secondary | ICD-10-CM | POA: Diagnosis not present

## 2017-10-11 NOTE — Progress Notes (Addendum)
Subjective:   Crystal Little is a 77 y.o. female who presents for Medicare Annual (Initial) preventive examination.  No prior AWV- preventive health exam in 09/2016 Widowed 3 children  One lives here and one in [place] and one is career [employer] children [place]; likes to knit;   Remains in her family home;  It is to big; is considering options  States her neighbors  are wonderful Spouse just died 12-05-16  Mother died 73; she was 48  Presents today with fit-bit   Diet Chol/hdl ratio 2 BMI 38 Sciatica running down right leg Now has apt with [facility] end of month  Breakfast; egg; waffle; or other breakfast food Lunch; salad, sandwich Supper eat a meat and vegetables or salad May not eat dinner, depending on when she ate lunch    Exercise Was going 2 to 3 days a week  HDL 67 Using fitbit;  Can't get on the treadmill A lot of exercises she cannot do  Hard to sit for any length of time  Pain varies Sometimes a 10 and sometimes a 1  MRI showed a bulging disc   ETOH occa Tobacco never smoked  No secondary smoke    There are no preventive care reminders to display for this patient.  Colonoscopy 04/2011 - she will not repeat at this time; no hx of colon cancer Mammogram 08/2012 - scheduled a mammogram this week [facility]  GYN dr. Allyn Kenner  Dexa 08/2017 -1.9;  Gyn managing both dexa and mammogram  Pap 10/2009; abnormal  Zoster in 08/2012; Discussed Shingrix Due a Tdap in 2021 or next dose    Objective:     Vitals: BP 110/60   Pulse 88   Ht 5\' 5"  (1.651 m)   Wt 232 lb (105.2 kg)   SpO2 93%   BMI 38.61 kg/m   Body mass index is 38.61 kg/m.  Advanced Directives 10/12/2017 07/04/2017 02/18/2015 01/03/2012  Does Patient Have a Medical Advance Directive? Yes Yes Yes Patient has advance directive, copy not in chart  Type of Advance Directive - [place];Living will - -  Copy of [place] in  Chart? - No - copy requested No - copy requested -    Tobacco Social History   Tobacco Use  Smoking Status Never Smoker  Smokeless Tobacco Never Used     Counseling given: Yes   Clinical Intake:     Past Medical History:  Diagnosis Date  . Diverticulosis   . Frequent UTI    sees Dr. Rosana Hoes  . GERD (gastroesophageal reflux disease)   . Gynecological examination    sees Dr. Sander Radon  . IBS (irritable bowel syndrome)   . Leg edema   . Osteoarthritis   . Status post dilation of esophageal narrowing   . Varicose veins of legs    Past Surgical History:  Procedure Laterality Date  . COLONOSCOPY  04-26-11   per Dr. Sharlett Iles, diverticulosis only, no repeats needed  . endovascular repair of bilateral leg varicosities     per Dr. Jones Skene   . ESOPHAGOGASTRODUODENOSCOPY  01-19-10   per Dr. Sharlett Iles, with dilatation   . ESOPHAGOGASTRODUODENOSCOPY (EGD) WITH ESOPHAGEAL DILATION    . right knee arthroscopy  12/17/05 and 01-06-12   per Dr. Percell Miller  . TONSILLECTOMY     Family History  Problem Relation Age of Onset  . Irritable bowel syndrome Mother   . Pancreatic cancer Paternal Grandmother   .  Diabetes Maternal Aunt   . Diabetes Maternal Uncle   . Heart disease Paternal Grandfather   . Colon cancer Neg Hx   . Colon polyps Neg Hx   . Kidney disease Neg Hx    Social History   Socioeconomic History  . Marital status: Married    Spouse name: Not on file  . Number of children: 3  . Years of education: Not on file  . Highest education level: Not on file  Social Needs  . Financial resource strain: Not on file  . Food insecurity - worry: Not on file  . Food insecurity - inability: Not on file  . Transportation needs - medical: Not on file  . Transportation needs - non-medical: Not on file  Occupational History  . Occupation: [employer] photographer  Tobacco Use  . Smoking status: Never Smoker  . Smokeless tobacco: Never Used  Substance and Sexual Activity  . Alcohol use: Yes     Alcohol/week: 1.2 oz    Types: 2 Glasses of wine per week    Comment: occ  . Drug use: No  . Sexual activity: Not on file  Other Topics Concern  . Not on file  Social History Narrative  . Not on file    Outpatient Encounter Medications as of 10/12/2017  Medication Sig  . aspirin 81 MG tablet Take 81 mg by mouth daily.    . Calcium Carbonate-Vit D-Min (CALCIUM 1200 PO) Take by mouth once.  . Cholecalciferol (VITAMIN D) 2000 UNITS CAPS Take by mouth.    . cyanocobalamin 1000 MCG tablet Take 1,000 mcg by mouth daily.  . cyclobenzaprine (FLEXERIL) 10 MG tablet Take 1 tablet (10 mg total) by mouth 3 (three) times daily as needed for muscle spasms.  . furosemide (LASIX) 20 MG tablet Take 1 tablet (20 mg total) by mouth daily.  Marland Kitchen GLUCOSAMINE SULFATE PO Take 2,000 mg by mouth 2 (two) times daily.  . Naproxen Sodium (ALEVE PO) Take 2 tablets by mouth as needed. Pt uses for leg ache from varicose veins  . Omega-3 Fatty Acids (FISH OIL) 1000 MG CAPS Take 2 capsules by mouth daily.  Marland Kitchen omeprazole (PRILOSEC) 40 MG capsule Take 1 capsule (40 mg total) by mouth daily.   No facility-administered encounter medications on file as of 10/12/2017.     Activities of Daily Living In your present state of health, do you have any difficulty performing the following activities: 10/12/2017  Hearing? N  Vision? N  Difficulty concentrating or making decisions? N  Walking or climbing stairs? N  Dressing or bathing? N  Doing errands, shopping? N  Preparing Food and eating ? N  Using the Toilet? N  In the past six months, have you accidently leaked urine? N  Do you have problems with loss of bowel control? N  Managing your Medications? N  Managing your Finances? N  Housekeeping or managing your Housekeeping? N  Some recent data might be hidden    Patient Care Team: Laurey Morale, MD as PCP - General    Dr. Elon Spanner  Assessment:   This is a routine wellness examination for  [name].  Exercise Activities and Dietary recommendations Current Exercise Habits: Home exercise routine;The patient does not participate in regular exercise at present, Exercise limited by: neurologic condition(s);Other - see comments  Goals    . Patient Stated     Continue to work through your grief  Continue with grief care        Fall  Risk Fall Risk  10/12/2017 09/05/2017 08/27/2015  Falls in the past year? No No Yes  Number falls in past yr: - - 1  Injury with Fall? - - No  Follow up - - Falls evaluation completed   Falls can be the main reason people lose their independence Think before you CLIMB  4 things you can do to prevent falls 1. Begin an exercise program to improve your leg strength and balance 2. Ask the doctor to review medicines for fall risk 3. Get an annual eye check up and update your eye glasses;  (the [employer] still assist with eyewear:  Reviewed for annual vision exam;The [facility] assistance for eyewear is coordinated through [facility]; Please call Cherlyn Labella at [phone] 4. Make your home safer by: Removing clutter and tripping hazzards Putting railing on stairs and adding grab bars to the bathroom  Have good lighting; especially on stairs and at night when getting up to the bathroom   Exercise; including walking can assist with maintaining tone and balance and help prevent falls as you age.      Depression Screen PHQ 2/9 Scores 10/12/2017 09/05/2017 08/27/2015  PHQ - 2 Score 0 0 0    In Grief support due to the loss of her spouse 1 year ago in Feb  Cognitive Function MMSE - Mini Mental State Exam 10/12/2017  Not completed: (No Data)     Ad8 score reviewed for issues:  Issues making decisions:  Less interest in hobbies / activities:  Repeats questions, stories (family complaining):  Trouble using ordinary gadgets (microwave, computer, phone):  Forgets the month or year:   Mismanaging finances:   Remembering appts:  Daily problems with  thinking and/or memory: Ad8 score is=0        Immunization History  Administered Date(s) Administered  . Influenza, High Dose Seasonal PF 07/31/2015, 07/06/2016  . Influenza,inj,Quad PF,6+ Mos 06/20/2013, 06/24/2014  . Influenza-Unspecified 07/05/2017  . Pneumococcal Conjugate-13 06/24/2014  . Pneumococcal Polysaccharide-23 10/09/2009  . Td 10/09/2009  . Zoster 08/26/2012      Screening Tests Health Maintenance  Topic Date Due  . TETANUS/TDAP  10/10/2019  . INFLUENZA VACCINE  Completed  . DEXA SCAN  Completed  . PNA vac Low Risk Adult  Completed         Plan:      PCP Notes  Health Maintenance GYN is following dexa and Mammogram. States her mammogram is scheduled for this week and then her doctor will discuss dexa as well   Waiting on shingrix at this time   Discussed Tdap with pertussis if she takes one in the future.   Has not exercised due to sciatica and is in process of being evaluated  Abnormal Screens  None;   Referrals  None; discussed hearing and may go [facility]   Patient concerns; Continue to work through her grief for loss of spouse in Feb 2018; in grief share at church and good support there and in her neighborhood   Continues to have "sciatica" like pain evaluated   Nurse Concerns; As noted   Next PCP [facility]; was recently seen Labs were 09/05/2017  Seeing Dr. Allyn Kenner Jan 10       I have personally reviewed and noted the following in the patient's chart:   . Medical and social history . Use of alcohol, tobacco or illicit drugs  . Current medications and supplements . Functional ability and status . Nutritional status . Physical activity .  Advanced directives . List of other physicians . Hospitalizations, surgeries, and ER visits in previous 12 months . Vitals . Screenings to include cognitive, depression, and falls . Referrals and appointments  In addition, I have reviewed and discussed with  patient certain preventive protocols, quality metrics, and best practice recommendations. A written personalized care plan for preventive services as well as general preventive health recommendations were provided to patient.     AVWPV,XYIAX, RN  10/12/2017   I have reviewed this note and agree with its contents. Alysia Penna, MD

## 2017-10-12 ENCOUNTER — Ambulatory Visit (INDEPENDENT_AMBULATORY_CARE_PROVIDER_SITE_OTHER): Payer: Medicare Other

## 2017-10-12 VITALS — BP 110/60 | HR 88 | Ht 65.0 in | Wt 232.0 lb

## 2017-10-12 DIAGNOSIS — Z0000 Encounter for general adult medical examination without abnormal findings: Secondary | ICD-10-CM

## 2017-10-12 NOTE — Patient Instructions (Addendum)
Ms. Crystal Little , Thank you for taking time to come for your Medicare Wellness Visit. I appreciate your ongoing commitment to your health goals. Please review the following plan we discussed and let me know if I can assist you in the future.   Recommendations for Dexa Scan Female over the age of 94 Man age 79 or older If you broke a bone past the age of 44 Women menopausal age with risk factors (thin frame; smoker; hx of fx ) Post menopausal women under the age of 46 with risk factors A man age 65 to 16 with risk factors Other: Spine xray that is showing break of bone loss Back pain with possible break Height loss of 1/2 inch or more within one year Total loss in height of 1.5 inches from your original height  Calcium 127m with Vit D 800u per day; more as directed by physician Strength building exercises discussed; can include walking; housework; small weights or stretch bands; silver sneakers if access to the [facility]  Please visit the osteoporosis foundation.org for up to date recommendations  Shingrix is a vaccine for the prevention of Shingles in Adults 50 and older.  If you are on Medicare, you can request a prescription from your doctor to be filled at a pharmacy.  Please check with your benefits regarding applicable copays or out of pocket expenses.  The Shingrix is given in 2 vaccines approx 8 weeks apart. You must receive the 2nd dose prior to 6 months from receipt of the first.   When you have a tetanus in the future, take the one with the pertussis   Prevention of falls: Remove rugs or any tripping hazards in the home Use Non slip mats in bathtubs and showers Placing grab bars next to the toilet and or shower Placing handrails on both sides of the stair way Adding extra lighting in the home.   Personal safety issues reviewed:  1. Consider starting a [facility] program [facility].  Changes batteries is smoke detector and/or carbon monoxide detector   3.  If you have firearms; keep them in a safe place 4.  Wear protection when in the sun; Always wear sunscreen or a hat; It is good to have your doctor check your skin annually or review any new areas of concern 5. Driving safety; Keep in the right lane; stay 3 car lengths behind the car in front of you on the highway; look 3 times prior to pulling out; carry your cell phone everywhere you go!      These are the goals we discussed: Goals    . Patient Stated     Continue to work through your grief  Continue with grief care        This is a list of the screening recommended for you and due dates:  Health Maintenance  Topic Date Due  . Tetanus Vaccine  10/10/2019  . Flu Shot  Completed  . DEXA scan (bone density measurement)  Completed  . Pneumonia vaccines  Completed    Health Maintenance for Postmenopausal Women Menopause is a normal process in which your reproductive ability comes to an end. This process happens gradually over a span of months to years, usually between the ages of 452and 568 Menopause is complete when you have missed 12 consecutive menstrual periods. It is important to talk with your health care provider about some of the most common conditions that affect postmenopausal women, such as heart disease, cancer, and  bone loss (osteoporosis). Adopting a healthy lifestyle and getting preventive care can help to promote your health and wellness. Those actions can also lower your chances of developing some of these common conditions. What should I know about menopause? During menopause, you may experience a number of symptoms, such as:  Moderate-to-severe hot flashes.  Night sweats.  Decrease in sex drive.  Mood swings.  Headaches.  Tiredness.  Irritability.  Memory problems.  Insomnia.  Choosing to treat or not to treat menopausal changes is an individual decision that you make with your health care provider. What should I know about hormone replacement  therapy and supplements? Hormone therapy products are effective for treating symptoms that are associated with menopause, such as hot flashes and night sweats. Hormone replacement carries certain risks, especially as you become older. If you are thinking about using estrogen or estrogen with progestin treatments, discuss the benefits and risks with your health care provider. What should I know about heart disease and stroke? Heart disease, heart attack, and stroke become more likely as you age. This may be due, in part, to the hormonal changes that your body experiences during menopause. These can affect how your body processes dietary fats, triglycerides, and cholesterol. Heart attack and stroke are both medical emergencies. There are many things that you can do to help prevent heart disease and stroke:  Have your blood pressure checked at least every 1-2 years. High blood pressure causes heart disease and increases the risk of stroke.  If you are 61-80 years old, ask your health care provider if you should take aspirin to prevent a heart attack or a stroke.  Do not use any tobacco products, including cigarettes, chewing tobacco, or electronic cigarettes. If you need help quitting, ask your health care provider.  It is important to eat a healthy diet and maintain a healthy weight. ? Be sure to include plenty of vegetables, fruits, low-fat dairy products, and lean protein. ? Avoid eating foods that are high in solid fats, added sugars, or salt (sodium).  Get regular exercise. This is one of the most important things that you can do for your health. ? Try to exercise for at least 150 minutes each week. The type of exercise that you do should increase your heart rate and make you sweat. This is known as moderate-intensity exercise. ? Try to do strengthening exercises at least twice each week. Do these in addition to the moderate-intensity exercise.  Know your numbers.Ask your health care provider  to check your cholesterol and your blood glucose. Continue to have your blood tested as directed by your health care provider.  What should I know about cancer screening? There are several types of cancer. Take the following steps to reduce your risk and to catch any cancer development as early as possible. Breast Cancer  Practice breast self-awareness. ? This means understanding how your breasts normally appear and feel. ? It also means doing regular breast self-exams. Let your health care provider know about any changes, no matter how small.  If you are 67 or older, have a clinician do a breast exam (clinical breast exam or CBE) every year. Depending on your age, family history, and medical history, it may be recommended that you also have a yearly breast X-ray (mammogram).  If you have a family history of breast cancer, talk with your health care provider about genetic screening.  If you are at high risk for breast cancer, talk with your health care provider about having  an MRI and a mammogram every year.  Breast cancer (BRCA) gene test is recommended for women who have family members with BRCA-related cancers. Results of the assessment will determine the need for genetic counseling and BRCA1 and for BRCA2 testing. BRCA-related cancers include these types: ? Breast. This occurs in males or females. ? Ovarian. ? Tubal. This may also be called fallopian tube cancer. ? Cancer of the abdominal or pelvic lining (peritoneal cancer). ? Prostate. ? Pancreatic.  Cervical, Uterine, and Ovarian Cancer Your health care provider may recommend that you be screened regularly for cancer of the pelvic organs. These include your ovaries, uterus, and vagina. This screening involves a pelvic exam, which includes checking for microscopic changes to the surface of your cervix (Pap test).  For women ages 21-65, health care providers may recommend a pelvic exam and a Pap test every three years. For women ages  56-65, they may recommend the Pap test and pelvic exam, combined with testing for human papilloma virus (HPV), every five years. Some types of HPV increase your risk of cervical cancer. Testing for HPV may also be done on women of any age who have unclear Pap test results.  Other health care providers may not recommend any screening for nonpregnant women who are considered low risk for pelvic cancer and have no symptoms. Ask your health care provider if a screening pelvic exam is right for you.  If you have had past treatment for cervical cancer or a condition that could lead to cancer, you need Pap tests and screening for cancer for at least 20 years after your treatment. If Pap tests have been discontinued for you, your risk factors (such as having a new sexual partner) need to be reassessed to determine if you should start having screenings again. Some women have medical problems that increase the chance of getting cervical cancer. In these cases, your health care provider may recommend that you have screening and Pap tests more often.  If you have a family history of uterine cancer or ovarian cancer, talk with your health care provider about genetic screening.  If you have vaginal bleeding after reaching menopause, tell your health care provider.  There are currently no reliable tests available to screen for ovarian cancer.  Lung Cancer Lung cancer screening is recommended for adults 25-73 years old who are at high risk for lung cancer because of a history of smoking. A yearly low-dose CT scan of the lungs is recommended if you:  Currently smoke.  Have a history of at least 30 pack-years of smoking and you currently smoke or have quit within the past 15 years. A pack-year is smoking an average of one pack of cigarettes per day for one year.  Yearly screening should:  Continue until it has been 15 years since you quit.  Stop if you develop a health problem that would prevent you from having  lung cancer treatment.  Colorectal Cancer  This type of cancer can be detected and can often be prevented.  Routine colorectal cancer screening usually begins at age 63 and continues through age 63.  If you have risk factors for colon cancer, your health care provider may recommend that you be screened at an earlier age.  If you have a family history of colorectal cancer, talk with your health care provider about genetic screening.  Your health care provider may also recommend using home test kits to check for hidden blood in your stool.  A small camera at the  end of a tube can be used to examine your colon directly (sigmoidoscopy or colonoscopy). This is done to check for the earliest forms of colorectal cancer.  Direct examination of the colon should be repeated every 5-10 years until age 33. However, if early forms of precancerous polyps or small growths are found or if you have a family history or genetic risk for colorectal cancer, you may need to be screened more often.  Skin Cancer  Check your skin from head to toe regularly.  Monitor any moles. Be sure to tell your health care provider: ? About any new moles or changes in moles, especially if there is a change in a mole's shape or color. ? If you have a mole that is larger than the size of a pencil eraser.  If any of your family members has a history of skin cancer, especially at a young age, talk with your health care provider about genetic screening.  Always use sunscreen. Apply sunscreen liberally and repeatedly throughout the day.  Whenever you are outside, protect yourself by wearing long sleeves, pants, a wide-brimmed hat, and sunglasses.  What should I know about osteoporosis? Osteoporosis is a condition in which bone destruction happens more quickly than new bone creation. After menopause, you may be at an increased risk for osteoporosis. To help prevent osteoporosis or the bone fractures that can happen because of  osteoporosis, the following is recommended:  If you are 27-40 years old, get at least 1,000 mg of calcium and at least 600 mg of vitamin D per day.  If you are older than age 59 but younger than age 80, get at least 1,200 mg of calcium and at least 600 mg of vitamin D per day.  If you are older than age 10, get at least 1,200 mg of calcium and at least 800 mg of vitamin D per day.  Smoking and excessive alcohol intake increase the risk of osteoporosis. Eat foods that are rich in calcium and vitamin D, and do weight-bearing exercises several times each week as directed by your health care provider. What should I know about how menopause affects my mental health? Depression may occur at any age, but it is more common as you become older. Common symptoms of depression include:  Low or sad mood.  Changes in sleep patterns.  Changes in appetite or eating patterns.  Feeling an overall lack of motivation or enjoyment of activities that you previously enjoyed.  Frequent crying spells.  Talk with your health care provider if you think that you are experiencing depression. What should I know about immunizations? It is important that you get and maintain your immunizations. These include:  Tetanus, diphtheria, and pertussis (Tdap) booster vaccine.  Influenza every year before the flu season begins.  Pneumonia vaccine.  Shingles vaccine.  Your health care provider may also recommend other immunizations. This information is not intended to replace advice given to you by your health care provider. Make sure you discuss any questions you have with your health care provider. Document Released: 11/12/2005 Document Revised: 04/09/2016 Document Reviewed: 06/24/2015 Elsevier Interactive Patient Education  2018 [place] in the Home Falls can cause injuries and can affect people from all age groups. There are many simple things that you can do to make your home safe and  to help prevent falls. What can I do on the outside of my home?  Regularly repair the edges of walkways and driveways and fix any  cracks.  Remove high doorway thresholds.  Trim any shrubbery on the main path into your home.  Use bright outdoor lighting.  Clear walkways of debris and clutter, including tools and rocks.  Regularly check that handrails are securely fastened and in good repair. Both sides of any steps should have handrails.  Install guardrails along the edges of any raised decks or porches.  Have leaves, snow, and ice cleared regularly.  Use sand or salt on walkways during winter months.  In the garage, clean up any spills right away, including grease or oil spills. What can I do in the bathroom?  Use night lights.  Install grab bars by the toilet and in the tub and shower. Do not use towel bars as grab bars.  Use non-skid mats or decals on the floor of the tub or shower.  If you need to sit down while you are in the shower, use a plastic, non-slip stool.  Keep the floor dry. Immediately clean up any water that spills on the floor.  Remove soap buildup in the tub or shower on a regular basis.  Attach bath mats securely with double-sided non-slip rug tape.  Remove throw rugs and other tripping hazards from the floor. What can I do in the bedroom?  Use night lights.  Make sure that a bedside light is easy to reach.  Do not use oversized bedding that drapes onto the floor.  Have a firm chair that has side arms to use for getting dressed.  Remove throw rugs and other tripping hazards from the floor. What can I do in the kitchen?  Clean up any spills right away.  Avoid walking on wet floors.  Place frequently used items in easy-to-reach places.  If you need to reach for something above you, use a sturdy step stool that has a grab bar.  Keep electrical cables out of the way.  Do not use floor polish or wax that makes floors slippery. If you have to  use wax, make sure that it is non-skid floor wax.  Remove throw rugs and other tripping hazards from the floor. What can I do in the stairways?  Do not leave any items on the stairs.  Make sure that there are handrails on both sides of the stairs. Fix handrails that are broken or loose. Make sure that handrails are as long as the stairways.  Check any carpeting to make sure that it is firmly attached to the stairs. Fix any carpet that is loose or worn.  Avoid having throw rugs at the top or bottom of stairways, or secure the rugs with carpet tape to prevent them from moving.  Make sure that you have a light switch at the top of the stairs and the bottom of the stairs. If you do not have them, have them installed. What are some other fall prevention tips?  Wear closed-toe shoes that fit well and support your feet. Wear shoes that have rubber soles or low heels.  When you use a stepladder, make sure that it is completely opened and that the sides are firmly locked. Have someone hold the ladder while you are using it. Do not climb a closed stepladder.  Add color or contrast paint or tape to grab bars and handrails in your home. Place contrasting color strips on the first and last steps.  Use mobility aids as needed, such as canes, walkers, scooters, and crutches.  Turn on lights if it is dark. Replace any light bulbs  that burn out.  Set up furniture so that there are clear paths. Keep the furniture in the same spot.  Fix any uneven floor surfaces.  Choose a carpet design that does not hide the edge of steps of a stairway.  Be aware of any and all pets.  Review your medicines with your healthcare provider. Some medicines can cause dizziness or changes in blood pressure, which increase your risk of falling. Talk with your health care provider about other ways that you can decrease your risk of falls. This may include working with a physical therapist or trainer to improve your strength,  balance, and endurance. This information is not intended to replace advice given to you by your health care provider. Make sure you discuss any questions you have with your health care provider. Document Released: 09/10/2002 Document Revised: 02/17/2016 Document Reviewed: 10/25/2014 Elsevier Interactive Patient Education  [employer].

## 2017-10-14 DIAGNOSIS — M8580 Other specified disorders of bone density and structure, unspecified site: Secondary | ICD-10-CM | POA: Diagnosis not present

## 2017-10-14 DIAGNOSIS — Z1231 Encounter for screening mammogram for malignant neoplasm of breast: Secondary | ICD-10-CM | POA: Diagnosis not present

## 2017-10-19 DIAGNOSIS — I8312 Varicose veins of left lower extremity with inflammation: Secondary | ICD-10-CM | POA: Diagnosis not present

## 2017-10-19 DIAGNOSIS — I83892 Varicose veins of left lower extremities with other complications: Secondary | ICD-10-CM | POA: Diagnosis not present

## 2017-10-21 DIAGNOSIS — I8312 Varicose veins of left lower extremity with inflammation: Secondary | ICD-10-CM | POA: Diagnosis not present

## 2017-10-31 DIAGNOSIS — M5136 Other intervertebral disc degeneration, lumbar region: Secondary | ICD-10-CM | POA: Diagnosis not present

## 2017-11-08 DIAGNOSIS — M5136 Other intervertebral disc degeneration, lumbar region: Secondary | ICD-10-CM | POA: Diagnosis not present

## 2017-11-23 DIAGNOSIS — M5416 Radiculopathy, lumbar region: Secondary | ICD-10-CM | POA: Diagnosis not present

## 2017-11-24 DIAGNOSIS — I8312 Varicose veins of left lower extremity with inflammation: Secondary | ICD-10-CM | POA: Diagnosis not present

## 2017-11-24 DIAGNOSIS — I83812 Varicose veins of left lower extremities with pain: Secondary | ICD-10-CM | POA: Diagnosis not present

## 2017-11-24 DIAGNOSIS — I83892 Varicose veins of left lower extremities with other complications: Secondary | ICD-10-CM | POA: Diagnosis not present

## 2017-12-01 DIAGNOSIS — M5136 Other intervertebral disc degeneration, lumbar region: Secondary | ICD-10-CM | POA: Diagnosis not present

## 2017-12-08 DIAGNOSIS — I83892 Varicose veins of left lower extremities with other complications: Secondary | ICD-10-CM | POA: Diagnosis not present

## 2017-12-08 DIAGNOSIS — I8312 Varicose veins of left lower extremity with inflammation: Secondary | ICD-10-CM | POA: Diagnosis not present

## 2017-12-22 DIAGNOSIS — I8312 Varicose veins of left lower extremity with inflammation: Secondary | ICD-10-CM | POA: Diagnosis not present

## 2018-01-03 DIAGNOSIS — H40013 Open angle with borderline findings, low risk, bilateral: Secondary | ICD-10-CM | POA: Diagnosis not present

## 2018-01-11 DIAGNOSIS — I8312 Varicose veins of left lower extremity with inflammation: Secondary | ICD-10-CM | POA: Diagnosis not present

## 2018-01-11 DIAGNOSIS — I83892 Varicose veins of left lower extremities with other complications: Secondary | ICD-10-CM | POA: Diagnosis not present

## 2018-01-13 DIAGNOSIS — I8312 Varicose veins of left lower extremity with inflammation: Secondary | ICD-10-CM | POA: Diagnosis not present

## 2018-02-20 DIAGNOSIS — I8312 Varicose veins of left lower extremity with inflammation: Secondary | ICD-10-CM | POA: Diagnosis not present

## 2018-02-20 DIAGNOSIS — I83892 Varicose veins of left lower extremities with other complications: Secondary | ICD-10-CM | POA: Diagnosis not present

## 2018-05-23 DIAGNOSIS — I8311 Varicose veins of right lower extremity with inflammation: Secondary | ICD-10-CM | POA: Diagnosis not present

## 2018-05-23 DIAGNOSIS — I8312 Varicose veins of left lower extremity with inflammation: Secondary | ICD-10-CM | POA: Diagnosis not present

## 2018-05-23 DIAGNOSIS — I890 Lymphedema, not elsewhere classified: Secondary | ICD-10-CM | POA: Diagnosis not present

## 2018-05-23 DIAGNOSIS — I83893 Varicose veins of bilateral lower extremities with other complications: Secondary | ICD-10-CM | POA: Diagnosis not present

## 2018-06-28 DIAGNOSIS — I8311 Varicose veins of right lower extremity with inflammation: Secondary | ICD-10-CM | POA: Diagnosis not present

## 2018-06-28 DIAGNOSIS — I8312 Varicose veins of left lower extremity with inflammation: Secondary | ICD-10-CM | POA: Diagnosis not present

## 2018-07-04 DIAGNOSIS — I8312 Varicose veins of left lower extremity with inflammation: Secondary | ICD-10-CM | POA: Diagnosis not present

## 2018-07-04 DIAGNOSIS — I83893 Varicose veins of bilateral lower extremities with other complications: Secondary | ICD-10-CM | POA: Diagnosis not present

## 2018-07-04 DIAGNOSIS — I890 Lymphedema, not elsewhere classified: Secondary | ICD-10-CM | POA: Diagnosis not present

## 2018-07-04 DIAGNOSIS — I8311 Varicose veins of right lower extremity with inflammation: Secondary | ICD-10-CM | POA: Diagnosis not present

## 2018-07-05 DIAGNOSIS — H40013 Open angle with borderline findings, low risk, bilateral: Secondary | ICD-10-CM | POA: Diagnosis not present

## 2018-07-05 DIAGNOSIS — H3500 Unspecified background retinopathy: Secondary | ICD-10-CM | POA: Diagnosis not present

## 2018-07-05 DIAGNOSIS — H0100A Unspecified blepharitis right eye, upper and lower eyelids: Secondary | ICD-10-CM | POA: Diagnosis not present

## 2018-07-05 DIAGNOSIS — H26493 Other secondary cataract, bilateral: Secondary | ICD-10-CM | POA: Diagnosis not present

## 2018-07-20 DIAGNOSIS — I890 Lymphedema, not elsewhere classified: Secondary | ICD-10-CM | POA: Diagnosis not present

## 2018-09-12 DIAGNOSIS — I83893 Varicose veins of bilateral lower extremities with other complications: Secondary | ICD-10-CM | POA: Diagnosis not present

## 2018-09-12 DIAGNOSIS — I890 Lymphedema, not elsewhere classified: Secondary | ICD-10-CM | POA: Diagnosis not present

## 2018-09-20 ENCOUNTER — Other Ambulatory Visit: Payer: Self-pay | Admitting: Family Medicine

## 2018-10-13 ENCOUNTER — Ambulatory Visit: Payer: Medicare Other

## 2018-10-24 ENCOUNTER — Ambulatory Visit (INDEPENDENT_AMBULATORY_CARE_PROVIDER_SITE_OTHER): Payer: Medicare Other | Admitting: Family Medicine

## 2018-10-24 ENCOUNTER — Encounter: Payer: Self-pay | Admitting: Family Medicine

## 2018-10-24 VITALS — BP 124/70 | HR 80 | Temp 97.7°F | Ht 64.5 in | Wt 239.1 lb

## 2018-10-24 DIAGNOSIS — M150 Primary generalized (osteo)arthritis: Secondary | ICD-10-CM | POA: Diagnosis not present

## 2018-10-24 DIAGNOSIS — M5431 Sciatica, right side: Secondary | ICD-10-CM

## 2018-10-24 DIAGNOSIS — D649 Anemia, unspecified: Secondary | ICD-10-CM | POA: Diagnosis not present

## 2018-10-24 DIAGNOSIS — N390 Urinary tract infection, site not specified: Secondary | ICD-10-CM | POA: Diagnosis not present

## 2018-10-24 DIAGNOSIS — R609 Edema, unspecified: Secondary | ICD-10-CM

## 2018-10-24 DIAGNOSIS — K219 Gastro-esophageal reflux disease without esophagitis: Secondary | ICD-10-CM

## 2018-10-24 DIAGNOSIS — M159 Polyosteoarthritis, unspecified: Secondary | ICD-10-CM

## 2018-10-24 LAB — BASIC METABOLIC PANEL
BUN: 18 mg/dL (ref 6–23)
CO2: 33 mEq/L — ABNORMAL HIGH (ref 19–32)
CREATININE: 0.86 mg/dL (ref 0.40–1.20)
Calcium: 10.3 mg/dL (ref 8.4–10.5)
Chloride: 100 mEq/L (ref 96–112)
GFR: 63.64 mL/min (ref >60.00)
Glucose, Bld: 91 mg/dL (ref 70–99)
Potassium: 4.4 mEq/L (ref 3.5–5.1)
Sodium: 139 mEq/L (ref 135–145)

## 2018-10-24 LAB — HEPATIC FUNCTION PANEL
ALT: 11 U/L (ref 0–35)
AST: 18 U/L (ref 0–37)
Albumin: 4.3 g/dL (ref 3.5–5.2)
Alkaline Phosphatase: 79 U/L (ref 39–117)
BILIRUBIN DIRECT: 0.2 mg/dL (ref 0.0–0.3)
BILIRUBIN TOTAL: 0.8 mg/dL (ref 0.2–1.2)
Total Protein: 7.6 g/dL (ref 6.0–8.3)

## 2018-10-24 LAB — CBC WITH DIFFERENTIAL/PLATELET
BASOS PCT: 0.7 % (ref 0.0–3.0)
Basophils Absolute: 0.1 10*3/uL (ref 0.0–0.1)
EOS PCT: 2.1 % (ref 0.0–5.0)
Eosinophils Absolute: 0.2 10*3/uL (ref 0.0–0.7)
HEMATOCRIT: 39.9 % (ref 36.0–46.0)
HEMOGLOBIN: 13 g/dL (ref 12.0–15.0)
LYMPHS PCT: 25.6 % (ref 12.0–46.0)
Lymphs Abs: 2.1 10*3/uL (ref 0.7–4.0)
MCHC: 32.7 g/dL (ref 30.0–36.0)
MCV: 89.3 fl (ref 78.0–100.0)
Monocytes Absolute: 0.6 10*3/uL (ref 0.1–1.0)
Monocytes Relative: 7.5 % (ref 3.0–12.0)
Neutro Abs: 5.3 10*3/uL (ref 1.4–7.7)
Neutrophils Relative %: 64.1 % (ref 43.0–77.0)
Platelets: 308 10*3/uL (ref 150.0–400.0)
RBC: 4.47 Mil/uL (ref 3.87–5.11)
RDW: 14.6 % (ref 11.5–15.5)
WBC: 8.3 10*3/uL (ref 4.0–10.5)

## 2018-10-24 LAB — LIPID PANEL
CHOL/HDL RATIO: 3
Cholesterol: 186 mg/dL (ref 0–200)
HDL: 63.7 mg/dL (ref >39.00)
LDL Cholesterol: 104 mg/dL — ABNORMAL HIGH (ref 0–99)
NONHDL: 122.43
Triglycerides: 90 mg/dL (ref 0.0–149.0)
VLDL: 18 mg/dL (ref 0.0–40.0)

## 2018-10-24 LAB — FOLATE: FOLATE: 12.4 ng/mL (ref >5.9)

## 2018-10-24 LAB — TSH: TSH: 2.59 u[IU]/mL (ref 0.35–4.50)

## 2018-10-24 LAB — IRON: Iron: 86 ug/dL (ref 42–145)

## 2018-10-24 LAB — FERRITIN: Ferritin: 19.6 ng/mL (ref 10.0–291.0)

## 2018-10-24 MED ORDER — OMEPRAZOLE 40 MG PO CPDR: DELAYED_RELEASE_CAPSULE | ORAL | 3 refills | Status: DC

## 2018-10-24 MED ORDER — FUROSEMIDE 20 MG PO TABS: ORAL_TABLET | ORAL | 3 refills | Status: DC

## 2018-10-24 NOTE — Progress Notes (Signed)
Subjective:    Patient ID: Crystal Little, female    DOB: 06/29/1941, 78 y.o.   MRN: [id number]  HPI Here to follow up on issues. She feels well in general . Her BP is stable. Her right sided sciatica has greatly improved since she got 2 epidural steroid injections last year. Her GERD is stable. She does yoga 3 days a week. She still sees the [facility] for varicose veins and she wears compression stockings every day, but she still has some ankle swelling. She as been taking lasix 20 mg daily but she asks if she could ever take a higher dosage. No SOB.    Review of Systems  Constitutional: Negative.   HENT: Negative.   Eyes: Negative.   Respiratory: Negative.   Cardiovascular: Positive for leg swelling. Negative for chest pain and palpitations.  Gastrointestinal: Negative.   Genitourinary: Negative for decreased urine volume, difficulty urinating, dyspareunia, dysuria, enuresis, flank pain, frequency, hematuria, pelvic pain and urgency.  Musculoskeletal: Positive for back pain.  Skin: Negative.   Neurological: Negative.   Psychiatric/Behavioral: Negative.        Objective:   Physical Exam Constitutional:      General: She is not in acute distress.    Appearance: Normal appearance. She is well-developed.  HENT:     Head: Normocephalic and atraumatic.     Right Ear: External ear normal.     Left Ear: External ear normal.     Nose: Nose normal.     Mouth/Throat:     Pharynx: No oropharyngeal exudate.  Eyes:     General: No scleral icterus.    Conjunctiva/sclera: Conjunctivae normal.     Pupils: Pupils are equal, round, and reactive to light.  Neck:     Musculoskeletal: Normal range of motion and neck supple.     Thyroid: No thyromegaly.     Vascular: No JVD.  Cardiovascular:     Rate and Rhythm: Normal rate and regular rhythm.     Heart sounds: Normal heart sounds. No murmur. No friction rub. No gallop.   Pulmonary:     Effort: Pulmonary effort is normal.  No respiratory distress.     Breath sounds: Normal breath sounds. No wheezing or rales.  Chest:     Chest wall: No tenderness.  Abdominal:     General: Bowel sounds are normal. There is no distension.     Palpations: Abdomen is soft. There is no mass.     Tenderness: There is no abdominal tenderness. There is no guarding or rebound.  Musculoskeletal: Normal range of motion.        General: No tenderness.     Comments: 1+ edema in both ankles   Lymphadenopathy:     Cervical: No cervical adenopathy.  Skin:    General: Skin is warm and dry.     Findings: No erythema or rash.  Neurological:     Mental Status: She is alert and oriented to person, place, and time.     Cranial Nerves: No cranial nerve deficit.     Motor: No abnormal muscle tone.     Coordination: Coordination normal.     Deep Tendon Reflexes: Reflexes are normal and symmetric. Reflexes normal.  Psychiatric:        Behavior: Behavior normal.        Thought Content: Thought content normal.        Judgment: Judgment normal.           Assessment & Plan:  She is doing well in general. For the ankle edema, we will allow her to take 1 or 2 tablets of Lasix (20 mg or 40 mg) daily as needed. Her GERD is well controlled. Her sciatica is stable. We will get fasting labs for lipids, etc. She also had some mild anemia last year with a Hgb of 11.6, so we will check levels for iron, folate, and B12.  Alysia Penna, MD

## 2018-10-25 DIAGNOSIS — N390 Urinary tract infection, site not specified: Secondary | ICD-10-CM | POA: Diagnosis not present

## 2018-10-25 LAB — POC URINALSYSI DIPSTICK (AUTOMATED)
Bilirubin, UA: NEGATIVE
Glucose, UA: NEGATIVE
Ketones, UA: NEGATIVE
NITRITE UA: NEGATIVE
PROTEIN UA: NEGATIVE
RBC UA: NEGATIVE
SPEC GRAV UA: 1.02 (ref 1.010–1.025)
UROBILINOGEN UA: 0.2 U/dL
pH, UA: 6 (ref 5.0–8.0)

## 2018-10-25 NOTE — Addendum Note (Signed)
Addended by: Elmer Picker on: 10/25/2018 02:53 PM   Modules accepted: Orders

## 2018-10-26 LAB — URINE CULTURE
MICRO NUMBER: 89310
SPECIMEN QUALITY: ADEQUATE

## 2018-10-26 LAB — VITAMIN B12: Vitamin B-12: 283 pg/mL (ref 211–911)

## 2018-10-27 ENCOUNTER — Encounter: Payer: Self-pay | Admitting: *Deleted

## 2018-11-09 ENCOUNTER — Other Ambulatory Visit: Payer: Self-pay | Admitting: Family Medicine

## 2019-02-08 IMAGING — MR MR LUMBAR SPINE W/O CM
4 of 5 series · 26 of 48 positions shown · non-contrast
Comparison: None.

CLINICAL DATA: Acute right-sided back pain with sciatica

EXAM:
MRI LUMBAR SPINE WITHOUT CONTRAST
TECHNIQUE: Multiplanar, multisequence MR imaging of the lumbar spine was
performed. No intravenous contrast was administered.

[Series 5: T1 · sagittal · 4.0mm · 0.55mm/px · 6 of 14 slices shown (1 of 2)]
[im 1/14]
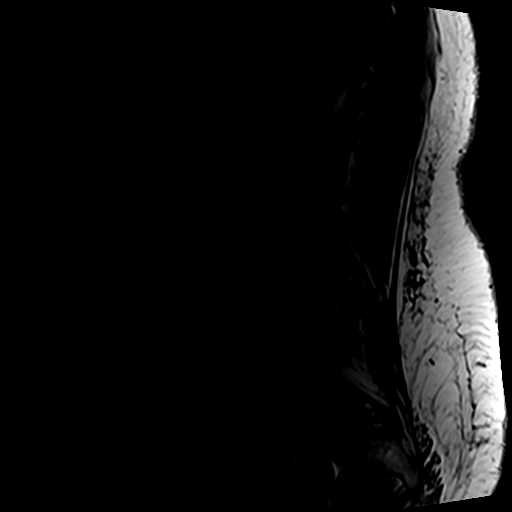
[im 3/14]
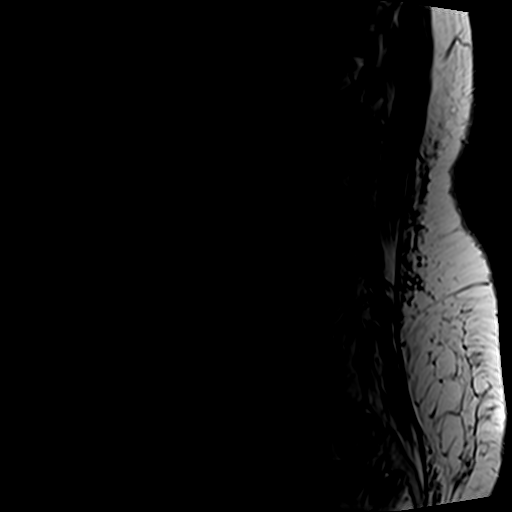
[im 6/14]
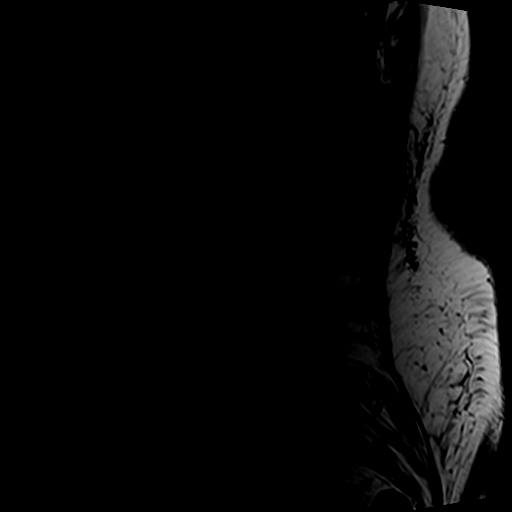
[im 8/14]
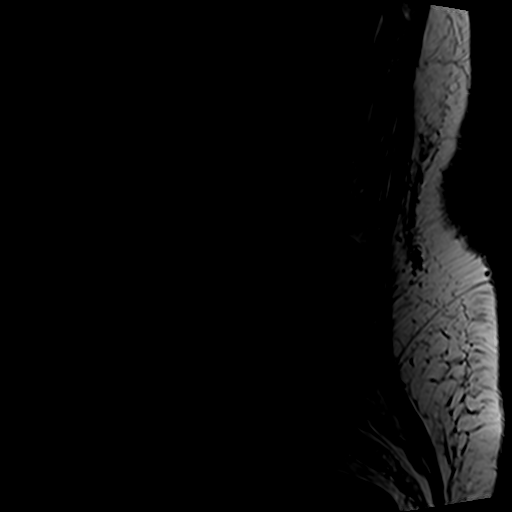
[im 11/14]
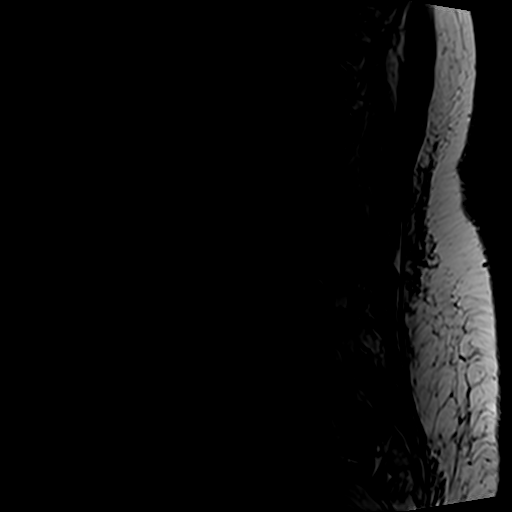
[im 14/14]
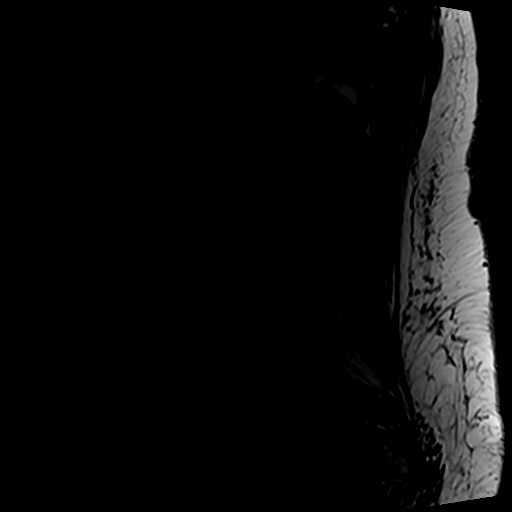

[Series 6: T1 · axial · 4.0mm · 0.35mm/px · z∈[-107,+68]mm · 5 of 36 slices shown (2 of 2)]
[im 1/36]
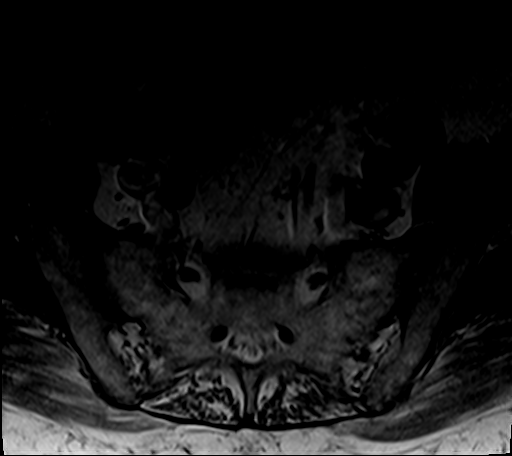
[im 6/36]
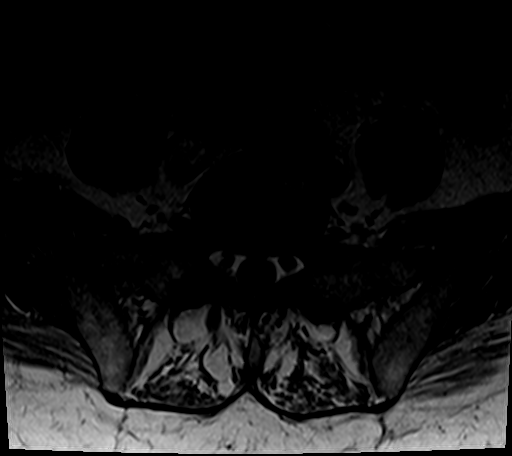
[im 11/36]
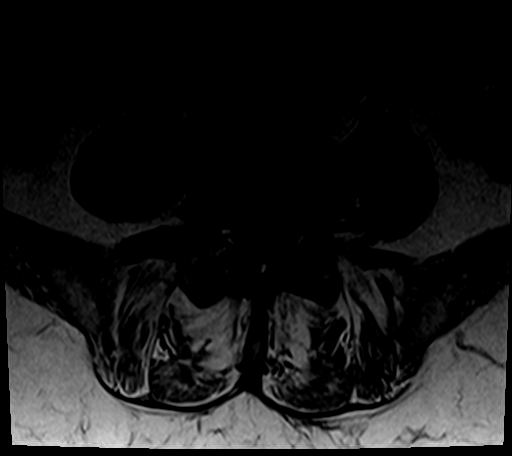
[im 18/36]
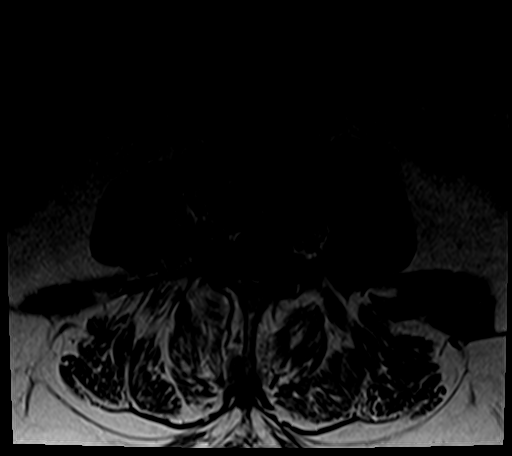
[im 31/36]
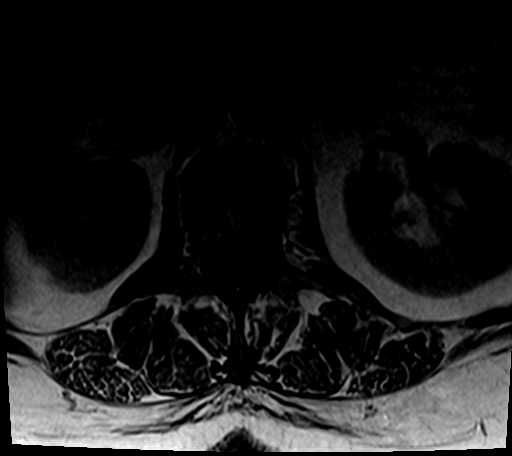

[Series 7: T2 · axial · 4.0mm · 0.70mm/px · z∈[-107,+94]mm · 9 of 36 slices shown]
[im 1/36]
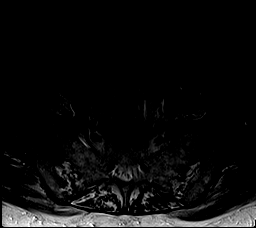
[im 6/36]
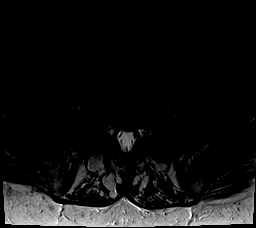
[im 11/36]
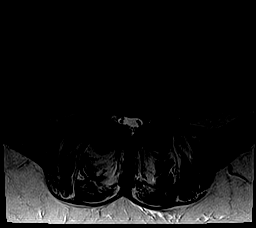
[im 16/36]
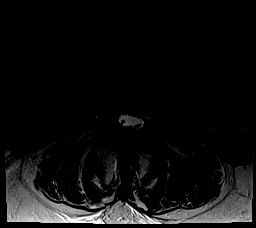
[im 18/36]
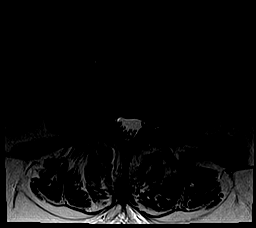
[im 21/36]
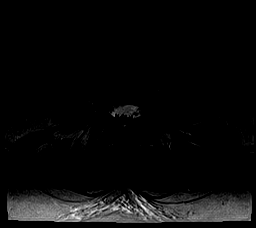
[im 26/36]
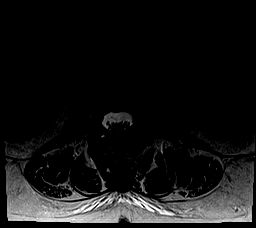
[im 31/36]
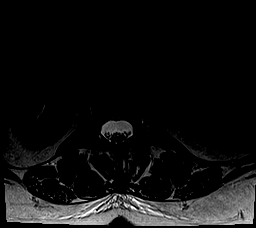
[im 36/36]
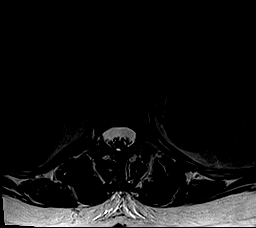

[Series 8: T2 post-contrast · sagittal · 4.0mm · 0.55mm/px · 6 of 14 slices shown]
[im 1/14]
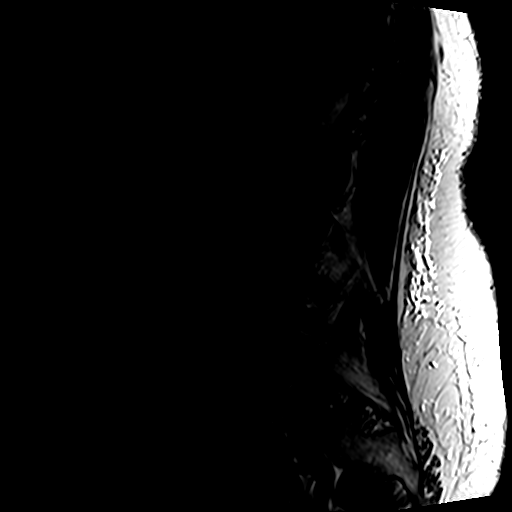
[im 3/14]
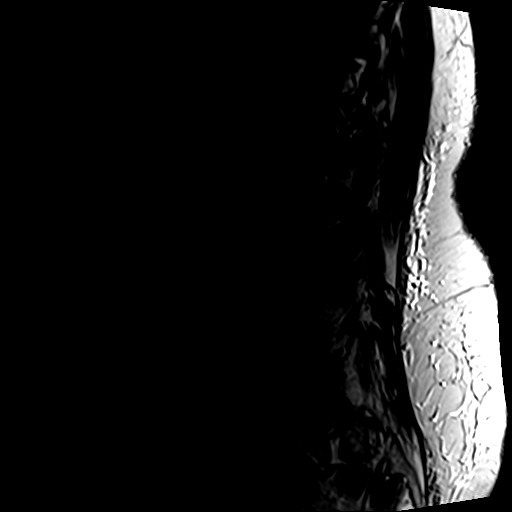
[im 6/14]
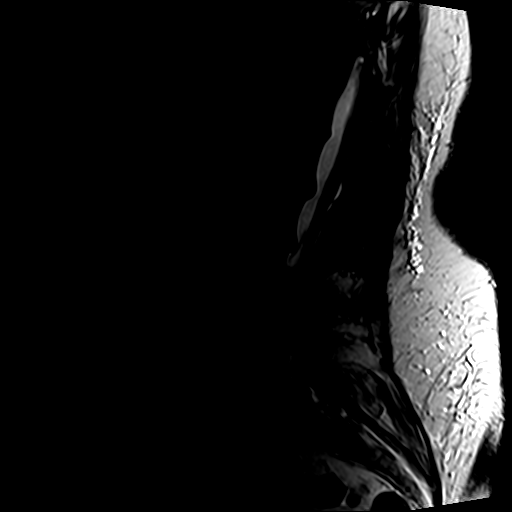
[im 8/14]
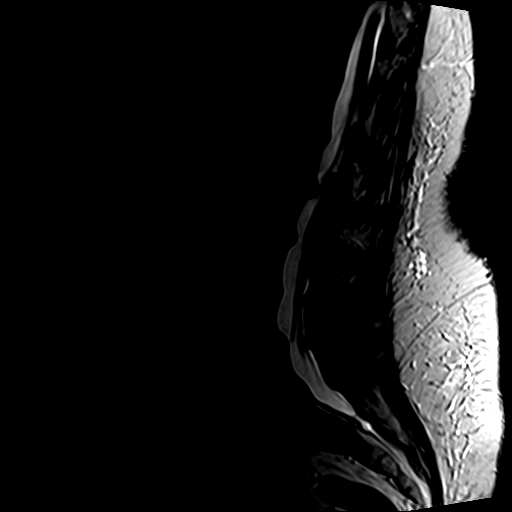
[im 11/14]
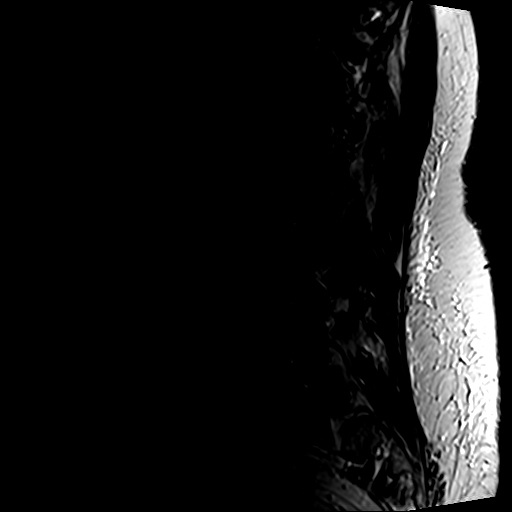
[im 14/14]
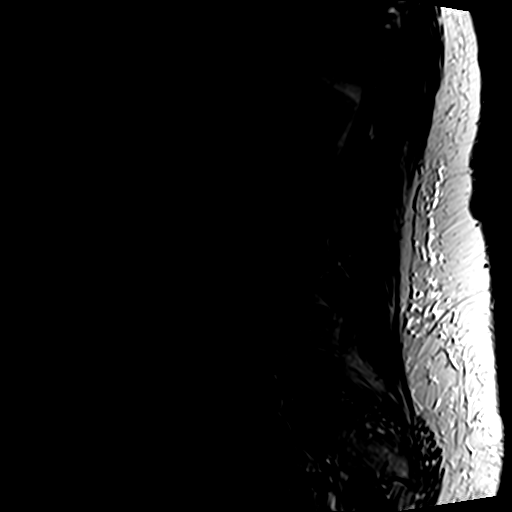

[26 of 48 positions shown; findings below may reference images not displayed]

FINDINGS: Segmentation:  Normal

Alignment: Mild retrolisthesis L1-2. Mild anterolisthesis L4-5 on
the right. Mild lumbar scoliosis.

Vertebrae: Negative for fracture or mass. Discogenic edema on the
left at L1-2.

Conus medullaris: Extends to the T12-L1 level and appears normal.

Paraspinal and other soft tissues: Negative

Disc levels:

T12-L1:  Negative

L1-2: Disc degeneration and spurring on the left causing left
foraminal narrowing. Spinal canal adequate in size.

L2-3: Disc degeneration with diffuse disc bulging and mild facet
degeneration. Mild narrowing of the canal.

L3-4: Mild disc degeneration and disc bulging. Mild facet
degeneration. No significant spinal stenosis.

L4-5: Mild anterolisthesis on the right. Disc and facet degeneration
with compression of thecal sac and mild spinal stenosis. Neural
foramina adequately patent

L5-S1:  Mild disc and facet degeneration without stenosis
IMPRESSION: Mild lumbar scoliosis.

Disc degeneration and spurring on the left L1-2 causing left
foraminal narrowing

Mild disc and facet degeneration L2-3 and L3-4 without significant
stenosis

Mild spinal stenosis L4-5 due to disc degeneration and facet
degeneration.

## 2019-03-01 DIAGNOSIS — I890 Lymphedema, not elsewhere classified: Secondary | ICD-10-CM | POA: Diagnosis not present

## 2019-03-01 DIAGNOSIS — M79605 Pain in left leg: Secondary | ICD-10-CM | POA: Diagnosis not present

## 2019-03-15 DIAGNOSIS — I8311 Varicose veins of right lower extremity with inflammation: Secondary | ICD-10-CM | POA: Diagnosis not present

## 2019-03-15 DIAGNOSIS — I8312 Varicose veins of left lower extremity with inflammation: Secondary | ICD-10-CM | POA: Diagnosis not present

## 2019-03-15 DIAGNOSIS — I890 Lymphedema, not elsewhere classified: Secondary | ICD-10-CM | POA: Diagnosis not present

## 2019-04-03 DIAGNOSIS — Z1231 Encounter for screening mammogram for malignant neoplasm of breast: Secondary | ICD-10-CM | POA: Diagnosis not present

## 2019-04-03 DIAGNOSIS — Z124 Encounter for screening for malignant neoplasm of cervix: Secondary | ICD-10-CM | POA: Diagnosis not present

## 2019-04-11 DIAGNOSIS — H40013 Open angle with borderline findings, low risk, bilateral: Secondary | ICD-10-CM | POA: Diagnosis not present

## 2019-07-25 ENCOUNTER — Telehealth: Payer: Self-pay | Admitting: Family Medicine

## 2019-07-25 NOTE — Telephone Encounter (Signed)
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  Pt aware Dr Sarajane Jews is out this week and may return next week.  [id number] [address], [place] (Phone) [phone] (Fax)   Pt declined to make appt, she says she does not need appt

## 2019-07-26 NOTE — Telephone Encounter (Signed)
Ok to send in rx

## 2019-07-26 NOTE — Telephone Encounter (Signed)
lamisil is a medication that can affect the liver heavily and we monitor liver function tests carefully while on this. I would suggest appointment with Dr. Sarajane Jews as this is a medication I would not prescribe without a visit and discussion due to side effects. If she still does not want appointment, then message can be forwarded to Dr. Sarajane Jews for when he returns, but I would suggest a visit for further discussion.

## 2019-07-27 NOTE — Telephone Encounter (Signed)
Unable to call pt. Leaving a Mychart message updating pt of Dr. Berenice Bouton response.

## 2019-07-31 MED ORDER — TERBINAFINE HCL 250 MG PO TABS: 250.0000 mg | ORAL_TABLET | Freq: Every day | ORAL | 0 refills | Status: DC

## 2019-07-31 NOTE — Telephone Encounter (Signed)
Rx sent to pharmacy   

## 2019-07-31 NOTE — Telephone Encounter (Signed)
Ok for refill? 

## 2019-07-31 NOTE — Telephone Encounter (Signed)
Call in Terbinafine 250 mg tablets, once daily, #90 with no rf

## 2019-09-04 ENCOUNTER — Other Ambulatory Visit: Payer: Self-pay

## 2019-09-05 ENCOUNTER — Ambulatory Visit (INDEPENDENT_AMBULATORY_CARE_PROVIDER_SITE_OTHER): Payer: Medicare Other | Admitting: Family Medicine

## 2019-09-05 ENCOUNTER — Encounter: Payer: Self-pay | Admitting: Family Medicine

## 2019-09-05 VITALS — BP 140/62 | HR 80 | Temp 98.6°F | Ht 64.5 in | Wt 237.0 lb

## 2019-09-05 DIAGNOSIS — R600 Localized edema: Secondary | ICD-10-CM

## 2019-09-05 LAB — HEPATIC FUNCTION PANEL
ALT: 15 U/L (ref 0–35)
AST: 19 U/L (ref 0–37)
Albumin: 4 g/dL (ref 3.5–5.2)
Alkaline Phosphatase: 73 U/L (ref 39–117)
Bilirubin, Direct: 0.1 mg/dL (ref 0.0–0.3)
Total Bilirubin: 0.6 mg/dL (ref 0.2–1.2)
Total Protein: 7 g/dL (ref 6.0–8.3)

## 2019-09-05 LAB — CBC WITH DIFFERENTIAL/PLATELET
Basophils Absolute: 0.1 10*3/uL (ref 0.0–0.1)
Basophils Relative: 0.7 % (ref 0.0–3.0)
Eosinophils Absolute: 0.2 10*3/uL (ref 0.0–0.7)
Eosinophils Relative: 2.5 % (ref 0.0–5.0)
HCT: 35.9 % — ABNORMAL LOW (ref 36.0–46.0)
Hemoglobin: 11.8 g/dL — ABNORMAL LOW (ref 12.0–15.0)
Lymphocytes Relative: 25.3 % (ref 12.0–46.0)
Lymphs Abs: 2.4 10*3/uL (ref 0.7–4.0)
MCHC: 32.9 g/dL (ref 30.0–36.0)
MCV: 90.6 fl (ref 78.0–100.0)
Monocytes Absolute: 0.8 10*3/uL (ref 0.1–1.0)
Monocytes Relative: 8.8 % (ref 3.0–12.0)
Neutro Abs: 5.9 10*3/uL (ref 1.4–7.7)
Neutrophils Relative %: 62.7 % (ref 43.0–77.0)
Platelets: 260 10*3/uL (ref 150.0–400.0)
RBC: 3.97 Mil/uL (ref 3.87–5.11)
RDW: 15.1 % (ref 11.5–15.5)
WBC: 9.5 10*3/uL (ref 4.0–10.5)

## 2019-09-05 LAB — BASIC METABOLIC PANEL
BUN: 20 mg/dL (ref 6–23)
CO2: 32 mEq/L (ref 19–32)
Calcium: 9.4 mg/dL (ref 8.4–10.5)
Chloride: 103 mEq/L (ref 96–112)
Creatinine, Ser: 0.83 mg/dL (ref 0.40–1.20)
GFR: 66.15 mL/min (ref >60.00)
Glucose, Bld: 95 mg/dL (ref 70–99)
Potassium: 4.7 mEq/L (ref 3.5–5.1)
Sodium: 141 mEq/L (ref 135–145)

## 2019-09-05 LAB — BRAIN NATRIURETIC PEPTIDE: Pro B Natriuretic peptide (BNP): 31 pg/mL (ref 0.0–100.0)

## 2019-09-05 LAB — TSH: TSH: 2.94 u[IU]/mL (ref 0.35–4.50)

## 2019-09-05 MED ORDER — FUROSEMIDE 20 MG PO TABS: 40.0000 mg | ORAL_TABLET | Freq: Every day | ORAL | 3 refills | Status: DC

## 2019-09-05 NOTE — Patient Instructions (Addendum)
Health Maintenance Due  Topic Date Due  . INFLUENZA VACCINE  05/05/2019    Depression screen [facility] 2/9 10/24/2018 10/12/2017 09/05/2017  Decreased Interest 0 0 0  Down, Depressed, Hopeless 0 0 0  PHQ - 2 Score 0 0 0

## 2019-09-05 NOTE — Progress Notes (Signed)
   Subjective:    Patient ID: Crystal Little, female    DOB: 1941-02-05, 78 y.o.   MRN: [id number]  HPI Here for one month of increased swelling in both lower legs. They are mildly painful at times. They are never red or warm. She has had some swelling for years, but this has increased a little lately. She admits to some mild SOB on exertion, but there is no chest pain. She takes walks every day around her neighborhood, and she finds that she needs to stop and rest a little more frequently than she used to. She has lasix 20 mg at home and she sometimes takes one in the mornings, but not every day.    Review of Systems  Constitutional: Negative.   Respiratory: Negative.   Cardiovascular: Positive for leg swelling. Negative for chest pain and palpitations.  Neurological: Negative.        Objective:   Physical Exam Constitutional:      Appearance: Normal appearance. She is not ill-appearing.  Cardiovascular:     Rate and Rhythm: Normal rate and regular rhythm.     Pulses: Normal pulses.     Heart sounds: Normal heart sounds.  Pulmonary:     Effort: Pulmonary effort is normal. No respiratory distress.     Breath sounds: Normal breath sounds. No stridor. No wheezing, rhonchi or rales.  Musculoskeletal:     Comments: 2+ edema in both lower legs   Neurological:     General: No focal deficit present.     Mental Status: She is alert and oriented to person, place, and time.           Assessment & Plan:  Leg edema. She will take 2 Lasix tablets (40 mg) every morning. Get labs today including a BMET and BNP. Set up an ECHO soon. Recheck in one week.  Alysia Penna, MD

## 2019-09-11 ENCOUNTER — Other Ambulatory Visit: Payer: Self-pay

## 2019-09-11 ENCOUNTER — Ambulatory Visit (HOSPITAL_COMMUNITY): Payer: Medicare Other | Attending: Cardiology

## 2019-09-11 DIAGNOSIS — R600 Localized edema: Secondary | ICD-10-CM

## 2019-10-15 ENCOUNTER — Other Ambulatory Visit: Payer: Self-pay | Admitting: Family Medicine

## 2019-10-17 DIAGNOSIS — H43813 Vitreous degeneration, bilateral: Secondary | ICD-10-CM | POA: Diagnosis not present

## 2019-10-17 DIAGNOSIS — H26493 Other secondary cataract, bilateral: Secondary | ICD-10-CM | POA: Diagnosis not present

## 2019-10-17 DIAGNOSIS — H40013 Open angle with borderline findings, low risk, bilateral: Secondary | ICD-10-CM | POA: Diagnosis not present

## 2019-12-13 ENCOUNTER — Other Ambulatory Visit: Payer: Self-pay | Admitting: Family Medicine

## 2019-12-20 DIAGNOSIS — H26492 Other secondary cataract, left eye: Secondary | ICD-10-CM | POA: Diagnosis not present

## 2019-12-28 ENCOUNTER — Ambulatory Visit: Payer: Medicare Other | Admitting: Family Medicine

## 2019-12-31 ENCOUNTER — Other Ambulatory Visit: Payer: Self-pay

## 2019-12-31 ENCOUNTER — Ambulatory Visit (INDEPENDENT_AMBULATORY_CARE_PROVIDER_SITE_OTHER): Payer: Medicare Other | Admitting: Family Medicine

## 2019-12-31 ENCOUNTER — Encounter: Payer: Self-pay | Admitting: Family Medicine

## 2019-12-31 VITALS — BP 130/62 | HR 83 | Temp 98.0°F | Ht 65.5 in | Wt 239.4 lb

## 2019-12-31 DIAGNOSIS — Z23 Encounter for immunization: Secondary | ICD-10-CM | POA: Diagnosis not present

## 2019-12-31 DIAGNOSIS — K219 Gastro-esophageal reflux disease without esophagitis: Secondary | ICD-10-CM | POA: Diagnosis not present

## 2019-12-31 DIAGNOSIS — M159 Polyosteoarthritis, unspecified: Secondary | ICD-10-CM

## 2019-12-31 DIAGNOSIS — M8949 Other hypertrophic osteoarthropathy, multiple sites: Secondary | ICD-10-CM | POA: Diagnosis not present

## 2019-12-31 DIAGNOSIS — R609 Edema, unspecified: Secondary | ICD-10-CM

## 2019-12-31 DIAGNOSIS — E785 Hyperlipidemia, unspecified: Secondary | ICD-10-CM

## 2019-12-31 DIAGNOSIS — B351 Tinea unguium: Secondary | ICD-10-CM

## 2019-12-31 LAB — CBC WITH DIFFERENTIAL/PLATELET
Basophils Absolute: 0.1 10*3/uL (ref 0.0–0.1)
Basophils Relative: 1 % (ref 0.0–3.0)
Eosinophils Absolute: 0.2 10*3/uL (ref 0.0–0.7)
Eosinophils Relative: 1.8 % (ref 0.0–5.0)
HCT: 38 % (ref 36.0–46.0)
Hemoglobin: 12.4 g/dL (ref 12.0–15.0)
Lymphocytes Relative: 26.4 % (ref 12.0–46.0)
Lymphs Abs: 2.2 10*3/uL (ref 0.7–4.0)
MCHC: 32.6 g/dL (ref 30.0–36.0)
MCV: 91.7 fl (ref 78.0–100.0)
Monocytes Absolute: 0.7 10*3/uL (ref 0.1–1.0)
Monocytes Relative: 8.9 % (ref 3.0–12.0)
Neutro Abs: 5.1 10*3/uL (ref 1.4–7.7)
Neutrophils Relative %: 61.9 % (ref 43.0–77.0)
Platelets: 276 10*3/uL (ref 150.0–400.0)
RBC: 4.14 Mil/uL (ref 3.87–5.11)
RDW: 14.9 % (ref 11.5–15.5)
WBC: 8.2 10*3/uL (ref 4.0–10.5)

## 2019-12-31 LAB — BASIC METABOLIC PANEL
BUN: 19 mg/dL (ref 6–23)
CO2: 30 mEq/L (ref 19–32)
Calcium: 9.8 mg/dL (ref 8.4–10.5)
Chloride: 103 mEq/L (ref 96–112)
Creatinine, Ser: 0.89 mg/dL (ref 0.40–1.20)
GFR: 60.98 mL/min (ref >60.00)
Glucose, Bld: 98 mg/dL (ref 70–99)
Potassium: 4.7 mEq/L (ref 3.5–5.1)
Sodium: 141 mEq/L (ref 135–145)

## 2019-12-31 LAB — LIPID PANEL
Cholesterol: 173 mg/dL (ref 0–200)
HDL: 61.2 mg/dL (ref >39.00)
LDL Cholesterol: 95 mg/dL (ref 0–99)
NonHDL: 112.02
Total CHOL/HDL Ratio: 3
Triglycerides: 84 mg/dL (ref 0.0–149.0)
VLDL: 16.8 mg/dL (ref 0.0–40.0)

## 2019-12-31 LAB — HEPATIC FUNCTION PANEL
ALT: 10 U/L (ref 0–35)
AST: 15 U/L (ref 0–37)
Albumin: 4.2 g/dL (ref 3.5–5.2)
Alkaline Phosphatase: 65 U/L (ref 39–117)
Bilirubin, Direct: 0.1 mg/dL (ref 0.0–0.3)
Total Bilirubin: 0.6 mg/dL (ref 0.2–1.2)
Total Protein: 7.2 g/dL (ref 6.0–8.3)

## 2019-12-31 LAB — TSH: TSH: 3.19 u[IU]/mL (ref 0.35–4.50)

## 2019-12-31 NOTE — Progress Notes (Signed)
Subjective:    Patient ID: Crystal Little, female    DOB: 12/18/1940, 79 y.o.   MRN: [id number]  HPI Here to follow up on issues. She feels well in general. The Lasix does a nice job controlling her leg edema. No SOB. She took a course of Terbinafine for fungus under the right thumbnail, but this has not worked. Her GERD is stable. She gets her mammogram yearly. She has had both Covid vaccines.    Review of Systems  Constitutional: Negative.   HENT: Negative.   Eyes: Negative.   Respiratory: Negative.   Cardiovascular: Negative.   Gastrointestinal: Negative.   Genitourinary: Negative for decreased urine volume, difficulty urinating, dyspareunia, dysuria, enuresis, flank pain, frequency, hematuria, pelvic pain and urgency.  Musculoskeletal: Negative.   Skin: Negative.   Neurological: Negative.   Psychiatric/Behavioral: Negative.        Objective:   Physical Exam Constitutional:      General: She is not in acute distress.    Appearance: She is well-developed.  HENT:     Head: Normocephalic and atraumatic.     Right Ear: External ear normal.     Left Ear: External ear normal.     Nose: Nose normal.     Mouth/Throat:     Pharynx: No oropharyngeal exudate.  Eyes:     General: No scleral icterus.    Conjunctiva/sclera: Conjunctivae normal.     Pupils: Pupils are equal, round, and reactive to light.  Neck:     Thyroid: No thyromegaly.     Vascular: No JVD.  Cardiovascular:     Rate and Rhythm: Normal rate and regular rhythm.     Heart sounds: Normal heart sounds. No murmur. No friction rub. No gallop.   Pulmonary:     Effort: Pulmonary effort is normal. No respiratory distress.     Breath sounds: Normal breath sounds. No wheezing or rales.  Chest:     Chest wall: No tenderness.  Abdominal:     General: Bowel sounds are normal. There is no distension.     Palpations: Abdomen is soft. There is no mass.     Tenderness: There is no abdominal tenderness. There is no  guarding or rebound.  Musculoskeletal:        General: No tenderness. Normal range of motion.     Cervical back: Normal range of motion and neck supple.     Comments: Trace edema in both ankles   Lymphadenopathy:     Cervical: No cervical adenopathy.  Skin:    General: Skin is warm and dry.     Findings: No erythema or rash.     Comments: The right thumbnail has crusting underneath   Neurological:     Mental Status: She is alert and oriented to person, place, and time.     Cranial Nerves: No cranial nerve deficit.     Motor: No abnormal muscle tone.     Coordination: Coordination normal.     Deep Tendon Reflexes: Reflexes are normal and symmetric. Reflexes normal.  Psychiatric:        Behavior: Behavior normal.        Thought Content: Thought content normal.        Judgment: Judgment normal.           Assessment & Plan:  Her GERD and leg edema are stable. We will get fasting labs to check lipids, etc. Given a Td shot. Refer to Dermatology for the nail fungus. Alysia Penna, MD

## 2019-12-31 NOTE — Addendum Note (Signed)
Addended by: Rebecca Eaton on: 12/31/2019 11:13 AM   Modules accepted: Orders

## 2020-01-10 DIAGNOSIS — L609 Nail disorder, unspecified: Secondary | ICD-10-CM | POA: Diagnosis not present

## 2020-01-10 DIAGNOSIS — D485 Neoplasm of uncertain behavior of skin: Secondary | ICD-10-CM | POA: Diagnosis not present

## 2020-04-04 DIAGNOSIS — D225 Melanocytic nevi of trunk: Secondary | ICD-10-CM | POA: Diagnosis not present

## 2020-04-04 DIAGNOSIS — L814 Other melanin hyperpigmentation: Secondary | ICD-10-CM | POA: Diagnosis not present

## 2020-04-04 DIAGNOSIS — B351 Tinea unguium: Secondary | ICD-10-CM | POA: Diagnosis not present

## 2020-04-04 DIAGNOSIS — L821 Other seborrheic keratosis: Secondary | ICD-10-CM | POA: Diagnosis not present

## 2020-04-21 ENCOUNTER — Ambulatory Visit (INDEPENDENT_AMBULATORY_CARE_PROVIDER_SITE_OTHER): Payer: Medicare Other

## 2020-04-21 ENCOUNTER — Other Ambulatory Visit: Payer: Self-pay

## 2020-04-21 DIAGNOSIS — Z0000 Encounter for general adult medical examination without abnormal findings: Secondary | ICD-10-CM | POA: Diagnosis not present

## 2020-04-21 NOTE — Progress Notes (Signed)
Subjective:   Crystal Little is a 79 y.o. female who presents for Medicare Annual (Subsequent) preventive examination.  I connected with Crystal Little today by telephone and verified that I am speaking with the correct person using two identifiers. Location patient: home Location provider: work Persons participating in the virtual visit: patient, provider.   I discussed the limitations, risks, security and privacy concerns of performing an evaluation and management service by telephone and the availability of in person appointments. I also discussed with the patient that there may be a patient responsible charge related to this service. The patient expressed understanding and verbally consented to this telephonic visit.    Interactive audio and video telecommunications were attempted between this provider and patient, however failed, due to patient having technical difficulties OR patient did not have access to video capability.  We continued and completed visit with audio only.      Review of Systems    N/A Cardiac Risk Factors include: advanced age (>63mn, >>69women)     Objective:    Today's Vitals   There is no height or weight on file to calculate BMI.  Advanced Directives 04/21/2020 10/12/2017 07/04/2017 02/18/2015 01/03/2012  Does Patient Have a Medical Advance Directive? Yes Yes Yes Yes Patient has advance directive, copy not in chart  Type of Advance Directive [place] - [place] - -  Copy of [place] Chart? Yes - validated most recent copy scanned in chart (See row information) - No - copy requested No - copy requested -    Current Medications (verified) Outpatient Encounter Medications as of 04/21/2020  Medication Sig  . Ascorbic Acid (VITAMIN C) 100 MG tablet Take 100 mg by mouth daily.  . Calcium-Vitamin D-Vitamin K (VIACTIV CALCIUM PLUS D) 650-12.5-40 MG-MCG-MCG CHEW Viactiv  .  Cholecalciferol (VITAMIN D) 2000 UNITS CAPS Take by mouth.    . furosemide (LASIX) 20 MG tablet TAKE 1 OR 2 TABLETS A DAY AS NEEDED FOR FLUID  . NAFTIN 2 % GEL Apply topically 2 (two) times daily.  .Marland Kitchenomeprazole (PRILOSEC) 40 MG capsule TAKE 1 CAPSULE BY MOUTH EVERY DAY  . Influenza vac split quadrivalent PF (FLUZONE HIGH-DOSE) 0.5 ML injection Fluzone High-Dose 2019-20 (PF) 180 mcg/0.5 mL intramuscular syringe  TO BE ADMINISTERED BY PHARMACIST FOR IMMUNIZATION (Patient not taking: Reported on 04/21/2020)  . terbinafine (LAMISIL) 250 MG tablet Take 1 tablet (250 mg total) by mouth daily. (Patient not taking: Reported on 04/21/2020)  . Zoster Vaccine Adjuvanted ([employer] [facility] [employer] injection Shingrix (PF) 50 mcg/0.5 mL intramuscular suspension, kit  TO BE ADMINISTERED BY PHARMACIST FOR IMMUNIZATION (Patient not taking: Reported on 04/21/2020)   No facility-administered encounter medications on file as of 04/21/2020.    Allergies (verified) Amoxicillin   History: Past Medical History:  Diagnosis Date  . Diverticulosis   . Frequent UTI    sees Dr. DRosana Hoes . GERD (gastroesophageal reflux disease)   . Gynecological examination    sees Dr. DSander Radon . IBS (irritable bowel syndrome)   . Leg edema   . Osteoarthritis   . Status post dilation of esophageal narrowing   . [facility]    [facility]   Past Surgical History:  Procedure Laterality Date  . COLONOSCOPY  04-26-11   per Dr. PSharlett Iles diverticulosis only, no repeats needed  . endovascular repair of bilateral leg varicosities     per Dr. GJones Skene  .  ESOPHAGOGASTRODUODENOSCOPY  01-19-10   per Dr. Sharlett Iles, with dilatation   . ESOPHAGOGASTRODUODENOSCOPY (EGD) WITH ESOPHAGEAL DILATION    . right knee arthroscopy  12/17/05 and 01-06-12   per Dr. Percell Miller  . TONSILLECTOMY     Family History  Problem Relation Age of Onset  . Irritable bowel syndrome Mother   . Pancreatic cancer Paternal Grandmother   . Diabetes  Maternal Aunt   . Diabetes Maternal Uncle   . Heart disease Paternal Grandfather   . Colon cancer Neg Hx   . Colon polyps Neg Hx   . Kidney disease Neg Hx    Social History   Socioeconomic History  . Marital status: Married    Spouse name: Not on file  . Number of children: 3  . Years of education: Not on file  . Highest education level: Not on file  Occupational History  . Occupation: [employer] photographer  Tobacco Use  . Smoking status: Never Smoker  . Smokeless tobacco: Never Used  Substance and Sexual Activity  . Alcohol use: Yes    Alcohol/week: 2.0 standard drinks    Types: 2 Glasses of wine per week    Comment: occ  . Drug use: No  . Sexual activity: Not on file  Other Topics Concern  . Not on file  Social History Narrative  . Not on file   Social Determinants of Health   Financial Resource Strain: Low Risk   . Difficulty of Paying Living Expenses: Not hard at all  Food Insecurity: No Food Insecurity  . Worried About Charity fundraiser in the Last Year: Never true  . Ran Out of Food in the Last Year: Never true  Transportation Needs: No Transportation Needs  . Lack of Transportation (Medical): No  . Lack of Transportation (Non-Medical): No  Physical Activity: Inactive  . Days of Exercise per Week: 0 days  . Minutes of Exercise per Session: 0 min  Stress: No Stress Concern Present  . Feeling of Stress : Not at all  Social Connections: Moderately Isolated  . Frequency of Communication with Friends and Family: More than three times a week  . Frequency of Social Gatherings with Friends and Family: Twice a week  . Attends Religious Services: More than 4 times per year  . Active Member of Clubs or Organizations: No  . Attends Archivist Meetings: Never  . Marital Status: Widowed    Tobacco Counseling Counseling given: Not Answered   Clinical Intake:  Pre-visit preparation completed: Yes  Pain : No/denies pain     Nutritional Risks:  None Diabetes: No  How often do you need to have someone help you when you read instructions, pamphlets, or other written materials from your doctor or pharmacy?: 1 - Never What is the last grade level you completed in school?: 4 years of college  Diabetic?No  Interpreter Needed?: No  Information entered by :: [place] of Daily Living In your present state of health, do you have any difficulty performing the following activities: 04/21/2020  Hearing? Y  Comment Has some difficulty hearing  Difficulty concentrating or making decisions? N  Walking or climbing stairs? N  Dressing or bathing? N  Doing errands, shopping? N  Preparing Food and eating ? N  Using the Toilet? N  In the past six months, have you accidently leaked urine? N  Do you have problems with loss of bowel control? N  Managing your Medications? N  Managing your Finances? N  Housekeeping  or managing your Housekeeping? N  Some recent data might be hidden    Patient Care Team: Laurey Morale, MD as PCP - General  Indicate any recent Medical Services you may have received from other than [facility] providers in the past year (date may be approximate).     Assessment:   This is a routine wellness examination for Crystal Little.  Hearing/Vision screen  Hearing Screening   '125Hz'  '250Hz'  '500Hz'  '1000Hz'  '2000Hz'  '3000Hz'  '4000Hz'  '6000Hz'  '8000Hz'   Right ear:           Left ear:           Vision Screening Comments: See's her eye doctor every 6 months   Dietary issues and exercise activities discussed: Current Exercise Habits: The patient does not participate in regular exercise at present, Exercise limited by: orthopedic condition(s)  Goals    . Patient Stated     Continue to work through your grief  Continue with grief care     . Patient Stated     I will continue to take my medications as prescribed      Depression Screen PHQ 2/9 Scores 04/21/2020 10/24/2018 10/12/2017 09/05/2017 08/27/2015  PHQ - 2 Score 0 0 0 0 0   PHQ- 9 Score 0 - - - -    Fall Risk Fall Risk  04/21/2020 10/24/2018 10/12/2017 09/05/2017 08/27/2015  Falls in the past year? 0 0 No No Yes  Number falls in past yr: 0 - - - 1  Injury with Fall? 0 - - - No  Risk for fall due to : Medication side effect - - - -  Follow up Falls evaluation completed - - - Falls evaluation completed    Any stairs in or around the home? Yes  If so, are there any without handrails? No  Home free of loose throw rugs in walkways, pet beds, electrical cords, etc? Yes  Adequate lighting in your home to reduce risk of falls? Yes   ASSISTIVE DEVICES UTILIZED TO PREVENT FALLS:  Life alert? Yes  Use of a cane, walker or w/c? No  Grab bars in the bathroom? Yes  Shower chair or bench in shower? Yes  Elevated toilet seat or a handicapped toilet? Yes     Cognitive Function: MMSE - Mini Mental State Exam 10/12/2017  Not completed: (No Data)     6CIT Screen 04/21/2020  What Year? 0 points  What month? 0 points  What time? 0 points  Count back from 20 0 points  Months in reverse 0 points  Repeat phrase 0 points  Total Score 0    Immunizations Immunization History  Administered Date(s) Administered  . Influenza, High Dose Seasonal PF 07/31/2015, 07/06/2016, 06/07/2018, 05/24/2019  . Influenza,inj,Quad PF,6+ Mos 06/20/2013, 06/24/2014  . Influenza,inj,quad, With Preservative 07/04/2017  . Influenza-Unspecified 07/11/2017  . PFIZER SARS-COV-2 Vaccination 10/19/2019, 11/09/2019  . Pneumococcal Conjugate-13 06/24/2014  . Pneumococcal Polysaccharide-23 10/09/2009  . Td 10/09/2009, 12/31/2019  . Zoster 08/26/2012  . Zoster Recombinat (Shingrix) 04/24/2018, 07/25/2018    TDAP status: Up to date Flu Vaccine status: Up to date Pneumococcal vaccine status: Up to date Covid-19 vaccine status: Completed vaccines  Qualifies for Shingles Vaccine? Yes   Zostavax completed Yes   Shingrix Completed?: Yes  Screening Tests Health Maintenance  Topic Date Due  .  INFLUENZA VACCINE  05/04/2020  . TETANUS/TDAP  12/30/2029  . DEXA SCAN  Completed  . COVID-19 Vaccine  Completed  . PNA vac Low Risk Adult  Completed  Health Maintenance  There are no preventive care reminders to display for this patient.  Colorectal cancer screening: Completed 04/26/2011. Repeat every 10 years Mammogram Status: No longer required Bone Density status: Completed 08/18/2017. Results reflect: Bone density results: OSTEOPOROSIS. Repeat every 2 years.  Lung Cancer Screening: (Low Dose CT Chest recommended if Age 25-80 years, 30 pack-year currently smoking OR have quit w/in 15years.) does not qualify.   Lung Cancer Screening Referral: N/A  Additional Screening:  Hepatitis C Screening: does not qualify;   Vision Screening: Recommended annual ophthalmology exams for early detection of glaucoma and other disorders of the eye. Is the patient up to date with their annual eye exam?  Yes  Who is the provider or what is the name of the office in which the patient attends annual eye exams? [facility]. Dr .Satira Sark If pt is not established with a provider, would they like to be referred to a provider to establish care? No .   Dental Screening: Recommended annual dental exams for proper oral hygiene  Community Resource Referral / Chronic Care Management: CRR required this visit?  No   CCM required this visit?  No      Plan:     I have personally reviewed and noted the following in the patient's chart:   . Medical and social history . Use of alcohol, tobacco or illicit drugs  . Current medications and supplements . Functional ability and status . Nutritional status . Physical activity . Advanced directives . List of other physicians . Hospitalizations, surgeries, and ER visits in previous 12 months . Vitals . Screenings to include cognitive, depression, and falls . Referrals and appointments  In addition, I have reviewed and discussed with patient  certain preventive protocols, quality metrics, and best practice recommendations. A written personalized care plan for preventive services as well as general preventive health recommendations were provided to patient.     Ofilia Neas, LPN   4/60/0298   Nurse Notes: None

## 2020-04-21 NOTE — Patient Instructions (Signed)
Crystal Little , Thank you for taking time to come for your Medicare Wellness Visit. I appreciate your ongoing commitment to your health goals. Please review the following plan we discussed and let me know if I can assist you in the future.   Screening recommendations/referrals: Colonoscopy: Up to date, due 04/25/2021 Mammogram: Up to date, no longer required Bone Density: No longer required Recommended yearly ophthalmology/optometry visit for glaucoma screening and checkup Recommended yearly dental visit for hygiene and checkup  Vaccinations: Influenza vaccine: Up to date, due 05/2020 Pneumococcal vaccine: Completed series Tdap vaccine: Up to date, next due 12/30/2029 Shingles vaccine: completed series    Advanced directives: Copy on file  Conditions/risks identified: None  Next appointment: None   Preventive Care 77 Years and Older, Female Preventive care refers to lifestyle choices and visits with your health care provider that can promote health and wellness. What does preventive care include?  A yearly physical exam. This is also called an annual well check.  Dental exams once or twice a year.  Routine eye exams. Ask your health care provider how often you should have your eyes checked.  Personal lifestyle choices, including:  Daily care of your teeth and gums.  Regular physical activity.  Eating a healthy diet.  Avoiding tobacco and drug use.  Limiting alcohol use.  Practicing safe sex.  Taking low-dose aspirin every day.  Taking vitamin and mineral supplements as recommended by your health care provider. What happens during an annual well check? The services and screenings done by your health care provider during your annual well check will depend on your age, overall health, lifestyle risk factors, and family history of disease. Counseling  Your health care provider may ask you questions about your:  Alcohol use.  Tobacco use.  Drug use.  Emotional  well-being.  Home and relationship well-being.  Sexual activity.  Eating habits.  History of falls.  Memory and ability to understand (cognition).  Work and work Statistician.  Reproductive health. Screening  You may have the following tests or measurements:  Height, weight, and BMI.  Blood pressure.  Lipid and cholesterol levels. These may be checked every 5 years, or more frequently if you are over 28 years old.  Skin check.  Lung cancer screening. You may have this screening every year starting at age 20 if you have a 30-pack-year history of smoking and currently smoke or have quit within the past 15 years.  Fecal occult blood test (FOBT) of the stool. You may have this test every year starting at age 56.  Flexible sigmoidoscopy or colonoscopy. You may have a sigmoidoscopy every 5 years or a colonoscopy every 10 years starting at age 51.  Hepatitis C blood test.  Hepatitis B blood test.  Sexually transmitted disease (STD) testing.  Diabetes screening. This is done by checking your blood sugar (glucose) after you have not eaten for a while (fasting). You may have this done every 1-3 years.  Bone density scan. This is done to screen for osteoporosis. You may have this done starting at age 56.  Mammogram. This may be done every 1-2 years. Talk to your health care provider about how often you should have regular mammograms. Talk with your health care provider about your test results, treatment options, and if necessary, the need for more tests. Vaccines  Your health care provider may recommend certain vaccines, such as:  Influenza vaccine. This is recommended every year.  Tetanus, diphtheria, and acellular pertussis (Tdap, Td) vaccine. You may  need a Td booster every 10 years.  Zoster vaccine. You may need this after age 62.  Pneumococcal 13-valent conjugate (PCV13) vaccine. One dose is recommended after age 22.  Pneumococcal polysaccharide (PPSV23) vaccine. One  dose is recommended after age 51. Talk to your health care provider about which screenings and vaccines you need and how often you need them. This information is not intended to replace advice given to you by your health care provider. Make sure you discuss any questions you have with your health care provider. Document Released: 10/17/2015 Document Revised: 06/09/2016 Document Reviewed: 07/22/2015 Elsevier Interactive Patient Education  2017 [place] Prevention in the Home Falls can cause injuries. They can happen to people of all ages. There are many things you can do to make your home safe and to help prevent falls. What can I do on the outside of my home?  Regularly fix the edges of walkways and driveways and fix any cracks.  Remove anything that might make you trip as you walk through a door, such as a raised step or threshold.  Trim any bushes or trees on the path to your home.  Use bright outdoor lighting.  Clear any walking paths of anything that might make someone trip, such as rocks or tools.  Regularly check to see if handrails are loose or broken. Make sure that both sides of any steps have handrails.  Any raised decks and porches should have guardrails on the edges.  Have any leaves, snow, or ice cleared regularly.  Use sand or salt on walking paths during winter.  Clean up any spills in your garage right away. This includes oil or grease spills. What can I do in the bathroom?  Use night lights.  Install grab bars by the toilet and in the tub and shower. Do not use towel bars as grab bars.  Use non-skid mats or decals in the tub or shower.  If you need to sit down in the shower, use a plastic, non-slip stool.  Keep the floor dry. Clean up any water that spills on the floor as soon as it happens.  Remove soap buildup in the tub or shower regularly.  Attach bath mats securely with double-sided non-slip rug tape.  Do not have throw rugs and other  things on the floor that can make you trip. What can I do in the bedroom?  Use night lights.  Make sure that you have a light by your bed that is easy to reach.  Do not use any sheets or blankets that are too big for your bed. They should not hang down onto the floor.  Have a firm chair that has side arms. You can use this for support while you get dressed.  Do not have throw rugs and other things on the floor that can make you trip. What can I do in the kitchen?  Clean up any spills right away.  Avoid walking on wet floors.  Keep items that you use a lot in easy-to-reach places.  If you need to reach something above you, use a strong step stool that has a grab bar.  Keep electrical cords out of the way.  Do not use floor polish or wax that makes floors slippery. If you must use wax, use non-skid floor wax.  Do not have throw rugs and other things on the floor that can make you trip. What can I do with my stairs?  Do not leave any items on  the stairs.  Make sure that there are handrails on both sides of the stairs and use them. Fix handrails that are broken or loose. Make sure that handrails are as long as the stairways.  Check any carpeting to make sure that it is firmly attached to the stairs. Fix any carpet that is loose or worn.  Avoid having throw rugs at the top or bottom of the stairs. If you do have throw rugs, attach them to the floor with carpet tape.  Make sure that you have a light switch at the top of the stairs and the bottom of the stairs. If you do not have them, ask someone to add them for you. What else can I do to help prevent falls?  Wear shoes that:  Do not have high heels.  Have rubber bottoms.  Are comfortable and fit you well.  Are closed at the toe. Do not wear sandals.  If you use a stepladder:  Make sure that it is fully opened. Do not climb a closed stepladder.  Make sure that both sides of the stepladder are locked into place.  Ask  someone to hold it for you, if possible.  Clearly mark and make sure that you can see:  Any grab bars or handrails.  First and last steps.  Where the edge of each step is.  Use tools that help you move around (mobility aids) if they are needed. These include:  Canes.  Walkers.  Scooters.  Crutches.  Turn on the lights when you go into a dark area. Replace any light bulbs as soon as they burn out.  Set up your furniture so you have a clear path. Avoid moving your furniture around.  If any of your floors are uneven, fix them.  If there are any pets around you, be aware of where they are.  Review your medicines with your doctor. Some medicines can make you feel dizzy. This can increase your chance of falling. Ask your doctor what other things that you can do to help prevent falls. This information is not intended to replace advice given to you by your health care provider. Make sure you discuss any questions you have with your health care provider. Document Released: 07/17/2009 Document Revised: 02/26/2016 Document Reviewed: 10/25/2014 Elsevier Interactive Patient Education  2017 [employer].

## 2020-05-07 DIAGNOSIS — Z1231 Encounter for screening mammogram for malignant neoplasm of breast: Secondary | ICD-10-CM | POA: Diagnosis not present

## 2020-07-07 DIAGNOSIS — H40013 Open angle with borderline findings, low risk, bilateral: Secondary | ICD-10-CM | POA: Diagnosis not present

## 2020-07-28 DIAGNOSIS — M5416 Radiculopathy, lumbar region: Secondary | ICD-10-CM | POA: Diagnosis not present

## 2020-08-14 DIAGNOSIS — M5136 Other intervertebral disc degeneration, lumbar region: Secondary | ICD-10-CM | POA: Diagnosis not present

## 2020-09-12 DIAGNOSIS — M5136 Other intervertebral disc degeneration, lumbar region: Secondary | ICD-10-CM | POA: Diagnosis not present

## 2020-09-12 DIAGNOSIS — M5459 Other low back pain: Secondary | ICD-10-CM | POA: Diagnosis not present

## 2020-10-10 ENCOUNTER — Other Ambulatory Visit: Payer: Self-pay | Admitting: Family Medicine

## 2020-10-14 DIAGNOSIS — M5416 Radiculopathy, lumbar region: Secondary | ICD-10-CM | POA: Diagnosis not present

## 2020-10-22 DIAGNOSIS — M25562 Pain in left knee: Secondary | ICD-10-CM | POA: Diagnosis not present

## 2020-10-22 DIAGNOSIS — M5416 Radiculopathy, lumbar region: Secondary | ICD-10-CM | POA: Diagnosis not present

## 2020-10-24 DIAGNOSIS — M1712 Unilateral primary osteoarthritis, left knee: Secondary | ICD-10-CM | POA: Diagnosis not present

## 2020-11-07 DIAGNOSIS — H40013 Open angle with borderline findings, low risk, bilateral: Secondary | ICD-10-CM | POA: Diagnosis not present

## 2020-11-07 DIAGNOSIS — H26491 Other secondary cataract, right eye: Secondary | ICD-10-CM | POA: Diagnosis not present

## 2020-11-07 DIAGNOSIS — H43813 Vitreous degeneration, bilateral: Secondary | ICD-10-CM | POA: Diagnosis not present

## 2020-11-11 DIAGNOSIS — M5416 Radiculopathy, lumbar region: Secondary | ICD-10-CM | POA: Diagnosis not present

## 2020-11-23 ENCOUNTER — Other Ambulatory Visit: Payer: Self-pay | Admitting: Family Medicine

## 2020-11-24 DIAGNOSIS — M5416 Radiculopathy, lumbar region: Secondary | ICD-10-CM | POA: Diagnosis not present

## 2020-12-17 ENCOUNTER — Other Ambulatory Visit: Payer: Self-pay | Admitting: Family Medicine

## 2020-12-31 ENCOUNTER — Encounter: Payer: Self-pay | Admitting: Family Medicine

## 2020-12-31 ENCOUNTER — Other Ambulatory Visit: Payer: Self-pay

## 2020-12-31 ENCOUNTER — Ambulatory Visit (INDEPENDENT_AMBULATORY_CARE_PROVIDER_SITE_OTHER): Payer: Medicare Other | Admitting: Family Medicine

## 2020-12-31 VITALS — BP 124/70 | HR 79 | Temp 97.8°F | Ht 65.0 in | Wt 234.0 lb

## 2020-12-31 DIAGNOSIS — M8949 Other hypertrophic osteoarthropathy, multiple sites: Secondary | ICD-10-CM | POA: Diagnosis not present

## 2020-12-31 DIAGNOSIS — M159 Polyosteoarthritis, unspecified: Secondary | ICD-10-CM

## 2020-12-31 DIAGNOSIS — R609 Edema, unspecified: Secondary | ICD-10-CM

## 2020-12-31 DIAGNOSIS — D649 Anemia, unspecified: Secondary | ICD-10-CM | POA: Diagnosis not present

## 2020-12-31 DIAGNOSIS — K219 Gastro-esophageal reflux disease without esophagitis: Secondary | ICD-10-CM | POA: Diagnosis not present

## 2020-12-31 DIAGNOSIS — R7303 Prediabetes: Secondary | ICD-10-CM

## 2020-12-31 LAB — CBC WITH DIFFERENTIAL/PLATELET
Basophils Absolute: 0.1 10*3/uL (ref 0.0–0.1)
Basophils Relative: 0.7 % (ref 0.0–3.0)
Eosinophils Absolute: 0.2 10*3/uL (ref 0.0–0.7)
Eosinophils Relative: 1.7 % (ref 0.0–5.0)
HCT: 37.2 % (ref 36.0–46.0)
Hemoglobin: 12.2 g/dL (ref 12.0–15.0)
Lymphocytes Relative: 26.9 % (ref 12.0–46.0)
Lymphs Abs: 2.4 10*3/uL (ref 0.7–4.0)
MCHC: 32.7 g/dL (ref 30.0–36.0)
MCV: 89.4 fl (ref 78.0–100.0)
Monocytes Absolute: 0.7 10*3/uL (ref 0.1–1.0)
Monocytes Relative: 7.3 % (ref 3.0–12.0)
Neutro Abs: 5.7 10*3/uL (ref 1.4–7.7)
Neutrophils Relative %: 63.4 % (ref 43.0–77.0)
Platelets: 313 10*3/uL (ref 150.0–400.0)
RBC: 4.17 Mil/uL (ref 3.87–5.11)
RDW: 15.1 % (ref 11.5–15.5)
WBC: 9.1 10*3/uL (ref 4.0–10.5)

## 2020-12-31 LAB — LIPID PANEL
Cholesterol: 176 mg/dL (ref 0–200)
HDL: 71.6 mg/dL (ref >39.00)
LDL Cholesterol: 90 mg/dL (ref 0–99)
NonHDL: 103.9
Total CHOL/HDL Ratio: 2
Triglycerides: 72 mg/dL (ref 0.0–149.0)
VLDL: 14.4 mg/dL (ref 0.0–40.0)

## 2020-12-31 LAB — BASIC METABOLIC PANEL
BUN: 20 mg/dL (ref 6–23)
CO2: 32 mEq/L (ref 19–32)
Calcium: 9.9 mg/dL (ref 8.4–10.5)
Chloride: 100 mEq/L (ref 96–112)
Creatinine, Ser: 0.86 mg/dL (ref 0.40–1.20)
GFR: 63.42 mL/min (ref >60.00)
Glucose, Bld: 94 mg/dL (ref 70–99)
Potassium: 4.3 mEq/L (ref 3.5–5.1)
Sodium: 140 mEq/L (ref 135–145)

## 2020-12-31 LAB — HEPATIC FUNCTION PANEL
ALT: 11 U/L (ref 0–35)
AST: 15 U/L (ref 0–37)
Albumin: 4.3 g/dL (ref 3.5–5.2)
Alkaline Phosphatase: 74 U/L (ref 39–117)
Bilirubin, Direct: 0.2 mg/dL (ref 0.0–0.3)
Total Bilirubin: 0.7 mg/dL (ref 0.2–1.2)
Total Protein: 7.4 g/dL (ref 6.0–8.3)

## 2020-12-31 LAB — HEMOGLOBIN A1C: Hgb A1c MFr Bld: 6 % (ref 4.6–6.5)

## 2020-12-31 LAB — TSH: TSH: 2.4 u[IU]/mL (ref 0.35–4.50)

## 2020-12-31 NOTE — Progress Notes (Signed)
Subjective:    Patient ID: Crystal Little, female    DOB: [birth year], 80 y.o.   MRN: [id number]  HPI Here to follow up on issues. She has been seeing Dr. Nelva Bush for low back pain and pain in the legs. An MRI in January revealed lumbar spinal stenosis. He has given her 2 epidural steroid injections but these have not helped. Otherwise she is doing well. Her OA and GERD are stable. Her ankle edema is stable.    Review of Systems  Constitutional: Negative.   HENT: Negative.   Eyes: Negative.   Respiratory: Negative.   Cardiovascular: Negative.   Gastrointestinal: Negative.   Genitourinary: Negative for decreased urine volume, difficulty urinating, dyspareunia, dysuria, enuresis, flank pain, frequency, hematuria, pelvic pain and urgency.  Musculoskeletal: Positive for arthralgias and back pain.  Skin: Negative.   Neurological: Negative.   Psychiatric/Behavioral: Negative.        Objective:   Physical Exam Constitutional:      General: She is not in acute distress.    Appearance: She is well-developed. She is obese.  HENT:     Head: Normocephalic and atraumatic.     Right Ear: External ear normal.     Left Ear: External ear normal.     Nose: Nose normal.     Mouth/Throat:     Pharynx: No oropharyngeal exudate.  Eyes:     General: No scleral icterus.    Conjunctiva/sclera: Conjunctivae normal.     Pupils: Pupils are equal, round, and reactive to light.  Neck:     Thyroid: No thyromegaly.     Vascular: No JVD.  Cardiovascular:     Rate and Rhythm: Normal rate and regular rhythm.     Heart sounds: Normal heart sounds. No murmur heard. No friction rub. No gallop.   Pulmonary:     Effort: Pulmonary effort is normal. No respiratory distress.     Breath sounds: Normal breath sounds. No wheezing or rales.  Chest:     Chest wall: No tenderness.  Abdominal:     General: Bowel sounds are normal. There is no distension.     Palpations: Abdomen is soft. There is no mass.      Tenderness: There is no abdominal tenderness. There is no guarding or rebound.  Musculoskeletal:        General: No tenderness. Normal range of motion.     Cervical back: Normal range of motion and neck supple.  Lymphadenopathy:     Cervical: No cervical adenopathy.  Skin:    General: Skin is warm and dry.     Findings: No erythema or rash.  Neurological:     Mental Status: She is alert and oriented to person, place, and time.     Cranial Nerves: No cranial nerve deficit.     Motor: No abnormal muscle tone.     Coordination: Coordination normal.     Deep Tendon Reflexes: Reflexes are normal and symmetric. Reflexes normal.  Psychiatric:        Behavior: Behavior normal.        Thought Content: Thought content normal.        Judgment: Judgment normal.           Assessment & Plan:  She has lumbar spinal stenosis and she will see Dr. Nelva Bush again in a few weeks. I think she could benefit from PT, and I told her to ask Dr. Nelva Bush about this. Her OA and GERD are stable. We will get fasting  labs to check an A1c, etc.  Alysia Penna, MD

## 2021-01-10 ENCOUNTER — Other Ambulatory Visit: Payer: Self-pay | Admitting: Family Medicine

## 2021-01-26 DIAGNOSIS — M48062 Spinal stenosis, lumbar region with neurogenic claudication: Secondary | ICD-10-CM | POA: Diagnosis not present

## 2021-01-26 DIAGNOSIS — M5136 Other intervertebral disc degeneration, lumbar region: Secondary | ICD-10-CM | POA: Diagnosis not present

## 2021-02-06 DIAGNOSIS — M48062 Spinal stenosis, lumbar region with neurogenic claudication: Secondary | ICD-10-CM | POA: Diagnosis not present

## 2021-02-06 DIAGNOSIS — M5459 Other low back pain: Secondary | ICD-10-CM | POA: Diagnosis not present

## 2021-02-18 DIAGNOSIS — M5459 Other low back pain: Secondary | ICD-10-CM | POA: Diagnosis not present

## 2021-02-18 DIAGNOSIS — M48062 Spinal stenosis, lumbar region with neurogenic claudication: Secondary | ICD-10-CM | POA: Diagnosis not present

## 2021-03-05 DIAGNOSIS — M48062 Spinal stenosis, lumbar region with neurogenic claudication: Secondary | ICD-10-CM | POA: Diagnosis not present

## 2021-03-05 DIAGNOSIS — M5459 Other low back pain: Secondary | ICD-10-CM | POA: Diagnosis not present

## 2021-03-26 DIAGNOSIS — M5459 Other low back pain: Secondary | ICD-10-CM | POA: Diagnosis not present

## 2021-03-26 DIAGNOSIS — M48062 Spinal stenosis, lumbar region with neurogenic claudication: Secondary | ICD-10-CM | POA: Diagnosis not present

## 2021-04-03 DIAGNOSIS — D1801 Hemangioma of skin and subcutaneous tissue: Secondary | ICD-10-CM | POA: Diagnosis not present

## 2021-04-03 DIAGNOSIS — B351 Tinea unguium: Secondary | ICD-10-CM | POA: Diagnosis not present

## 2021-04-03 DIAGNOSIS — L821 Other seborrheic keratosis: Secondary | ICD-10-CM | POA: Diagnosis not present

## 2021-04-03 DIAGNOSIS — D225 Melanocytic nevi of trunk: Secondary | ICD-10-CM | POA: Diagnosis not present

## 2021-04-10 DIAGNOSIS — M48062 Spinal stenosis, lumbar region with neurogenic claudication: Secondary | ICD-10-CM | POA: Diagnosis not present

## 2021-04-10 DIAGNOSIS — M5459 Other low back pain: Secondary | ICD-10-CM | POA: Diagnosis not present

## 2021-04-13 ENCOUNTER — Telehealth: Payer: Self-pay | Admitting: Family Medicine

## 2021-04-13 NOTE — Telephone Encounter (Signed)
Left message for patient to call back and schedule Medicare Annual Wellness Visit (AWV) either virtually or in office.   Last AWV 04/21/20 please schedule at anytime with [facility] Nurse Health Advisor 1 or 2   This should be a 45 minute visit.

## 2021-04-14 NOTE — Telephone Encounter (Signed)
Pt called in stating that she will call back to schedule at a later time.

## 2021-04-16 NOTE — Telephone Encounter (Signed)
Documented on spreadsheet 

## 2021-04-17 DIAGNOSIS — M48062 Spinal stenosis, lumbar region with neurogenic claudication: Secondary | ICD-10-CM | POA: Diagnosis not present

## 2021-04-17 DIAGNOSIS — M5459 Other low back pain: Secondary | ICD-10-CM | POA: Diagnosis not present

## 2021-04-24 DIAGNOSIS — M5459 Other low back pain: Secondary | ICD-10-CM | POA: Diagnosis not present

## 2021-04-24 DIAGNOSIS — M48062 Spinal stenosis, lumbar region with neurogenic claudication: Secondary | ICD-10-CM | POA: Diagnosis not present

## 2021-04-27 DIAGNOSIS — M48062 Spinal stenosis, lumbar region with neurogenic claudication: Secondary | ICD-10-CM | POA: Diagnosis not present

## 2021-04-27 DIAGNOSIS — M5136 Other intervertebral disc degeneration, lumbar region: Secondary | ICD-10-CM | POA: Diagnosis not present

## 2021-05-05 DIAGNOSIS — M48062 Spinal stenosis, lumbar region with neurogenic claudication: Secondary | ICD-10-CM | POA: Diagnosis not present

## 2021-05-05 DIAGNOSIS — M5459 Other low back pain: Secondary | ICD-10-CM | POA: Diagnosis not present

## 2021-05-15 DIAGNOSIS — M48062 Spinal stenosis, lumbar region with neurogenic claudication: Secondary | ICD-10-CM | POA: Diagnosis not present

## 2021-05-15 DIAGNOSIS — M5459 Other low back pain: Secondary | ICD-10-CM | POA: Diagnosis not present

## 2021-05-19 DIAGNOSIS — M5459 Other low back pain: Secondary | ICD-10-CM | POA: Diagnosis not present

## 2021-05-19 DIAGNOSIS — M48062 Spinal stenosis, lumbar region with neurogenic claudication: Secondary | ICD-10-CM | POA: Diagnosis not present

## 2021-05-25 DIAGNOSIS — M5459 Other low back pain: Secondary | ICD-10-CM | POA: Diagnosis not present

## 2021-05-25 DIAGNOSIS — M48062 Spinal stenosis, lumbar region with neurogenic claudication: Secondary | ICD-10-CM | POA: Diagnosis not present

## 2021-06-01 DIAGNOSIS — M48062 Spinal stenosis, lumbar region with neurogenic claudication: Secondary | ICD-10-CM | POA: Diagnosis not present

## 2021-06-01 DIAGNOSIS — M5459 Other low back pain: Secondary | ICD-10-CM | POA: Diagnosis not present

## 2021-06-03 ENCOUNTER — Ambulatory Visit: Payer: Medicare Other

## 2021-06-03 ENCOUNTER — Ambulatory Visit (INDEPENDENT_AMBULATORY_CARE_PROVIDER_SITE_OTHER): Payer: Medicare Other

## 2021-06-03 DIAGNOSIS — Z0000 Encounter for general adult medical examination without abnormal findings: Secondary | ICD-10-CM

## 2021-06-03 NOTE — Progress Notes (Signed)
Subjective:   KALEA PERINE is a 80 y.o. female who presents for Medicare Annual (Subsequent) preventive examination.   I connected with [name] today by telephone and verified that I am speaking with the correct person using two identifiers. Location patient: home Location provider: work Persons participating in the virtual visit: patient, provider.   I discussed the limitations, risks, security and privacy concerns of performing an evaluation and management service by telephone and the availability of in person appointments. I also discussed with the patient that there may be a patient responsible charge related to this service. The patient expressed understanding and verbally consented to this telephonic visit.    Interactive audio and video telecommunications were attempted between this provider and patient, however failed, due to patient having technical difficulties OR patient did not have access to video capability.  We continued and completed visit with audio only.    Review of Systems    N/a       Objective:    There were no vitals filed for this visit. There is no height or weight on file to calculate BMI.  Advanced Directives 04/21/2020 10/12/2017 07/04/2017 02/18/2015 01/03/2012  Does Patient Have a Medical Advance Directive? Yes Yes Yes Yes Patient has advance directive, copy not in chart  Type of Advance Directive [place];Living will - [place];Living will - -  Copy of [place] in Chart? Yes - validated most recent copy scanned in chart (See row information) - No - copy requested No - copy requested -    Current Medications (verified) Outpatient Encounter Medications as of 06/03/2021  Medication Sig   Ascorbic Acid (VITAMIN C) 100 MG tablet Take 100 mg by mouth daily.   Calcium-Vitamin D-Vitamin K (VIACTIV CALCIUM PLUS D) 650-12.5-40 MG-MCG-MCG CHEW Viactiv   Cholecalciferol (VITAMIN D) 2000 UNITS  CAPS Take by mouth.   furosemide (LASIX) 20 MG tablet TAKE 1 OR 2 TABLETS A DAY AS NEEDED FOR FLUID   Influenza vac split quadrivalent PF (FLUZONE HIGH-DOSE) 0.5 ML injection Fluzone High-Dose 2019-20 (PF) 180 mcg/0.5 mL intramuscular syringe  TO BE ADMINISTERED BY PHARMACIST FOR IMMUNIZATION   NAFTIN 2 % GEL Apply topically 2 (two) times daily.   omeprazole (PRILOSEC) 40 MG capsule Take one tablet by mouth daily   terbinafine (LAMISIL) 250 MG tablet Take 1 tablet (250 mg total) by mouth daily.   Zoster Vaccine Adjuvanted [facility]) injection Shingrix (PF) 50 mcg/0.5 mL intramuscular suspension, kit  TO BE ADMINISTERED BY PHARMACIST FOR IMMUNIZATION   No facility-administered encounter medications on file as of 06/03/2021.    Allergies (verified) Amoxicillin   History: Past Medical History:  Diagnosis Date   Diverticulosis    Frequent UTI    sees Dr. Rosana Hoes   GERD (gastroesophageal reflux disease)    Gynecological examination    sees Dr. Sander Radon   IBS (irritable bowel syndrome)    Leg edema    Osteoarthritis    Status post dilation of esophageal narrowing    Varicose veins of legs    sees [facility]    Past Surgical History:  Procedure Laterality Date   COLONOSCOPY  04-26-11   per Dr. Sharlett Iles, diverticulosis only, no repeats needed   endovascular repair of bilateral leg varicosities     per Dr. Jones Skene    ESOPHAGOGASTRODUODENOSCOPY  01-19-10   per Dr. Sharlett Iles, with dilatation    ESOPHAGOGASTRODUODENOSCOPY (EGD) WITH ESOPHAGEAL DILATION     right knee arthroscopy  12/17/05 and 01-06-12   per Dr. Percell Miller   TONSILLECTOMY     Family History  Problem Relation Age of Onset   Irritable bowel syndrome Mother    Pancreatic cancer Paternal Grandmother    Diabetes Maternal Aunt    Diabetes Maternal Uncle    Heart disease Paternal Grandfather    Colon cancer Neg Hx    Colon polyps Neg Hx    Kidney disease Neg Hx    Social History   Socioeconomic History    Marital status: Married    Spouse name: Not on file   Number of children: 3   Years of education: Not on file   Highest education level: Not on file  Occupational History   Occupation: [employer] photographer  Tobacco Use   Smoking status: Never   Smokeless tobacco: Never  Substance and Sexual Activity   Alcohol use: Yes    Alcohol/week: 2.0 standard drinks    Types: 2 Glasses of wine per week    Comment: occ   Drug use: No   Sexual activity: Not on file  Other Topics Concern   Not on file  Social History Narrative   Not on file   Social Determinants of Health   Financial Resource Strain: Not on file  Food Insecurity: Not on file  Transportation Needs: Not on file  Physical Activity: Not on file  Stress: Not on file  Social Connections: Not on file    Tobacco Counseling Counseling given: Not Answered   Clinical Intake:                 Diabetic?no         Activities of Daily Living No flowsheet data found.  Patient Care Team: Laurey Morale, MD as PCP - General  Indicate any recent Medical Services you may have received from other than [facility] providers in the past year (date may be approximate).     Assessment:   This is a routine wellness examination for Henri.  Hearing/Vision screen No results found.  Dietary issues and exercise activities discussed:     Goals Addressed   None    Depression Screen PHQ 2/9 Scores 04/21/2020 10/24/2018 10/12/2017 09/05/2017 08/27/2015  PHQ - 2 Score 0 0 0 0 0  PHQ- 9 Score 0 - - - -    Fall Risk Fall Risk  04/21/2020 10/24/2018 10/12/2017 09/05/2017 08/27/2015  Falls in the past year? 0 0 No No Yes  Number falls in past yr: 0 - - - 1  Injury with Fall? 0 - - - No  Risk for fall due to : Medication side effect - - - -  Follow up Falls evaluation completed - - - Falls evaluation completed    FALL RISK PREVENTION PERTAINING TO THE HOME:  Any stairs in or around the home? Yes  If so, are there any without handrails?  No  Home free of loose throw rugs in walkways, pet beds, electrical cords, etc? Yes  Adequate lighting in your home to reduce risk of falls? Yes   ASSISTIVE DEVICES UTILIZED TO PREVENT FALLS:  Life alert? No  Use of a cane, walker or w/c? No  Grab bars in the bathroom? Yes  Shower chair or bench in shower? Yes  Elevated toilet seat or a handicapped toilet? Yes    Cognitive Function: Normal cognitive status assessed by direct observation by this Nurse Health Advisor. No abnormalities found.   MMSE - Mini Mental State Exam 10/12/2017  Not completed: (  No Data)     6CIT Screen 04/21/2020  What Year? 0 points  What month? 0 points  What time? 0 points  Count back from 20 0 points  Months in reverse 0 points  Repeat phrase 0 points  Total Score 0    Immunizations Immunization History  Administered Date(s) Administered   Influenza, High Dose Seasonal PF 07/31/2015, 07/06/2016, 06/07/2018, 05/24/2019   Influenza,inj,Quad PF,6+ Mos 06/20/2013, 06/24/2014   Influenza,inj,quad, With Preservative 07/04/2017   Influenza-Unspecified 07/11/2017, 06/04/2020   PFIZER(Purple Top)SARS-COV-2 Vaccination 10/19/2019, 11/09/2019   Pneumococcal Conjugate-13 06/24/2014   Pneumococcal Polysaccharide-23 10/09/2009   Td 10/09/2009, 12/31/2019   Zoster Recombinat (Shingrix) 04/24/2018, 07/25/2018   Zoster, Live 08/26/2012    TDAP status: Up to date  Flu Vaccine status: Up to date  Pneumococcal vaccine status: Up to date  Covid-19 vaccine status: Completed vaccines  Qualifies for Shingles Vaccine? Yes   Zostavax completed Yes   Shingrix Completed?: Yes  Screening Tests Health Maintenance  Topic Date Due   INFLUENZA VACCINE  05/04/2021   TETANUS/TDAP  12/30/2029   DEXA SCAN  Completed   COVID-19 Vaccine  Completed   PNA vac Low Risk Adult  Completed   Zoster Vaccines- Shingrix  Completed   HPV VACCINES  Aged Out    Health Maintenance  Health Maintenance Due  Topic Date Due    INFLUENZA VACCINE  05/04/2021    Colorectal cancer screening: No longer required.   Mammogram status: No longer required due to e.  Bone Density status: Completed 08/18/2017. Results reflect: Bone density results: OSTEOPENIA. Repeat every 5 years.  Lung Cancer Screening: (Low Dose CT Chest recommended if Age 66-80 years, 30 pack-year currently smoking OR have quit w/in 15years.) does not qualify.   Lung Cancer Screening Referral: n/a  Additional Screening:  Hepatitis C Screening: does not qualify;  Vision Screening: Recommended annual ophthalmology exams for early detection of glaucoma and other disorders of the eye. Is the patient up to date with their annual eye exam?  Yes  Who is the provider or what is the name of the office in which the patient attends annual eye exams? Dr.Tanner If pt is not established with a provider, would they like to be referred to a provider to establish care? No .   Dental Screening: Recommended annual dental exams for proper oral hygiene  Community Resource Referral / Chronic Care Management: CRR required this visit?  No   CCM required this visit?  No      Plan:     I have personally reviewed and noted the following in the patient's chart:   Medical and social history Use of alcohol, tobacco or illicit drugs  Current medications and supplements including opioid prescriptions.  Functional ability and status Nutritional status Physical activity Advanced directives List of other physicians Hospitalizations, surgeries, and ER visits in previous 12 months Vitals Screenings to include cognitive, depression, and falls Referrals and appointments  In addition, I have reviewed and discussed with patient certain preventive protocols, quality metrics, and best practice recommendations. A written personalized care plan for preventive services as well as general preventive health recommendations were provided to patient.     Randel Pigg,  LPN   [id number]   Nurse Notes: none

## 2021-06-03 NOTE — Patient Instructions (Signed)
Crystal Little , Thank you for taking time to come for your Medicare Wellness Visit. I appreciate your ongoing commitment to your health goals. Please review the following plan we discussed and let me know if I can assist you in the future.   Screening recommendations/referrals: Colonoscopy: no longer required  Mammogram: no longer required  Bone Density: 08/18/2017 Recommended yearly ophthalmology/optometry visit for glaucoma screening and checkup Recommended yearly dental visit for hygiene and checkup  Vaccinations: Influenza vaccine: due in fall 2022  Pneumococcal vaccine: completed series  Tdap vaccine: 12/31/2019 Shingles vaccine: completed series     Advanced directives: will provide copies   Conditions/risks identified: none   Next appointment: none    Preventive Care 82 Years and Older, Female Preventive care refers to lifestyle choices and visits with your health care provider that can promote health and wellness. What does preventive care include? A yearly physical exam. This is also called an annual well check. Dental exams once or twice a year. Routine eye exams. Ask your health care provider how often you should have your eyes checked. Personal lifestyle choices, including: Daily care of your teeth and gums. Regular physical activity. Eating a healthy diet. Avoiding tobacco and drug use. Limiting alcohol use. Practicing safe sex. Taking low-dose aspirin every day. Taking vitamin and mineral supplements as recommended by your health care provider. What happens during an annual well check? The services and screenings done by your health care provider during your annual well check will depend on your age, overall health, lifestyle risk factors, and family history of disease. Counseling  Your health care provider may ask you questions about your: Alcohol use. Tobacco use. Drug use. Emotional well-being. Home and relationship well-being. Sexual activity. Eating  habits. History of falls. Memory and ability to understand (cognition). Work and work Statistician. Reproductive health. Screening  You may have the following tests or measurements: Height, weight, and BMI. Blood pressure. Lipid and cholesterol levels. These may be checked every 5 years, or more frequently if you are over 19 years old. Skin check. Lung cancer screening. You may have this screening every year starting at age 86 if you have a 30-pack-year history of smoking and currently smoke or have quit within the past 15 years. Fecal occult blood test (FOBT) of the stool. You may have this test every year starting at age 35. Flexible sigmoidoscopy or colonoscopy. You may have a sigmoidoscopy every 5 years or a colonoscopy every 10 years starting at age 61. Hepatitis C blood test. Hepatitis B blood test. Sexually transmitted disease (STD) testing. Diabetes screening. This is done by checking your blood sugar (glucose) after you have not eaten for a while (fasting). You may have this done every 1-3 years. Bone density scan. This is done to screen for osteoporosis. You may have this done starting at age 80. Mammogram. This may be done every 1-2 years. Talk to your health care provider about how often you should have regular mammograms. Talk with your health care provider about your test results, treatment options, and if necessary, the need for more tests. Vaccines  Your health care provider may recommend certain vaccines, such as: Influenza vaccine. This is recommended every year. Tetanus, diphtheria, and acellular pertussis (Tdap, Td) vaccine. You may need a Td booster every 10 years. Zoster vaccine. You may need this after age 44. Pneumococcal 13-valent conjugate (PCV13) vaccine. One dose is recommended after age 6. Pneumococcal polysaccharide (PPSV23) vaccine. One dose is recommended after age 64. Talk to your health  care provider about which screenings and vaccines you need and how  often you need them. This information is not intended to replace advice given to you by your health care provider. Make sure you discuss any questions you have with your health care provider. Document Released: 10/17/2015 Document Revised: 06/09/2016 Document Reviewed: 07/22/2015 Elsevier Interactive Patient Education  2017 [place] Prevention in the Home Falls can cause injuries. They can happen to people of all ages. There are many things you can do to make your home safe and to help prevent falls. What can I do on the outside of my home? Regularly fix the edges of walkways and driveways and fix any cracks. Remove anything that might make you trip as you walk through a door, such as a raised step or threshold. Trim any bushes or trees on the path to your home. Use bright outdoor lighting. Clear any walking paths of anything that might make someone trip, such as rocks or tools. Regularly check to see if handrails are loose or broken. Make sure that both sides of any steps have handrails. Any raised decks and porches should have guardrails on the edges. Have any leaves, snow, or ice cleared regularly. Use sand or salt on walking paths during winter. Clean up any spills in your garage right away. This includes oil or grease spills. What can I do in the bathroom? Use night lights. Install grab bars by the toilet and in the tub and shower. Do not use towel bars as grab bars. Use non-skid mats or decals in the tub or shower. If you need to sit down in the shower, use a plastic, non-slip stool. Keep the floor dry. Clean up any water that spills on the floor as soon as it happens. Remove soap buildup in the tub or shower regularly. Attach bath mats securely with double-sided non-slip rug tape. Do not have throw rugs and other things on the floor that can make you trip. What can I do in the bedroom? Use night lights. Make sure that you have a light by your bed that is easy to  reach. Do not use any sheets or blankets that are too big for your bed. They should not hang down onto the floor. Have a firm chair that has side arms. You can use this for support while you get dressed. Do not have throw rugs and other things on the floor that can make you trip. What can I do in the kitchen? Clean up any spills right away. Avoid walking on wet floors. Keep items that you use a lot in easy-to-reach places. If you need to reach something above you, use a strong step stool that has a grab bar. Keep electrical cords out of the way. Do not use floor polish or wax that makes floors slippery. If you must use wax, use non-skid floor wax. Do not have throw rugs and other things on the floor that can make you trip. What can I do with my stairs? Do not leave any items on the stairs. Make sure that there are handrails on both sides of the stairs and use them. Fix handrails that are broken or loose. Make sure that handrails are as long as the stairways. Check any carpeting to make sure that it is firmly attached to the stairs. Fix any carpet that is loose or worn. Avoid having throw rugs at the top or bottom of the stairs. If you do have throw rugs, attach them to  the floor with carpet tape. Make sure that you have a light switch at the top of the stairs and the bottom of the stairs. If you do not have them, ask someone to add them for you. What else can I do to help prevent falls? Wear shoes that: Do not have high heels. Have rubber bottoms. Are comfortable and fit you well. Are closed at the toe. Do not wear sandals. If you use a stepladder: Make sure that it is fully opened. Do not climb a closed stepladder. Make sure that both sides of the stepladder are locked into place. Ask someone to hold it for you, if possible. Clearly mark and make sure that you can see: Any grab bars or handrails. First and last steps. Where the edge of each step is. Use tools that help you move  around (mobility aids) if they are needed. These include: Canes. Walkers. Scooters. Crutches. Turn on the lights when you go into a dark area. Replace any light bulbs as soon as they burn out. Set up your furniture so you have a clear path. Avoid moving your furniture around. If any of your floors are uneven, fix them. If there are any pets around you, be aware of where they are. Review your medicines with your doctor. Some medicines can make you feel dizzy. This can increase your chance of falling. Ask your doctor what other things that you can do to help prevent falls. This information is not intended to replace advice given to you by your health care provider. Make sure you discuss any questions you have with your health care provider. Document Released: 07/17/2009 Document Revised: 02/26/2016 Document Reviewed: 10/25/2014 Elsevier Interactive Patient Education  2017 [employer].

## 2021-06-09 DIAGNOSIS — M48062 Spinal stenosis, lumbar region with neurogenic claudication: Secondary | ICD-10-CM | POA: Diagnosis not present

## 2021-06-09 DIAGNOSIS — M5459 Other low back pain: Secondary | ICD-10-CM | POA: Diagnosis not present

## 2021-06-11 DIAGNOSIS — M5416 Radiculopathy, lumbar region: Secondary | ICD-10-CM | POA: Diagnosis not present

## 2021-06-11 DIAGNOSIS — M4317 Spondylolisthesis, lumbosacral region: Secondary | ICD-10-CM | POA: Diagnosis not present

## 2021-06-11 DIAGNOSIS — R030 Elevated blood-pressure reading, without diagnosis of hypertension: Secondary | ICD-10-CM | POA: Diagnosis not present

## 2021-06-23 DIAGNOSIS — M48062 Spinal stenosis, lumbar region with neurogenic claudication: Secondary | ICD-10-CM | POA: Diagnosis not present

## 2021-06-23 DIAGNOSIS — M5136 Other intervertebral disc degeneration, lumbar region: Secondary | ICD-10-CM | POA: Diagnosis not present

## 2021-07-07 DIAGNOSIS — M4317 Spondylolisthesis, lumbosacral region: Secondary | ICD-10-CM | POA: Diagnosis not present

## 2021-07-07 DIAGNOSIS — R030 Elevated blood-pressure reading, without diagnosis of hypertension: Secondary | ICD-10-CM | POA: Diagnosis not present

## 2021-07-08 ENCOUNTER — Other Ambulatory Visit: Payer: Self-pay | Admitting: Neurological Surgery

## 2021-07-08 ENCOUNTER — Other Ambulatory Visit: Payer: Self-pay | Admitting: Family Medicine

## 2021-07-08 DIAGNOSIS — L600 Ingrowing nail: Secondary | ICD-10-CM | POA: Diagnosis not present

## 2021-07-08 DIAGNOSIS — M792 Neuralgia and neuritis, unspecified: Secondary | ICD-10-CM | POA: Diagnosis not present

## 2021-07-08 DIAGNOSIS — M4316 Spondylolisthesis, lumbar region: Secondary | ICD-10-CM

## 2021-07-08 DIAGNOSIS — B351 Tinea unguium: Secondary | ICD-10-CM | POA: Diagnosis not present

## 2021-07-14 DIAGNOSIS — Z1231 Encounter for screening mammogram for malignant neoplasm of breast: Secondary | ICD-10-CM | POA: Diagnosis not present

## 2021-07-15 ENCOUNTER — Ambulatory Visit
Admission: RE | Admit: 2021-07-15 | Discharge: 2021-07-15 | Disposition: A | Discharge status: Home / Self Care | Payer: Medicare Other | Source: Ambulatory Visit | Attending: Neurological Surgery | Admitting: Neurological Surgery

## 2021-07-15 ENCOUNTER — Other Ambulatory Visit: Payer: Self-pay

## 2021-07-15 DIAGNOSIS — M5126 Other intervertebral disc displacement, lumbar region: Secondary | ICD-10-CM | POA: Diagnosis not present

## 2021-07-15 DIAGNOSIS — M48061 Spinal stenosis, lumbar region without neurogenic claudication: Secondary | ICD-10-CM | POA: Diagnosis not present

## 2021-07-15 DIAGNOSIS — M47816 Spondylosis without myelopathy or radiculopathy, lumbar region: Secondary | ICD-10-CM | POA: Diagnosis not present

## 2021-07-15 DIAGNOSIS — M4316 Spondylolisthesis, lumbar region: Secondary | ICD-10-CM

## 2021-07-15 DIAGNOSIS — M5136 Other intervertebral disc degeneration, lumbar region: Secondary | ICD-10-CM | POA: Diagnosis not present

## 2021-07-15 MED ORDER — ONDANSETRON HCL 4 MG/2ML IJ SOLN: 4.0000 mg | Freq: Once | INTRAMUSCULAR | Status: DC | PRN

## 2021-07-15 MED ORDER — DIAZEPAM 5 MG PO TABS
5.0000 mg | ORAL_TABLET | Freq: Once | ORAL | Status: AC
  Administered 2021-07-15: 5 mg via ORAL

## 2021-07-15 MED ORDER — MEPERIDINE HCL 50 MG/ML IJ SOLN: 50.0000 mg | Freq: Once | INTRAMUSCULAR | Status: DC | PRN

## 2021-07-15 MED ORDER — IOPAMIDOL (ISOVUE-M 200) INJECTION 41%
15.0000 mL | Freq: Once | INTRAMUSCULAR | Status: AC
  Administered 2021-07-15: 15 mL via INTRATHECAL

## 2021-07-15 NOTE — Discharge Instructions (Signed)

## 2021-07-17 DIAGNOSIS — B351 Tinea unguium: Secondary | ICD-10-CM | POA: Diagnosis not present

## 2021-07-23 DIAGNOSIS — M5416 Radiculopathy, lumbar region: Secondary | ICD-10-CM | POA: Diagnosis not present

## 2021-07-29 DIAGNOSIS — L600 Ingrowing nail: Secondary | ICD-10-CM | POA: Diagnosis not present

## 2021-07-29 DIAGNOSIS — M792 Neuralgia and neuritis, unspecified: Secondary | ICD-10-CM | POA: Diagnosis not present

## 2021-08-04 DIAGNOSIS — H40013 Open angle with borderline findings, low risk, bilateral: Secondary | ICD-10-CM | POA: Diagnosis not present

## 2021-08-19 DIAGNOSIS — L600 Ingrowing nail: Secondary | ICD-10-CM | POA: Diagnosis not present

## 2021-08-19 DIAGNOSIS — M792 Neuralgia and neuritis, unspecified: Secondary | ICD-10-CM | POA: Diagnosis not present

## 2021-09-02 DIAGNOSIS — L600 Ingrowing nail: Secondary | ICD-10-CM | POA: Diagnosis not present

## 2021-09-02 DIAGNOSIS — R609 Edema, unspecified: Secondary | ICD-10-CM | POA: Diagnosis not present

## 2021-09-22 ENCOUNTER — Telehealth: Payer: Self-pay | Admitting: Family Medicine

## 2021-09-22 NOTE — Telephone Encounter (Signed)
Patient calling in with respiratory symptoms: Shortness of breath, chest pain, palpitations or other red words send to Triage  Does the patient have a fever over 100, cough, congestion, sore throat, runny nose, lost of taste/smell (please list symptoms that patient has)?Head congestion and puffiness under eyes  What date did symptoms start?09/19/21 (If over 5 days ago, pt may be scheduled for in person visit)  Have you tested for Covid in the last 5 days? Yes   If yes, was it positive OR negative [x] ? If positive in the last 5 days, please schedule virtual visit now. If negative, schedule for an in person OV with the next available provider if PCP has no openings. Please also let patient know they will be tested again (follow the script below)  "you will have to arrive 11mins prior to your appt time to be Covid tested. Please park in back of office at the [facility] & call [phone] to let the staff know you have arrived. A staff member will meet you at your car to do a rapid covid test. Once the test has resulted you will be notified by phone of your results to determine if appt will remain an in person visit or be converted to a virtual/phone visit. If you arrive less than 17mins before your appt time, your visit will be automatically converted to virtual & any recommended testing will happen AFTER the visit."   [place]  If no availability for virtual visit in office,  please schedule another [place] office  If no availability at another [place] office, please instruct patient that they can schedule an evisit or virtual visit through their mychart account. Visits up to 8pm  patients can be seen in office 5 days after positive COVID test

## 2021-09-23 ENCOUNTER — Ambulatory Visit (INDEPENDENT_AMBULATORY_CARE_PROVIDER_SITE_OTHER): Payer: Medicare Other | Admitting: Family Medicine

## 2021-09-23 ENCOUNTER — Encounter: Payer: Self-pay | Admitting: Family Medicine

## 2021-09-23 VITALS — BP 126/76 | HR 78 | Temp 97.8°F | Wt 235.0 lb

## 2021-09-23 DIAGNOSIS — R0981 Nasal congestion: Secondary | ICD-10-CM

## 2021-09-23 DIAGNOSIS — J019 Acute sinusitis, unspecified: Secondary | ICD-10-CM | POA: Diagnosis not present

## 2021-09-23 LAB — POC COVID19 BINAXNOW: SARS Coronavirus 2 Ag: NEGATIVE

## 2021-09-23 LAB — POCT INFLUENZA A/B
Influenza A, POC: NEGATIVE
Influenza B, POC: NEGATIVE

## 2021-09-23 MED ORDER — AZITHROMYCIN 250 MG PO TABS: ORAL_TABLET | ORAL | 0 refills | Status: DC

## 2021-09-23 NOTE — Progress Notes (Signed)
° °  Subjective:    Patient ID: Crystal Little, female    DOB: 03/19/1941, 80 y.o.   MRN: [id number]  HPI Here for 6 days of sinus congestion, PND, and a dry cough. No fever or SOB. No ST or NVD. Using Flonase and Delsym.    Review of Systems  Constitutional: Negative.   HENT:  Positive for congestion, postnasal drip and sinus pressure. Negative for ear pain and sore throat.   Eyes: Negative.   Respiratory:  Positive for cough. Negative for shortness of breath and wheezing.       Objective:   Physical Exam Constitutional:      Appearance: Normal appearance. She is not ill-appearing.  HENT:     Right Ear: Tympanic membrane, ear canal and external ear normal.     Left Ear: Tympanic membrane, ear canal and external ear normal.     Nose: Nose normal.     Mouth/Throat:     Pharynx: Oropharynx is clear.  Eyes:     Conjunctiva/sclera: Conjunctivae normal.  Pulmonary:     Effort: Pulmonary effort is normal.     Breath sounds: Normal breath sounds.  Lymphadenopathy:     Cervical: No cervical adenopathy.  Neurological:     Mental Status: She is alert.          Assessment & Plan:  Sinusitis, treat with a Zpack.  Alysia Penna, MD

## 2021-10-03 ENCOUNTER — Other Ambulatory Visit: Payer: Self-pay | Admitting: Family Medicine

## 2021-10-04 HISTORY — PX: BACK SURGERY: SHX140

## 2021-10-07 DIAGNOSIS — M5416 Radiculopathy, lumbar region: Secondary | ICD-10-CM | POA: Diagnosis not present

## 2021-10-07 DIAGNOSIS — M48061 Spinal stenosis, lumbar region without neurogenic claudication: Secondary | ICD-10-CM | POA: Diagnosis not present

## 2021-10-07 DIAGNOSIS — M4316 Spondylolisthesis, lumbar region: Secondary | ICD-10-CM | POA: Diagnosis not present

## 2021-10-07 DIAGNOSIS — M48062 Spinal stenosis, lumbar region with neurogenic claudication: Secondary | ICD-10-CM | POA: Diagnosis not present

## 2021-10-07 DIAGNOSIS — M5417 Radiculopathy, lumbosacral region: Secondary | ICD-10-CM | POA: Diagnosis not present

## 2021-10-07 DIAGNOSIS — M4807 Spinal stenosis, lumbosacral region: Secondary | ICD-10-CM | POA: Diagnosis not present

## 2021-10-13 ENCOUNTER — Telehealth: Payer: Self-pay | Admitting: Family Medicine

## 2021-10-13 NOTE — Telephone Encounter (Signed)
Pt having UTI symptoms, requesting prescription.

## 2021-10-13 NOTE — Telephone Encounter (Signed)
Call in Cipro 500 mg BID for 7 days  

## 2021-10-13 NOTE — Telephone Encounter (Signed)
Patient called because she is having UTI symptoms and she is unable to come in due to having back surgery this past week.Patient wants to know if something could be sent in to help.    Please send to  [id number] [address], [place] Phone:  [phone]  Fax:  [phone]         Please advise

## 2021-10-14 ENCOUNTER — Other Ambulatory Visit: Payer: Self-pay

## 2021-10-14 MED ORDER — CIPROFLOXACIN HCL 500 MG PO TABS: ORAL_TABLET | ORAL | 0 refills | Status: DC

## 2021-10-14 NOTE — Telephone Encounter (Signed)
Medication [facility] to [facility] [address].  Called patient to make aware.

## 2021-10-22 ENCOUNTER — Other Ambulatory Visit (HOSPITAL_COMMUNITY): Payer: Self-pay | Admitting: Student

## 2021-10-22 ENCOUNTER — Telehealth: Payer: Self-pay | Admitting: Family Medicine

## 2021-10-22 ENCOUNTER — Other Ambulatory Visit: Payer: Self-pay

## 2021-10-22 ENCOUNTER — Ambulatory Visit (HOSPITAL_COMMUNITY)
Admission: RE | Admit: 2021-10-22 | Discharge: 2021-10-22 | Disposition: A | Discharge status: Home / Self Care | Payer: Medicare Other | Source: Ambulatory Visit | Attending: Neurological Surgery | Admitting: Neurological Surgery

## 2021-10-22 DIAGNOSIS — R600 Localized edema: Secondary | ICD-10-CM | POA: Insufficient documentation

## 2021-10-22 NOTE — Telephone Encounter (Signed)
Please advise 

## 2021-10-22 NOTE — Telephone Encounter (Signed)
Pt had back surgery on lumbar 4-5 on 10-07-2021 and pt is experiencing leg swelling and feet swelling dr Sherley Bounds office is ordering ultrasound due to leg and feet swelling to rule out DVT. Pt is calling dr Sherley Bounds PA told her to call dr fry to see about getting something stronger than furosemide due to leg and feet swelling.   [id number] [address], [place] Phone:  [phone]  Fax:  [phone]

## 2021-10-23 ENCOUNTER — Other Ambulatory Visit: Payer: Self-pay

## 2021-10-23 DIAGNOSIS — R609 Edema, unspecified: Secondary | ICD-10-CM

## 2021-10-23 MED ORDER — TORSEMIDE 20 MG PO TABS: 20.0000 mg | ORAL_TABLET | Freq: Every day | ORAL | 5 refills | Status: DC

## 2021-10-23 NOTE — Telephone Encounter (Signed)
I see the [place] was negative for any clots. Stop the Furosemide and call in Torsemide 20 mg daily, #30 with 5 rf. Have her see me in 2 weeks

## 2021-10-23 NOTE — Telephone Encounter (Signed)
Spoke with patient, informed to stop taking Furosemide, and to began taking Torsemide 20mg .    [facility] [address] for [facility]semide [facility]mg .   Follow up appointment scheduled for 11/09/21 at 10:45

## 2021-11-02 DIAGNOSIS — M5451 Vertebrogenic low back pain: Secondary | ICD-10-CM | POA: Diagnosis not present

## 2021-11-09 ENCOUNTER — Encounter: Payer: Self-pay | Admitting: Family Medicine

## 2021-11-09 ENCOUNTER — Ambulatory Visit (INDEPENDENT_AMBULATORY_CARE_PROVIDER_SITE_OTHER): Payer: Medicare Other | Admitting: Family Medicine

## 2021-11-09 VITALS — BP 120/64 | HR 72 | Temp 97.9°F | Wt 228.0 lb

## 2021-11-09 DIAGNOSIS — R600 Localized edema: Secondary | ICD-10-CM | POA: Diagnosis not present

## 2021-11-09 DIAGNOSIS — M7989 Other specified soft tissue disorders: Secondary | ICD-10-CM | POA: Diagnosis not present

## 2021-11-09 LAB — BASIC METABOLIC PANEL
BUN: 13 mg/dL (ref 6–23)
CO2: 30 mEq/L (ref 19–32)
Calcium: 9.7 mg/dL (ref 8.4–10.5)
Chloride: 101 mEq/L (ref 96–112)
Creatinine, Ser: 0.78 mg/dL (ref 0.40–1.20)
GFR: 70.87 mL/min (ref >60.00)
Glucose, Bld: 101 mg/dL — ABNORMAL HIGH (ref 70–99)
Potassium: 4.3 mEq/L (ref 3.5–5.1)
Sodium: 138 mEq/L (ref 135–145)

## 2021-11-09 LAB — BRAIN NATRIURETIC PEPTIDE: Pro B Natriuretic peptide (BNP): 32 pg/mL (ref 0.0–100.0)

## 2021-11-09 LAB — TSH: TSH: 2.41 u[IU]/mL (ref 0.35–5.50)

## 2021-11-09 MED ORDER — FUROSEMIDE 20 MG PO TABS: 40.0000 mg | ORAL_TABLET | Freq: Two times a day (BID) | ORAL | 3 refills | Status: DC

## 2021-11-09 NOTE — Progress Notes (Signed)
° °  Subjective:    Patient ID: Crystal Little, female    DOB: [birth year], 81 y.o.   MRN: [id number]  HPI Here to discuss swelling in both of her legs that started after she had back surgery. She has had trace edema in the ankles for years, but never anything like this. They are painful, they just feel heavy. On 10-07-21 she had a lumbar discectomy at L4-5 per Dr. Sherley Bounds. The surgery went well and now she is getting PT. She had been taking 40 mg of Lasix every day until she contacted [place] last week. We then stopped the Lasix and let her try Torsemide 20 mg daily. She stopped taking this after 3 days however because she spent too much time in the bathroom. She is now back on the Lasix. She denies any chest pain or SOB. Of note she had an ECHO in 2021 that showed an EF of 65-70% with a Grade I diastolic dysfunction. She had dopplers of both legs on 10-22-21 to rule out DVT's and this was normal.    Review of Systems  Constitutional: Negative.   Respiratory: Negative.    Cardiovascular:  Positive for leg swelling. Negative for chest pain and palpitations.      Objective:   Physical Exam Constitutional:      Appearance: Normal appearance. She is not ill-appearing.     Comments: Walks with a cane   Cardiovascular:     Rate and Rhythm: Normal rate and regular rhythm.     Pulses: Normal pulses.     Heart sounds: Normal heart sounds.  Pulmonary:     Effort: Pulmonary effort is normal.     Breath sounds: Normal breath sounds.  Musculoskeletal:     Comments: Both lower legs have 3+ edema   Neurological:     Mental Status: She is alert.          Assessment & Plan:  LE edema. This has likely worsened after her surgery due to receiving IV fluids and to immobility. We will increase the Lasix to 40 mg BID for one week, then she will check back with [place]. Get a BMET, BNP, and TSH today to rule out other potential etiologies.  Alysia Penna, MD

## 2021-11-11 DIAGNOSIS — M5451 Vertebrogenic low back pain: Secondary | ICD-10-CM | POA: Diagnosis not present

## 2021-11-16 ENCOUNTER — Other Ambulatory Visit: Payer: Self-pay | Admitting: Family Medicine

## 2021-11-18 DIAGNOSIS — M5451 Vertebrogenic low back pain: Secondary | ICD-10-CM | POA: Diagnosis not present

## 2021-11-20 DIAGNOSIS — R609 Edema, unspecified: Secondary | ICD-10-CM | POA: Diagnosis not present

## 2021-11-20 DIAGNOSIS — L600 Ingrowing nail: Secondary | ICD-10-CM | POA: Diagnosis not present

## 2021-11-25 DIAGNOSIS — M5451 Vertebrogenic low back pain: Secondary | ICD-10-CM | POA: Diagnosis not present

## 2021-12-02 DIAGNOSIS — M5451 Vertebrogenic low back pain: Secondary | ICD-10-CM | POA: Diagnosis not present

## 2021-12-04 DIAGNOSIS — L600 Ingrowing nail: Secondary | ICD-10-CM | POA: Diagnosis not present

## 2021-12-04 DIAGNOSIS — R609 Edema, unspecified: Secondary | ICD-10-CM | POA: Diagnosis not present

## 2021-12-09 DIAGNOSIS — M5451 Vertebrogenic low back pain: Secondary | ICD-10-CM | POA: Diagnosis not present

## 2021-12-15 ENCOUNTER — Ambulatory Visit (INDEPENDENT_AMBULATORY_CARE_PROVIDER_SITE_OTHER): Payer: Medicare Other | Admitting: Family Medicine

## 2021-12-15 ENCOUNTER — Encounter: Payer: Self-pay | Admitting: Family Medicine

## 2021-12-15 VITALS — BP 110/62 | HR 78 | Temp 98.0°F | Wt 231.0 lb

## 2021-12-15 DIAGNOSIS — R609 Edema, unspecified: Secondary | ICD-10-CM | POA: Diagnosis not present

## 2021-12-15 MED ORDER — TORSEMIDE 20 MG PO TABS: 20.0000 mg | ORAL_TABLET | Freq: Two times a day (BID) | ORAL | 5 refills | Status: DC

## 2021-12-15 NOTE — Progress Notes (Signed)
? ?  Subjective:  ? ? Patient ID: Crystal Little, female    DOB: [birth year], 81 y.o.   MRN: [id number] ? ?HPI ?Here to follow up on leg swelling. She has been taking 40 mg of Lasix BID, but this does not help very much. She has gained 3 lbs since last month. No SOB. She has a set of compression hose at home but she cannot wear them because they are too tight.  ? ? ?Review of Systems  ?Constitutional: Negative.   ?Respiratory: Negative.    ?Cardiovascular:  Positive for leg swelling. Negative for chest pain and palpitations.  ? ?   ?Objective:  ? Physical Exam ?Constitutional:   ?   Appearance: Normal appearance. She is not ill-appearing.  ?Cardiovascular:  ?   Rate and Rhythm: Normal rate and regular rhythm.  ?   Pulses: Normal pulses.  ?   Heart sounds: Normal heart sounds.  ?Pulmonary:  ?   Effort: Pulmonary effort is normal.  ?   Breath sounds: Normal breath sounds.  ?Musculoskeletal:  ?   Comments: 4+ edema in both lower legs. No signs of cellulitis. No warmth or erythema. They are mildly tender.   ?Neurological:  ?   Mental Status: She is alert.  ? ? ? ? ? ?   ?Assessment & Plan:  ?Leg edema. We will stop lasix and try Torsemide 20 mg BID. I advised her to go to the medical supply store and get a pair of larger sized compression hose. Follow up in 3-4 weeks.  ?Alysia Penna, MD ? ? ?

## 2021-12-23 DIAGNOSIS — M5451 Vertebrogenic low back pain: Secondary | ICD-10-CM | POA: Diagnosis not present

## 2021-12-28 ENCOUNTER — Encounter: Payer: Self-pay | Admitting: Family Medicine

## 2021-12-30 DIAGNOSIS — M5451 Vertebrogenic low back pain: Secondary | ICD-10-CM | POA: Diagnosis not present

## 2021-12-31 NOTE — Telephone Encounter (Signed)
Pt was seen by Dr Lisabeth Devoid at the office on 12/24/2021, please advise ?

## 2022-01-01 ENCOUNTER — Other Ambulatory Visit: Payer: Self-pay | Admitting: Family Medicine

## 2022-01-01 ENCOUNTER — Other Ambulatory Visit: Payer: Self-pay

## 2022-01-01 MED ORDER — CEPHALEXIN 500 MG PO CAPS: ORAL_CAPSULE | ORAL | 0 refills | Status: DC

## 2022-01-01 NOTE — Telephone Encounter (Signed)
This certainly would not hurt. Call in Keflex 500 mg TID for 10 days and have her follow up with me next week  ?

## 2022-01-06 DIAGNOSIS — M5451 Vertebrogenic low back pain: Secondary | ICD-10-CM | POA: Diagnosis not present

## 2022-01-13 ENCOUNTER — Encounter: Payer: Self-pay | Admitting: Family Medicine

## 2022-01-13 ENCOUNTER — Other Ambulatory Visit (HOSPITAL_COMMUNITY): Payer: Self-pay | Admitting: Medical

## 2022-01-13 ENCOUNTER — Ambulatory Visit (HOSPITAL_COMMUNITY)
Admission: RE | Admit: 2022-01-13 | Discharge: 2022-01-13 | Disposition: A | Discharge status: Home / Self Care | Payer: Medicare Other | Source: Ambulatory Visit | Attending: Sports Medicine | Admitting: Sports Medicine

## 2022-01-13 DIAGNOSIS — M79662 Pain in left lower leg: Secondary | ICD-10-CM | POA: Diagnosis not present

## 2022-01-13 DIAGNOSIS — R600 Localized edema: Secondary | ICD-10-CM

## 2022-01-13 DIAGNOSIS — M79661 Pain in right lower leg: Secondary | ICD-10-CM | POA: Diagnosis not present

## 2022-01-13 DIAGNOSIS — M79605 Pain in left leg: Secondary | ICD-10-CM | POA: Diagnosis not present

## 2022-01-13 DIAGNOSIS — M79604 Pain in right leg: Secondary | ICD-10-CM | POA: Diagnosis not present

## 2022-01-14 NOTE — Telephone Encounter (Signed)
She can increase the Torsemide to TID until she sees me, and keep the legs elevated  ?

## 2022-01-18 ENCOUNTER — Ambulatory Visit (INDEPENDENT_AMBULATORY_CARE_PROVIDER_SITE_OTHER): Payer: Medicare Other | Admitting: Family Medicine

## 2022-01-18 ENCOUNTER — Encounter: Payer: Self-pay | Admitting: Family Medicine

## 2022-01-18 VITALS — BP 112/64 | HR 80 | Temp 98.6°F | Wt 232.0 lb

## 2022-01-18 DIAGNOSIS — R609 Edema, unspecified: Secondary | ICD-10-CM

## 2022-01-18 MED ORDER — TORSEMIDE 20 MG PO TABS: 40.0000 mg | ORAL_TABLET | Freq: Two times a day (BID) | ORAL | 2 refills | Status: DC

## 2022-01-18 NOTE — Progress Notes (Signed)
? ?  Subjective:  ? ? Patient ID: Crystal Little, female    DOB: 28-May-1941, 81 y.o.   MRN: [id number] ? ?HPI ?Here to follow up on leg edema. She still has a lot of swelling and discomfort in the legs. No SOB. To summarize, her ECHO on 09-11-19 showed an EF of 65-70% with  Grade I diastolic dysfunction. 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 Her last BMET on 11-09-21 showed a creatinine of 0.78 and a GFR of 71. She has been taking Torsemide 20 mg TID with little improvement. She has tried wearing compression stocking but she could not tolerate them.  ? ? ?Review of Systems  ?Constitutional: Negative.   ?Respiratory: Negative.    ?Cardiovascular:  Positive for leg swelling. Negative for chest pain and palpitations.  ? ?   ?Objective:  ? Physical Exam ?Constitutional:   ?   Appearance: Normal appearance.  ?Cardiovascular:  ?   Rate and Rhythm: Normal rate and regular rhythm.  ?   Pulses: Normal pulses.  ?   Heart sounds: Normal heart sounds.  ?Pulmonary:  ?   Effort: Pulmonary effort is normal.  ?   Breath sounds: Normal breath sounds.  ?Musculoskeletal:  ?   Comments: 3+ edema in both lower legs   ?Neurological:  ?   Mental Status: She is alert.  ? ? ? ? ? ?   ?Assessment & Plan:  ?Leg edema. We will go back to BID dosing of Torsemide but we wil increase the dose to 40 mg BID. She will report back in one week.  ?Alysia Penna, MD ? ? ?

## 2022-01-20 DIAGNOSIS — M5451 Vertebrogenic low back pain: Secondary | ICD-10-CM | POA: Diagnosis not present

## 2022-02-03 DIAGNOSIS — M5451 Vertebrogenic low back pain: Secondary | ICD-10-CM | POA: Diagnosis not present

## 2022-02-08 DIAGNOSIS — M5451 Vertebrogenic low back pain: Secondary | ICD-10-CM | POA: Diagnosis not present

## 2022-02-19 DIAGNOSIS — M5451 Vertebrogenic low back pain: Secondary | ICD-10-CM | POA: Diagnosis not present

## 2022-02-22 DIAGNOSIS — M5451 Vertebrogenic low back pain: Secondary | ICD-10-CM | POA: Diagnosis not present

## 2022-03-03 DIAGNOSIS — M5451 Vertebrogenic low back pain: Secondary | ICD-10-CM | POA: Diagnosis not present

## 2022-03-09 DIAGNOSIS — M79605 Pain in left leg: Secondary | ICD-10-CM | POA: Diagnosis not present

## 2022-03-09 DIAGNOSIS — I872 Venous insufficiency (chronic) (peripheral): Secondary | ICD-10-CM | POA: Diagnosis not present

## 2022-03-09 DIAGNOSIS — M79604 Pain in right leg: Secondary | ICD-10-CM | POA: Diagnosis not present

## 2022-03-09 DIAGNOSIS — R600 Localized edema: Secondary | ICD-10-CM | POA: Diagnosis not present

## 2022-03-10 DIAGNOSIS — M792 Neuralgia and neuritis, unspecified: Secondary | ICD-10-CM | POA: Diagnosis not present

## 2022-03-10 DIAGNOSIS — L603 Nail dystrophy: Secondary | ICD-10-CM | POA: Diagnosis not present

## 2022-03-10 DIAGNOSIS — L602 Onychogryphosis: Secondary | ICD-10-CM | POA: Diagnosis not present

## 2022-03-10 DIAGNOSIS — R609 Edema, unspecified: Secondary | ICD-10-CM | POA: Diagnosis not present

## 2022-03-29 ENCOUNTER — Other Ambulatory Visit: Payer: Self-pay | Admitting: Family Medicine

## 2022-03-30 DIAGNOSIS — I83893 Varicose veins of bilateral lower extremities with other complications: Secondary | ICD-10-CM | POA: Diagnosis not present

## 2022-03-30 DIAGNOSIS — R252 Cramp and spasm: Secondary | ICD-10-CM | POA: Diagnosis not present

## 2022-03-30 DIAGNOSIS — I87393 Chronic venous hypertension (idiopathic) with other complications of bilateral lower extremity: Secondary | ICD-10-CM | POA: Diagnosis not present

## 2022-03-30 DIAGNOSIS — R600 Localized edema: Secondary | ICD-10-CM | POA: Diagnosis not present

## 2022-04-12 DIAGNOSIS — D485 Neoplasm of uncertain behavior of skin: Secondary | ICD-10-CM | POA: Diagnosis not present

## 2022-04-12 DIAGNOSIS — D225 Melanocytic nevi of trunk: Secondary | ICD-10-CM | POA: Diagnosis not present

## 2022-04-12 DIAGNOSIS — C44529 Squamous cell carcinoma of skin of other part of trunk: Secondary | ICD-10-CM | POA: Diagnosis not present

## 2022-04-12 DIAGNOSIS — L821 Other seborrheic keratosis: Secondary | ICD-10-CM | POA: Diagnosis not present

## 2022-04-12 DIAGNOSIS — L814 Other melanin hyperpigmentation: Secondary | ICD-10-CM | POA: Diagnosis not present

## 2022-04-16 ENCOUNTER — Other Ambulatory Visit: Payer: Self-pay | Admitting: Family Medicine

## 2022-04-16 DIAGNOSIS — R609 Edema, unspecified: Secondary | ICD-10-CM

## 2022-04-26 DIAGNOSIS — C44529 Squamous cell carcinoma of skin of other part of trunk: Secondary | ICD-10-CM | POA: Diagnosis not present

## 2022-04-27 ENCOUNTER — Encounter: Payer: Self-pay | Admitting: Family Medicine

## 2022-04-27 ENCOUNTER — Ambulatory Visit (INDEPENDENT_AMBULATORY_CARE_PROVIDER_SITE_OTHER): Payer: Medicare Other | Admitting: Family Medicine

## 2022-04-27 VITALS — BP 118/64 | HR 68 | Temp 98.0°F | Ht 65.0 in | Wt 219.0 lb

## 2022-04-27 DIAGNOSIS — E785 Hyperlipidemia, unspecified: Secondary | ICD-10-CM

## 2022-04-27 DIAGNOSIS — M159 Polyosteoarthritis, unspecified: Secondary | ICD-10-CM

## 2022-04-27 DIAGNOSIS — R609 Edema, unspecified: Secondary | ICD-10-CM

## 2022-04-27 DIAGNOSIS — D649 Anemia, unspecified: Secondary | ICD-10-CM

## 2022-04-27 DIAGNOSIS — K219 Gastro-esophageal reflux disease without esophagitis: Secondary | ICD-10-CM

## 2022-04-27 DIAGNOSIS — R739 Hyperglycemia, unspecified: Secondary | ICD-10-CM

## 2022-04-27 LAB — HEPATIC FUNCTION PANEL
ALT: 13 U/L (ref 0–35)
AST: 20 U/L (ref 0–37)
Albumin: 4.2 g/dL (ref 3.5–5.2)
Alkaline Phosphatase: 85 U/L (ref 39–117)
Bilirubin, Direct: 0.2 mg/dL (ref 0.0–0.3)
Total Bilirubin: 0.8 mg/dL (ref 0.2–1.2)
Total Protein: 8.1 g/dL (ref 6.0–8.3)

## 2022-04-27 LAB — CBC WITH DIFFERENTIAL/PLATELET
Basophils Absolute: 0.1 10*3/uL (ref 0.0–0.1)
Basophils Relative: 1.1 % (ref 0.0–3.0)
Eosinophils Absolute: 0.1 10*3/uL (ref 0.0–0.7)
Eosinophils Relative: 1.5 % (ref 0.0–5.0)
HCT: 36.2 % (ref 36.0–46.0)
Hemoglobin: 11.9 g/dL — ABNORMAL LOW (ref 12.0–15.0)
Lymphocytes Relative: 25.1 % (ref 12.0–46.0)
Lymphs Abs: 1.8 10*3/uL (ref 0.7–4.0)
MCHC: 32.7 g/dL (ref 30.0–36.0)
MCV: 87.6 fl (ref 78.0–100.0)
Monocytes Absolute: 0.7 10*3/uL (ref 0.1–1.0)
Monocytes Relative: 9.7 % (ref 3.0–12.0)
Neutro Abs: 4.5 10*3/uL (ref 1.4–7.7)
Neutrophils Relative %: 62.6 % (ref 43.0–77.0)
Platelets: 338 10*3/uL (ref 150.0–400.0)
RBC: 4.14 Mil/uL (ref 3.87–5.11)
RDW: 15.6 % — ABNORMAL HIGH (ref 11.5–15.5)
WBC: 7.1 10*3/uL (ref 4.0–10.5)

## 2022-04-27 LAB — HEMOGLOBIN A1C: Hgb A1c MFr Bld: 6 % (ref 4.6–6.5)

## 2022-04-27 LAB — LIPID PANEL
Cholesterol: 158 mg/dL (ref 0–200)
HDL: 60.2 mg/dL (ref >39.00)
LDL Cholesterol: 76 mg/dL (ref 0–99)
NonHDL: 97.51
Total CHOL/HDL Ratio: 3
Triglycerides: 107 mg/dL (ref 0.0–149.0)
VLDL: 21.4 mg/dL (ref 0.0–40.0)

## 2022-04-27 LAB — BASIC METABOLIC PANEL
BUN: 16 mg/dL (ref 6–23)
CO2: 32 mEq/L (ref 19–32)
Calcium: 10.2 mg/dL (ref 8.4–10.5)
Chloride: 99 mEq/L (ref 96–112)
Creatinine, Ser: 0.9 mg/dL (ref 0.40–1.20)
GFR: 59.49 mL/min — ABNORMAL LOW (ref >60.00)
Glucose, Bld: 102 mg/dL — ABNORMAL HIGH (ref 70–99)
Potassium: 4.6 mEq/L (ref 3.5–5.1)
Sodium: 138 mEq/L (ref 135–145)

## 2022-04-27 LAB — TSH: TSH: 2.42 u[IU]/mL (ref 0.35–5.50)

## 2022-04-27 NOTE — Progress Notes (Signed)
Subjective:    Patient ID: Crystal Little, female    DOB: [birth year], 81 y.o.   MRN: [id number]  HPI Here to follow up on issues. Since she was changed from Furosemide to Torsemide, the ankle edema has been much better controlled. Her BP is stable. 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 Last week she flew to [place] to visit her son and his wife. Her OA is always a challenge but Tylenol helps.    Review of Systems  Constitutional: Negative.   HENT: Negative.    Eyes: Negative.   Respiratory: Negative.    Cardiovascular: Negative.   Gastrointestinal: Negative.   Genitourinary:  Negative for decreased urine volume, difficulty urinating, dyspareunia, dysuria, enuresis, flank pain, frequency, hematuria, pelvic pain and urgency.  Musculoskeletal:  Positive for arthralgias.  Skin: Negative.   Neurological: Negative.  Negative for headaches.  Psychiatric/Behavioral: Negative.         Objective:   Physical Exam Constitutional:      General: She is not in acute distress.    Appearance: She is well-developed.     Comments: Walks with a cane  HENT:     Head: Normocephalic and atraumatic.     Right Ear: External ear normal.     Left Ear: External ear normal.     Nose: Nose normal.     Mouth/Throat:     Pharynx: No oropharyngeal exudate.  Eyes:     General: No scleral icterus.    Conjunctiva/sclera: Conjunctivae normal.     Pupils: Pupils are equal, round, and reactive to light.  Neck:     Thyroid: No thyromegaly.     Vascular: No JVD.  Cardiovascular:     Rate and Rhythm: Normal rate and regular rhythm.     Heart sounds: Normal heart sounds. No murmur heard.    No friction rub. No gallop.  Pulmonary:     Effort: Pulmonary effort is normal. No respiratory distress.     Breath sounds: Normal breath sounds. No wheezing or rales.  Chest:     Chest wall: No tenderness.  Abdominal:     General: Bowel sounds are normal. There is no  distension.     Palpations: Abdomen is soft. There is no mass.     Tenderness: There is no abdominal tenderness. There is no guarding or rebound.  Musculoskeletal:        General: No tenderness. Normal range of motion.     Cervical back: Normal range of motion and neck supple.     Right lower leg: No edema.     Left lower leg: No edema.  Lymphadenopathy:     Cervical: No cervical adenopathy.  Skin:    General: Skin is warm and dry.     Findings: No erythema or rash.  Neurological:     Mental Status: She is alert and oriented to person, place, and time.     Cranial Nerves: No cranial nerve deficit.     Motor: No abnormal muscle tone.     Coordination: Coordination normal.     Deep Tendon Reflexes: Reflexes are normal and symmetric. Reflexes normal.  Psychiatric:        Behavior: Behavior normal.        Thought Content: Thought content normal.        Judgment: Judgment normal.           Assessment & Plan:  Her ankle edema is well controlled.  Her BP is stable. Her OA is managed with Tylenol.  Her GERD is stable. We will get fasting labs to check lipids, A1c, etc. We spent a total of ( 34  ) minutes reviewing records and discussing these issues.  Alysia Penna, MD

## 2022-04-29 ENCOUNTER — Other Ambulatory Visit: Payer: Self-pay | Admitting: Family Medicine

## 2022-04-29 DIAGNOSIS — R609 Edema, unspecified: Secondary | ICD-10-CM

## 2022-05-03 DIAGNOSIS — I83891 Varicose veins of right lower extremities with other complications: Secondary | ICD-10-CM | POA: Diagnosis not present

## 2022-05-06 DIAGNOSIS — Z09 Encounter for follow-up examination after completed treatment for conditions other than malignant neoplasm: Secondary | ICD-10-CM | POA: Diagnosis not present

## 2022-05-06 DIAGNOSIS — I83811 Varicose veins of right lower extremities with pain: Secondary | ICD-10-CM | POA: Diagnosis not present

## 2022-05-18 DIAGNOSIS — I872 Venous insufficiency (chronic) (peripheral): Secondary | ICD-10-CM | POA: Diagnosis not present

## 2022-05-21 DIAGNOSIS — I87393 Chronic venous hypertension (idiopathic) with other complications of bilateral lower extremity: Secondary | ICD-10-CM | POA: Diagnosis not present

## 2022-05-21 DIAGNOSIS — Z09 Encounter for follow-up examination after completed treatment for conditions other than malignant neoplasm: Secondary | ICD-10-CM | POA: Diagnosis not present

## 2022-05-21 DIAGNOSIS — I83891 Varicose veins of right lower extremities with other complications: Secondary | ICD-10-CM | POA: Diagnosis not present

## 2022-05-26 DIAGNOSIS — H43813 Vitreous degeneration, bilateral: Secondary | ICD-10-CM | POA: Diagnosis not present

## 2022-05-26 DIAGNOSIS — H26491 Other secondary cataract, right eye: Secondary | ICD-10-CM | POA: Diagnosis not present

## 2022-05-26 DIAGNOSIS — H524 Presbyopia: Secondary | ICD-10-CM | POA: Diagnosis not present

## 2022-05-26 DIAGNOSIS — H04123 Dry eye syndrome of bilateral lacrimal glands: Secondary | ICD-10-CM | POA: Diagnosis not present

## 2022-05-26 DIAGNOSIS — H40013 Open angle with borderline findings, low risk, bilateral: Secondary | ICD-10-CM | POA: Diagnosis not present

## 2022-06-08 ENCOUNTER — Ambulatory Visit (INDEPENDENT_AMBULATORY_CARE_PROVIDER_SITE_OTHER): Payer: Medicare Other

## 2022-06-08 VITALS — Ht 66.0 in | Wt 219.0 lb

## 2022-06-08 DIAGNOSIS — Z0000 Encounter for general adult medical examination without abnormal findings: Secondary | ICD-10-CM

## 2022-06-08 NOTE — Patient Instructions (Addendum)
Crystal Little , Thank you for taking time to come for your Medicare Wellness Visit. I appreciate your ongoing commitment to your health goals. Please review the following plan we discussed and let me know if I can assist you in the future.   Screening recommendations/referrals: Colonoscopy: No longer required Mammogram: No longer required Bone Density:  Recommended yearly ophthalmology/optometry visit for glaucoma screening and checkup Recommended yearly dental visit for hygiene and checkup  Vaccinations: Influenza vaccine: Up to date Pneumococcal vaccine: Up to date Tdap vaccine: Done Shingles vaccine: Done   Covid-19:Done  Advanced directives: Please bring a copy of your health care power of attorney and living will to the office to be added to your chart at your convenience.   Conditions/risks identified: None  Next appointment: Follow up in one year for your annual wellness visit     Preventive Care 65 Years and Older, Female Preventive care refers to lifestyle choices and visits with your health care provider that can promote health and wellness. What does preventive care include? A yearly physical exam. This is also called an annual well check. Dental exams once or twice a year. Routine eye exams. Ask your health care provider how often you should have your eyes checked. Personal lifestyle choices, including: Daily care of your teeth and gums. Regular physical activity. Eating a healthy diet. Avoiding tobacco and drug use. Limiting alcohol use. Practicing safe sex. Taking low-dose aspirin every day. Taking vitamin and mineral supplements as recommended by your health care provider. What happens during an annual well check? The services and screenings done by your health care provider during your annual well check will depend on your age, overall health, lifestyle risk factors, and family history of disease. Counseling  Your health care provider may ask you questions  about your: Alcohol use. Tobacco use. Drug use. Emotional well-being. Home and relationship well-being. Sexual activity. Eating habits. History of falls. Memory and ability to understand (cognition). Work and work Statistician. Reproductive health. Screening  You may have the following tests or measurements: Height, weight, and BMI. Blood pressure. Lipid and cholesterol levels. These may be checked every 5 years, or more frequently if you are over 51 years old. Skin check. Lung cancer screening. You may have this screening every year starting at age 1 if you have a 30-pack-year history of smoking and currently smoke or have quit within the past 15 years. Fecal occult blood test (FOBT) of the stool. You may have this test every year starting at age 51. Flexible sigmoidoscopy or colonoscopy. You may have a sigmoidoscopy every 5 years or a colonoscopy every 10 years starting at age 70. Hepatitis C blood test. Hepatitis B blood test. Sexually transmitted disease (STD) testing. Diabetes screening. This is done by checking your blood sugar (glucose) after you have not eaten for a while (fasting). You may have this done every 1-3 years. Bone density scan. This is done to screen for osteoporosis. You may have this done starting at age 32. Mammogram. This may be done every 1-2 years. Talk to your health care provider about how often you should have regular mammograms. Talk with your health care provider about your test results, treatment options, and if necessary, the need for more tests. Vaccines  Your health care provider may recommend certain vaccines, such as: Influenza vaccine. This is recommended every year. Tetanus, diphtheria, and acellular pertussis (Tdap, Td) vaccine. You may need a Td booster every 10 years. Zoster vaccine. You may need this after age 27.  Pneumococcal 13-valent conjugate (PCV13) vaccine. One dose is recommended after age 28. Pneumococcal polysaccharide (PPSV23)  vaccine. One dose is recommended after age 28. Talk to your health care provider about which screenings and vaccines you need and how often you need them. This information is not intended to replace advice given to you by your health care provider. Make sure you discuss any questions you have with your health care provider. Document Released: 10/17/2015 Document Revised: 06/09/2016 Document Reviewed: 07/22/2015 Elsevier Interactive Patient Education  2017 [place] Prevention in the Home Falls can cause injuries. They can happen to people of all ages. There are many things you can do to make your home safe and to help prevent falls. What can I do on the outside of my home? Regularly fix the edges of walkways and driveways and fix any cracks. Remove anything that might make you trip as you walk through a door, such as a raised step or threshold. Trim any bushes or trees on the path to your home. Use bright outdoor lighting. Clear any walking paths of anything that might make someone trip, such as rocks or tools. Regularly check to see if handrails are loose or broken. Make sure that both sides of any steps have handrails. Any raised decks and porches should have guardrails on the edges. Have any leaves, snow, or ice cleared regularly. Use sand or salt on walking paths during winter. Clean up any spills in your garage right away. This includes oil or grease spills. What can I do in the bathroom? Use night lights. Install grab bars by the toilet and in the tub and shower. Do not use towel bars as grab bars. Use non-skid mats or decals in the tub or shower. If you need to sit down in the shower, use a plastic, non-slip stool. Keep the floor dry. Clean up any water that spills on the floor as soon as it happens. Remove soap buildup in the tub or shower regularly. Attach bath mats securely with double-sided non-slip rug tape. Do not have throw rugs and other things on the floor that  can make you trip. What can I do in the bedroom? Use night lights. Make sure that you have a light by your bed that is easy to reach. Do not use any sheets or blankets that are too big for your bed. They should not hang down onto the floor. Have a firm chair that has side arms. You can use this for support while you get dressed. Do not have throw rugs and other things on the floor that can make you trip. What can I do in the kitchen? Clean up any spills right away. Avoid walking on wet floors. Keep items that you use a lot in easy-to-reach places. If you need to reach something above you, use a strong step stool that has a grab bar. Keep electrical cords out of the way. Do not use floor polish or wax that makes floors slippery. If you must use wax, use non-skid floor wax. Do not have throw rugs and other things on the floor that can make you trip. What can I do with my stairs? Do not leave any items on the stairs. Make sure that there are handrails on both sides of the stairs and use them. Fix handrails that are broken or loose. Make sure that handrails are as long as the stairways. Check any carpeting to make sure that it is firmly attached to the stairs. Fix any  carpet that is loose or worn. Avoid having throw rugs at the top or bottom of the stairs. If you do have throw rugs, attach them to the floor with carpet tape. Make sure that you have a light switch at the top of the stairs and the bottom of the stairs. If you do not have them, ask someone to add them for you. What else can I do to help prevent falls? Wear shoes that: Do not have high heels. Have rubber bottoms. Are comfortable and fit you well. Are closed at the toe. Do not wear sandals. If you use a stepladder: Make sure that it is fully opened. Do not climb a closed stepladder. Make sure that both sides of the stepladder are locked into place. Ask someone to hold it for you, if possible. Clearly mark and make sure that you  can see: Any grab bars or handrails. First and last steps. Where the edge of each step is. Use tools that help you move around (mobility aids) if they are needed. These include: Canes. Walkers. Scooters. Crutches. Turn on the lights when you go into a dark area. Replace any light bulbs as soon as they burn out. Set up your furniture so you have a clear path. Avoid moving your furniture around. If any of your floors are uneven, fix them. If there are any pets around you, be aware of where they are. Review your medicines with your doctor. Some medicines can make you feel dizzy. This can increase your chance of falling. Ask your doctor what other things that you can do to help prevent falls. This information is not intended to replace advice given to you by your health care provider. Make sure you discuss any questions you have with your health care provider. Document Released: 07/17/2009 Document Revised: 02/26/2016 Document Reviewed: 10/25/2014 Elsevier Interactive Patient Education  2017 [employer].

## 2022-06-08 NOTE — Progress Notes (Signed)
Subjective:   Crystal Little is a 81 y.o. female who presents for Medicare Annual (Subsequent) preventive examination.  Review of Systems    Virtual Visit via Telephone Note  I connected with  Crystal Little on 06/08/22 at 11:00 AM EDT by telephone and verified that I am speaking with the correct person using two identifiers.  Location: Patient: Home Provider: Offic Persons participating in the virtual visit: patient/Nurse Health Advisor   I discussed the limitations, risks, security and privacy concerns of performing an evaluation and management service by telephone and the availability of in person appointments. The patient expressed understanding and agreed to proceed.  Interactive audio and video telecommunications were attempted between this nurse and patient, however failed, due to patient having technical difficulties OR patient did not have access to video capability.  We continued and completed visit with audio only.  Some vital signs may be absent or patient reported.   Crystal Peaches, LPN  Cardiac Risk Factors include: advanced age (>60mn, >>67women)     Objective:    Today's Vitals   06/08/22 1045  Weight: 219 lb (99.3 kg)  Height: '5\' 6"'  (1.676 m)   Body mass index is 35.35 kg/m.     06/08/2022   11:00 AM 06/03/2021    3:14 PM 04/21/2020    3:35 PM 10/12/2017    9:33 AM 07/04/2017    2:48 PM 02/18/2015    2:37 PM 01/03/2012    3:50 PM  Advanced Directives  Does Patient Have a Medical Advance Directive? Yes Yes Yes Yes Yes Yes Patient has advance directive, copy not in chart  Type of Advance Directive [place] will [place] will [place] AF[place]iving will  [place] will    Does patient want to make changes to medical advance directive? No - Patient declined        Copy of [place] Chart? No - copy requested No - copy requested Yes -  validated most recent copy scanned in chart (See row information)  No - copy requested No - copy requested     Current Medications (verified) Outpatient Encounter Medications as of 06/08/2022  Medication Sig   Cholecalciferol (VITAMIN D) 2000 UNITS CAPS Take by mouth.   omeprazole (PRILOSEC) 40 MG capsule TAKE 1 CAPSULE BY MOUTH EVERY DAY   torsemide (DEMADEX) 20 MG tablet TAKE 2 TABLETS BY MOUTH TWICE A DAY   Zoster Vaccine Adjuvanted (SHINGRIX) injection Shingrix (PF) 50 mcg/0.5 mL intramuscular suspension, kit  TO BE ADMINISTERED BY PHARMACIST FOR IMMUNIZATION   No facility-administered encounter medications on file as of 06/08/2022.    Allergies (verified) Amoxicillin   History: Past Medical History:  Diagnosis Date   Diverticulosis    Frequent UTI    sees Dr. DRosana Hoes  GERD (gastroesophageal reflux disease)    Gynecological examination    sees Dr. DSander Radon  IBS (irritable bowel syndrome)    Leg edema    Osteoarthritis    Status post dilation of esophageal narrowing    [facility]    [facility]   Past Surgical History:  Procedure Laterality Date   BACK SURGERY     COLONOSCOPY  04/26/2011   per Dr. PSharlett Iles diverticulosis only, no repeats needed   endovascular repair of bilateral leg varicosities     per Dr. GJones Skene   ESOPHAGOGASTRODUODENOSCOPY  01/19/2010   per Dr. PSharlett Iles  with dilatation    ESOPHAGOGASTRODUODENOSCOPY (EGD) WITH ESOPHAGEAL DILATION     right knee arthroscopy  12/17/05 and 01-06-12   per Dr. Percell Miller   TONSILLECTOMY     Family History  Problem Relation Age of Onset   Irritable bowel syndrome Mother    Pancreatic cancer Paternal Grandmother    Diabetes Maternal Aunt    Diabetes Maternal Uncle    Heart disease Paternal Grandfather    Colon cancer Neg Hx    Colon polyps Neg Hx    Kidney disease Neg Hx    Social History   Socioeconomic History   Marital status: Married    Spouse name: Not on file   Number of  children: 3   Years of education: Not on file   Highest education level: Not on file  Occupational History   Occupation: [employer] photographer  Tobacco Use   Smoking status: Never   Smokeless tobacco: Never  Substance and Sexual Activity   Alcohol use: Yes    Alcohol/week: 2.0 standard drinks of alcohol    Types: 2 Glasses of wine per week    Comment: occ   Drug use: No   Sexual activity: Not on file  Other Topics Concern   Not on file  Social History Narrative   Not on file   Social Determinants of Health   Financial Resource Strain: Low Risk  (06/08/2022)   Overall Financial Resource Strain (CARDIA)    Difficulty of Paying Living Expenses: Not hard at all  Food Insecurity: No Food Insecurity (06/08/2022)   Hunger Vital Sign    Worried About Running Out of Food in the Last Year: Never true    Ran Out of Food in the Last Year: Never true  Transportation Needs: No Transportation Needs (06/08/2022)   PRAPARE - Hydrologist (Medical): No    Lack of Transportation (Non-Medical): No  Physical Activity: Inactive (06/08/2022)   Exercise Vital Sign    Days of Exercise per Week: 0 days    Minutes of Exercise per Session: 0 min  Stress: No Stress Concern Present (06/08/2022)   [place]    Feeling of Stress : Not at all  Social Connections: Moderately Integrated (06/08/2022)   Social Connection and Isolation Panel [NHANES]    Frequency of Communication with Friends and Family: More than three times a week    Frequency of Social Gatherings with Friends and Family: More than three times a week    Attends Religious Services: More than 4 times per year    Active Member of [employer] or Organizations: Yes    Attends Archivist Meetings: More than 4 times per year    Marital Status: Widowed    Tobacco Counseling Counseling given: Not Answered   Clinical Intake:  Pre-visit preparation completed:  No  Pain : No/denies pain     BMI - recorded: 35.35 Nutritional Status: BMI > 30  Obese Nutritional Risks: None Diabetes: No  How often do you need to have someone help you when you read instructions, pamphlets, or other written materials from your doctor or pharmacy?: 1 - Never  Diabetic? No  Interpreter Needed?: No  Information entered by :: Rolene Arbour LPN   Activities of Daily Living    06/08/2022   10:56 AM  In your present state of health, do you have any difficulty performing the following activities:  Hearing? 0  Vision? 0  Difficulty concentrating or  making decisions? 0  Walking or climbing stairs? 0  Dressing or bathing? 0  Doing errands, shopping? 0  Preparing Food and eating ? N  Using the Toilet? N  In the past six months, have you accidently leaked urine? N  Do you have problems with loss of bowel control? N  Managing your Medications? N  Managing your Finances? N  Housekeeping or managing your Housekeeping? N    Patient Care Team: Laurey Morale, MD as PCP - General  Indicate any recent Medical Services you may have received from other than [facility] providers in the past year (date may be approximate).     Assessment:   This is a routine wellness examination for Crystal Little.  Hearing/Vision screen Hearing Screening - Comments:: Denies hearing difficulties   Vision Screening - Comments:: Wears rx glasses - up to date with routine eye exams with  Dr Satira Sark  Dietary issues and exercise activities discussed: Current Exercise Habits: The patient does not participate in regular exercise at present, Exercise limited by: None identified   Goals Addressed               This Visit's Progress     Patient stated (pt-stated)        I want to be able to walk without pain in my legs.       Depression Screen    06/08/2022   10:54 AM 06/08/2022   10:53 AM 04/27/2022    9:37 AM 01/18/2022    8:42 AM 12/15/2021    2:31 PM 11/09/2021   11:10 AM 09/23/2021     4:24 PM  PHQ 2/9 Scores  PHQ - 2 Score 0 0 1 1 0 0 4  PHQ- 9 Score 0 0 '6 5 5 4 10    ' Fall Risk    06/08/2022   10:58 AM 04/27/2022    9:36 AM 01/18/2022    8:42 AM 12/15/2021    2:32 PM 11/09/2021   11:10 AM  Fall Risk   Falls in the past year? 0 0 0 0 0  Number falls in past yr: 0 0 0 0 0  Injury with Fall? 0 0 0 0 0  Risk for fall due to : No Fall Risks No Fall Risks No Fall Risks No Fall Risks No Fall Risks  Follow up Falls prevention discussed Falls evaluation completed Falls evaluation completed      FALL RISK PREVENTION PERTAINING TO THE HOME:  Any stairs in or around the home? Yes  If so, are there any without handrails? No  Home free of loose throw rugs in walkways, pet beds, electrical cords, etc? Yes  Adequate lighting in your home to reduce risk of falls? Yes   ASSISTIVE DEVICES UTILIZED TO PREVENT FALLS:  Life alert? No  Use of a cane, walker or w/c? Yes  Grab bars in the bathroom? Yes  Shower chair or bench in shower? Yes  Elevated toilet seat or a handicapped toilet? Yes   TIMED UP AND GO:  Was the test performed? No . Audio Visit  Cognitive Function:        06/08/2022   11:00 AM 04/21/2020    3:40 PM  6CIT Screen  What Year? 0 points 0 points  What month? 0 points 0 points  What time? 0 points 0 points  Count back from 20 0 points 0 points  Months in reverse 0 points 0 points  Repeat phrase 0 points 0 points  Total Score 0 points  0 points    Immunizations Immunization History  Administered Date(s) Administered   Influenza, High Dose Seasonal PF 07/31/2015, 07/06/2016, 06/07/2018, 05/24/2019   Influenza,inj,Quad PF,6+ Mos 06/20/2013, 06/24/2014   Influenza,inj,quad, With Preservative 07/04/2017   Influenza-Unspecified 07/11/2017, 06/04/2020   PFIZER(Purple Top)SARS-COV-2 Vaccination 10/19/2019, 11/09/2019   Pneumococcal Conjugate-13 06/24/2014   Pneumococcal Polysaccharide-23 10/09/2009   Td 10/09/2009, 12/31/2019   Zoster Recombinat (Shingrix)  04/24/2018, 07/25/2018   Zoster, Live 08/26/2012    TDAP status: Up to date  Flu Vaccine status: Up to date  Pneumococcal vaccine status: Up to date  Covid-19 vaccine status: Completed vaccines  Qualifies for Shingles Vaccine? Yes   Zostavax completed Yes   Shingrix Completed?: Yes  Screening Tests Health Maintenance  Topic Date Due   TETANUS/TDAP  12/30/2029   Pneumonia Vaccine 87+ Years old  Completed   INFLUENZA VACCINE  Completed   DEXA SCAN  Completed   COVID-19 Vaccine  Completed   Zoster Vaccines- Shingrix  Completed   HPV VACCINES  Aged Out    Health Maintenance  There are no preventive care reminders to display for this patient.  Colorectal cancer screening: No longer required.   Mammogram status: No longer required due to Age.   Lung Cancer Screening: (Low Dose CT Chest recommended if Age 37-80 years, 30 pack-year currently smoking OR have quit w/in 15years.) does not qualify.   Additional Screening:  Hepatitis C Screening: does not qualify; Completed Patient stated  Vision Screening: Recommended annual ophthalmology exams for early detection of glaucoma and other disorders of the eye. Is the patient up to date with their annual eye exam?  Yes  Who is the provider or what is the name of the office in which the patient attends annual eye exams? Dr Satira Sark If pt is not established with a provider, would they like to be referred to a provider to establish care? No .   Dental Screening: Recommended annual dental exams for proper oral hygiene  Community Resource Referral / Chronic Care Management:  CRR required this visit?  No   CCM required this visit?  No      Plan:     I have personally reviewed and noted the following in the patient's chart:   Medical and social history Use of alcohol, tobacco or illicit drugs  Current medications and supplements including opioid prescriptions. Patient is not currently taking opioid prescriptions. Functional  ability and status Nutritional status Physical activity Advanced directives List of other physicians Hospitalizations, surgeries, and ER visits in previous 12 months Vitals Screenings to include cognitive, depression, and falls Referrals and appointments  In addition, I have reviewed and discussed with patient certain preventive protocols, quality metrics, and best practice recommendations. A written personalized care plan for preventive services as well as general preventive health recommendations were provided to patient.     Crystal Peaches, LPN   10/08/8725   Nurse Notes: None

## 2022-06-09 DIAGNOSIS — M792 Neuralgia and neuritis, unspecified: Secondary | ICD-10-CM | POA: Diagnosis not present

## 2022-06-09 DIAGNOSIS — R609 Edema, unspecified: Secondary | ICD-10-CM | POA: Diagnosis not present

## 2022-06-09 DIAGNOSIS — L602 Onychogryphosis: Secondary | ICD-10-CM | POA: Diagnosis not present

## 2022-06-15 DIAGNOSIS — I83892 Varicose veins of left lower extremities with other complications: Secondary | ICD-10-CM | POA: Diagnosis not present

## 2022-06-21 ENCOUNTER — Telehealth: Payer: Self-pay | Admitting: Family Medicine

## 2022-06-21 NOTE — Telephone Encounter (Signed)
Pt tested positive for Covid and is asking that MD prescribe something to treat Covid symptoms.  LOV:  04/27/22 (CPE)  Please advise.  [id number] [place] [place]  [phone] Fax:  [phone]

## 2022-06-22 ENCOUNTER — Telehealth (INDEPENDENT_AMBULATORY_CARE_PROVIDER_SITE_OTHER): Payer: Medicare Other | Admitting: Family Medicine

## 2022-06-22 ENCOUNTER — Other Ambulatory Visit: Payer: Self-pay

## 2022-06-22 ENCOUNTER — Encounter: Payer: Self-pay | Admitting: Family Medicine

## 2022-06-22 DIAGNOSIS — U071 COVID-19: Secondary | ICD-10-CM | POA: Diagnosis not present

## 2022-06-22 MED ORDER — NIRMATRELVIR/RITONAVIR (PAXLOVID)TABLET: 3.0000 | ORAL_TABLET | Freq: Two times a day (BID) | ORAL | 0 refills | Status: AC

## 2022-06-22 MED ORDER — OMEPRAZOLE 40 MG PO CPDR: 40.0000 mg | DELAYED_RELEASE_CAPSULE | Freq: Every day | ORAL | 0 refills | Status: DC

## 2022-06-22 NOTE — Progress Notes (Signed)
   Subjective:    Patient ID: Crystal Little, female    DOB: [birth year], 81 y.o.   MRN: [id number]  HPI Virtual Visit via Telephone Note  I connected with the patient on 06/22/22 at  8:45 AM EDT by telephone and verified that I am speaking with the correct person using two identifiers.   I discussed the limitations, risks, security and privacy concerns of performing an evaluation and management service by telephone and the availability of in person appointments. I also discussed with the patient that there may be a patient responsible charge related to this service. The patient expressed understanding and agreed to proceed.  Location patient: home Location provider: work or home office Participants present for the call: patient, provider Patient did not have a visit in the prior 7 days to address this/these issue(s).   History of Present Illness: Here for a Covid infection. About 3 days ago she developed a dry cough, head congestion, and muscle aches in the legs. No fever or SOB. No NVD. Then 2 days ago she tested positive for the Covid virus. She is drinking fluids and taking Delsym.    Observations/Objective: Patient sounds cheerful and well on the phone. I do not appreciate any SOB. Speech and thought processing are grossly intact. Patient reported vitals:  Assessment and Plan: Covid infection, treat with 5 days of Paxlovid.  Alysia Penna, MD   Follow Up Instructions:     [phone] 5-10 [phone] 11-20 [id number] 21-30 I did not refer this patient for an OV in the next 24 hours for this/these issue(s).  I discussed the assessment and treatment plan with the patient. The patient was provided an opportunity to ask questions and all were answered. The patient agreed with the plan and demonstrated an understanding of the instructions.   The patient was advised to call back or seek an in-person evaluation if the symptoms worsen or if the condition fails to improve as anticipated.  I  provided 13 minutes of non-face-to-face time during this encounter.   Alysia Penna, MD     Review of Systems     Objective:   Physical Exam        Assessment & Plan:

## 2022-06-22 NOTE — Telephone Encounter (Signed)
She is scheduled for a virtual OV today

## 2022-06-29 DIAGNOSIS — M7989 Other specified soft tissue disorders: Secondary | ICD-10-CM | POA: Diagnosis not present

## 2022-06-29 DIAGNOSIS — I83811 Varicose veins of right lower extremities with pain: Secondary | ICD-10-CM | POA: Diagnosis not present

## 2022-06-29 DIAGNOSIS — I83891 Varicose veins of right lower extremities with other complications: Secondary | ICD-10-CM | POA: Diagnosis not present

## 2022-07-06 DIAGNOSIS — I83892 Varicose veins of left lower extremities with other complications: Secondary | ICD-10-CM | POA: Diagnosis not present

## 2022-07-14 ENCOUNTER — Encounter: Payer: Self-pay | Admitting: Family Medicine

## 2022-07-14 NOTE — Telephone Encounter (Signed)
She should use whichever one works the best on the fluid. For the cramps, she can take 1 or 2 tablets of 400 mg magnesium (OTC) every day

## 2022-07-15 DIAGNOSIS — Z1231 Encounter for screening mammogram for malignant neoplasm of breast: Secondary | ICD-10-CM | POA: Diagnosis not present

## 2022-07-20 DIAGNOSIS — I87391 Chronic venous hypertension (idiopathic) with other complications of right lower extremity: Secondary | ICD-10-CM | POA: Diagnosis not present

## 2022-07-20 DIAGNOSIS — I83891 Varicose veins of right lower extremities with other complications: Secondary | ICD-10-CM | POA: Diagnosis not present

## 2022-07-29 DIAGNOSIS — M7989 Other specified soft tissue disorders: Secondary | ICD-10-CM | POA: Diagnosis not present

## 2022-07-29 DIAGNOSIS — I83892 Varicose veins of left lower extremities with other complications: Secondary | ICD-10-CM | POA: Diagnosis not present

## 2022-07-29 DIAGNOSIS — I83812 Varicose veins of left lower extremities with pain: Secondary | ICD-10-CM | POA: Diagnosis not present

## 2022-08-10 DIAGNOSIS — I83811 Varicose veins of right lower extremities with pain: Secondary | ICD-10-CM | POA: Diagnosis not present

## 2022-08-10 DIAGNOSIS — M7989 Other specified soft tissue disorders: Secondary | ICD-10-CM | POA: Diagnosis not present

## 2022-08-10 DIAGNOSIS — I83891 Varicose veins of right lower extremities with other complications: Secondary | ICD-10-CM | POA: Diagnosis not present

## 2022-08-12 DIAGNOSIS — L578 Other skin changes due to chronic exposure to nonionizing radiation: Secondary | ICD-10-CM | POA: Diagnosis not present

## 2022-08-12 DIAGNOSIS — Z85828 Personal history of other malignant neoplasm of skin: Secondary | ICD-10-CM | POA: Diagnosis not present

## 2022-08-12 DIAGNOSIS — L905 Scar conditions and fibrosis of skin: Secondary | ICD-10-CM | POA: Diagnosis not present

## 2022-08-12 DIAGNOSIS — Z08 Encounter for follow-up examination after completed treatment for malignant neoplasm: Secondary | ICD-10-CM | POA: Diagnosis not present

## 2022-09-10 ENCOUNTER — Encounter: Payer: Self-pay | Admitting: Family Medicine

## 2022-09-10 NOTE — Telephone Encounter (Signed)
Absolutely! This would be good for her

## 2022-11-03 DIAGNOSIS — I890 Lymphedema, not elsewhere classified: Secondary | ICD-10-CM | POA: Diagnosis not present

## 2022-11-25 DIAGNOSIS — R252 Cramp and spasm: Secondary | ICD-10-CM | POA: Diagnosis not present

## 2022-11-25 DIAGNOSIS — I83893 Varicose veins of bilateral lower extremities with other complications: Secondary | ICD-10-CM | POA: Diagnosis not present

## 2022-11-25 DIAGNOSIS — R600 Localized edema: Secondary | ICD-10-CM | POA: Diagnosis not present

## 2022-11-25 DIAGNOSIS — I890 Lymphedema, not elsewhere classified: Secondary | ICD-10-CM | POA: Diagnosis not present

## 2022-12-09 DIAGNOSIS — I83891 Varicose veins of right lower extremities with other complications: Secondary | ICD-10-CM | POA: Diagnosis not present

## 2022-12-20 DIAGNOSIS — I83892 Varicose veins of left lower extremities with other complications: Secondary | ICD-10-CM | POA: Diagnosis not present

## 2022-12-26 ENCOUNTER — Other Ambulatory Visit: Payer: Self-pay | Admitting: Family Medicine

## 2022-12-27 DIAGNOSIS — I83891 Varicose veins of right lower extremities with other complications: Secondary | ICD-10-CM | POA: Diagnosis not present

## 2022-12-27 DIAGNOSIS — I83811 Varicose veins of right lower extremities with pain: Secondary | ICD-10-CM | POA: Diagnosis not present

## 2022-12-27 DIAGNOSIS — M7989 Other specified soft tissue disorders: Secondary | ICD-10-CM | POA: Diagnosis not present

## 2023-01-17 ENCOUNTER — Other Ambulatory Visit: Payer: Self-pay | Admitting: Family Medicine

## 2023-01-17 DIAGNOSIS — R609 Edema, unspecified: Secondary | ICD-10-CM

## 2023-01-18 DIAGNOSIS — I87391 Chronic venous hypertension (idiopathic) with other complications of right lower extremity: Secondary | ICD-10-CM | POA: Diagnosis not present

## 2023-01-18 DIAGNOSIS — I83891 Varicose veins of right lower extremities with other complications: Secondary | ICD-10-CM | POA: Diagnosis not present

## 2023-01-21 DIAGNOSIS — M25562 Pain in left knee: Secondary | ICD-10-CM | POA: Diagnosis not present

## 2023-01-21 DIAGNOSIS — M170 Bilateral primary osteoarthritis of knee: Secondary | ICD-10-CM | POA: Diagnosis not present

## 2023-01-21 DIAGNOSIS — M25561 Pain in right knee: Secondary | ICD-10-CM | POA: Diagnosis not present

## 2023-01-25 DIAGNOSIS — I83892 Varicose veins of left lower extremities with other complications: Secondary | ICD-10-CM | POA: Diagnosis not present

## 2023-01-27 DIAGNOSIS — M170 Bilateral primary osteoarthritis of knee: Secondary | ICD-10-CM | POA: Diagnosis not present

## 2023-02-03 DIAGNOSIS — M170 Bilateral primary osteoarthritis of knee: Secondary | ICD-10-CM | POA: Diagnosis not present

## 2023-02-10 DIAGNOSIS — M170 Bilateral primary osteoarthritis of knee: Secondary | ICD-10-CM | POA: Diagnosis not present

## 2023-02-14 ENCOUNTER — Encounter: Payer: Self-pay | Admitting: Family Medicine

## 2023-02-16 ENCOUNTER — Other Ambulatory Visit: Payer: Self-pay

## 2023-02-16 NOTE — Telephone Encounter (Signed)
Please advise 

## 2023-02-17 MED ORDER — PHENTERMINE HCL 37.5 MG PO CAPS: 37.5000 mg | ORAL_CAPSULE | ORAL | 5 refills | Status: DC

## 2023-02-17 NOTE — Telephone Encounter (Signed)
I sent in for her to try taking a Phentermine tablet every morning to suppress the appetite. Have her follow up with [place] in 6 months

## 2023-03-15 DIAGNOSIS — M170 Bilateral primary osteoarthritis of knee: Secondary | ICD-10-CM | POA: Diagnosis not present

## 2023-03-29 ENCOUNTER — Ambulatory Visit (INDEPENDENT_AMBULATORY_CARE_PROVIDER_SITE_OTHER): Payer: Medicare Other | Admitting: Family Medicine

## 2023-03-29 ENCOUNTER — Encounter: Payer: Self-pay | Admitting: Family Medicine

## 2023-03-29 ENCOUNTER — Other Ambulatory Visit: Payer: Self-pay | Admitting: Family Medicine

## 2023-03-29 VITALS — BP 124/70 | HR 96 | Temp 98.2°F | Wt 211.0 lb

## 2023-03-29 DIAGNOSIS — M5431 Sciatica, right side: Secondary | ICD-10-CM | POA: Diagnosis not present

## 2023-03-29 DIAGNOSIS — K219 Gastro-esophageal reflux disease without esophagitis: Secondary | ICD-10-CM

## 2023-03-29 DIAGNOSIS — M159 Polyosteoarthritis, unspecified: Secondary | ICD-10-CM

## 2023-03-29 DIAGNOSIS — R739 Hyperglycemia, unspecified: Secondary | ICD-10-CM

## 2023-03-29 DIAGNOSIS — R609 Edema, unspecified: Secondary | ICD-10-CM | POA: Diagnosis not present

## 2023-03-29 DIAGNOSIS — D649 Anemia, unspecified: Secondary | ICD-10-CM | POA: Diagnosis not present

## 2023-03-29 LAB — LIPID PANEL
Cholesterol: 149 mg/dL (ref 0–200)
HDL: 54.3 mg/dL (ref >39.00)
LDL Cholesterol: 83 mg/dL (ref 0–99)
NonHDL: 94.42
Total CHOL/HDL Ratio: 3
Triglycerides: 55 mg/dL (ref 0.0–149.0)
VLDL: 11 mg/dL (ref 0.0–40.0)

## 2023-03-29 LAB — CBC WITH DIFFERENTIAL/PLATELET
Basophils Absolute: 0 10*3/uL (ref 0.0–0.1)
Basophils Relative: 0.7 % (ref 0.0–3.0)
Eosinophils Absolute: 0.1 10*3/uL (ref 0.0–0.7)
Eosinophils Relative: 1.2 % (ref 0.0–5.0)
HCT: 40.1 % (ref 36.0–46.0)
Hemoglobin: 12.8 g/dL (ref 12.0–15.0)
Lymphocytes Relative: 27.6 % (ref 12.0–46.0)
Lymphs Abs: 1.9 10*3/uL (ref 0.7–4.0)
MCHC: 32 g/dL (ref 30.0–36.0)
MCV: 89.9 fl (ref 78.0–100.0)
Monocytes Absolute: 0.6 10*3/uL (ref 0.1–1.0)
Monocytes Relative: 9.3 % (ref 3.0–12.0)
Neutro Abs: 4.2 10*3/uL (ref 1.4–7.7)
Neutrophils Relative %: 61.2 % (ref 43.0–77.0)
Platelets: 314 10*3/uL (ref 150.0–400.0)
RBC: 4.47 Mil/uL (ref 3.87–5.11)
RDW: 15.5 % (ref 11.5–15.5)
WBC: 6.9 10*3/uL (ref 4.0–10.5)

## 2023-03-29 LAB — TSH: TSH: 1.67 u[IU]/mL (ref 0.35–5.50)

## 2023-03-29 LAB — HEPATIC FUNCTION PANEL
ALT: 15 U/L (ref 0–35)
AST: 21 U/L (ref 0–37)
Albumin: 4.3 g/dL (ref 3.5–5.2)
Alkaline Phosphatase: 79 U/L (ref 39–117)
Bilirubin, Direct: 0.2 mg/dL (ref 0.0–0.3)
Total Bilirubin: 0.9 mg/dL (ref 0.2–1.2)
Total Protein: 7.9 g/dL (ref 6.0–8.3)

## 2023-03-29 LAB — BASIC METABOLIC PANEL
BUN: 16 mg/dL (ref 6–23)
CO2: 28 mEq/L (ref 19–32)
Calcium: 10.5 mg/dL (ref 8.4–10.5)
Chloride: 101 mEq/L (ref 96–112)
Creatinine, Ser: 0.77 mg/dL (ref 0.40–1.20)
GFR: 71.28 mL/min (ref >60.00)
Glucose, Bld: 92 mg/dL (ref 70–99)
Potassium: 4.4 mEq/L (ref 3.5–5.1)
Sodium: 138 mEq/L (ref 135–145)

## 2023-03-29 LAB — HEMOGLOBIN A1C: Hgb A1c MFr Bld: 5.7 % (ref 4.6–6.5)

## 2023-03-29 NOTE — Progress Notes (Signed)
Subjective:    Patient ID: Crystal Little, female    DOB: [birth year], 82 y.o.   MRN: [id number]  HPI Here to follow up on issues. She feels well in general except for her OA. She is scheduled for a left total knee replacement per Dr. Lequita Halt on 04-18-23. Her sciatica and back pain are stable. Her GERD is well controlled. Her leg swelling is stable. She uses Torsemide on an as needed basis.    Review of Systems  Constitutional: Negative.   HENT: Negative.    Eyes: Negative.   Respiratory: Negative.    Cardiovascular:  Positive for leg swelling. Negative for chest pain and palpitations.  Gastrointestinal: Negative.   Genitourinary:  Negative for decreased urine volume, difficulty urinating, dyspareunia, dysuria, enuresis, flank pain, frequency, hematuria, pelvic pain and urgency.  Musculoskeletal:  Positive for arthralgias and back pain.  Skin: Negative.   Neurological: Negative.  Negative for headaches.  Psychiatric/Behavioral: Negative.         Objective:   Physical Exam Constitutional:      General: She is not in acute distress.    Appearance: She is well-developed.     Comments: Walks with a cane   HENT:     Head: Normocephalic and atraumatic.     Right Ear: External ear normal.     Left Ear: External ear normal.     Nose: Nose normal.     Mouth/Throat:     Pharynx: No oropharyngeal exudate.  Eyes:     General: No scleral icterus.    Conjunctiva/sclera: Conjunctivae normal.     Pupils: Pupils are equal, round, and reactive to light.  Neck:     Thyroid: No thyromegaly.     Vascular: No JVD.  Cardiovascular:     Rate and Rhythm: Normal rate and regular rhythm.     Pulses: Normal pulses.     Heart sounds: Normal heart sounds. No murmur heard.    No friction rub. No gallop.  Pulmonary:     Effort: Pulmonary effort is normal. No respiratory distress.     Breath sounds: Normal breath sounds. No wheezing or rales.  Chest:     Chest wall: No tenderness.  Abdominal:      General: Bowel sounds are normal. There is no distension.     Palpations: Abdomen is soft. There is no mass.     Tenderness: There is no abdominal tenderness. There is no guarding or rebound.  Musculoskeletal:        General: No tenderness. Normal range of motion.     Cervical back: Normal range of motion and neck supple.  Lymphadenopathy:     Cervical: No cervical adenopathy.  Skin:    General: Skin is warm and dry.     Findings: No erythema or rash.  Neurological:     General: No focal deficit present.     Mental Status: She is alert and oriented to person, place, and time.     Cranial Nerves: No cranial nerve deficit.     Motor: No abnormal muscle tone.     Coordination: Coordination normal.     Deep Tendon Reflexes: Reflexes are normal and symmetric. Reflexes normal.  Psychiatric:        Mood and Affect: Mood normal.        Behavior: Behavior normal.        Thought Content: Thought content normal.        Judgment: Judgment normal.  Assessment & Plan:  She is doing well with her sciatica, leg swelling, and GERD. We will get fasting labs to check her lipids, Hgb, etc. She will get the knee surgery as above. We spent a total of ( 35  ) minutes reviewing records and discussing these issues.  Gershon Crane, MD

## 2023-03-31 DIAGNOSIS — M170 Bilateral primary osteoarthritis of knee: Secondary | ICD-10-CM | POA: Diagnosis not present

## 2023-04-05 NOTE — Progress Notes (Signed)
Sent message, via epic in basket, requesting orders in epic from surgeon.  

## 2023-04-05 NOTE — Patient Instructions (Signed)
SURGICAL WAITING ROOM VISITATION  Patients having surgery or a procedure may have no more than 2 support people in the waiting area - these visitors may rotate.    Children under the age of 51 must have an adult with them who is not the patient.  If the patient needs to stay at the hospital during part of their recovery, the visitor guidelines for inpatient rooms apply. Pre-op nurse will coordinate an appropriate time for 1 support person to accompany patient in pre-op.  This support person may not rotate.    Please refer to the [facility] website for the visitor guidelines for Inpatients (after your surgery is over and you are in a regular room).       Your procedure is scheduled on: 04-18-23   Report to [facility] Main Entrance    Report to admitting at        0515  AM   Call this number if you have problems the morning of surgery [phone]   Do not eat food :After Midnight.   After Midnight you may have the following liquids until __0415____ AM/ DAY OF SURGERY  Water Non-Citrus Juices (without pulp, NO RED-Apple, White grape, White cranberry) Black Coffee (NO MILK/CREAM OR CREAMERS, sugar ok)  Clear Tea (NO MILK/CREAM OR CREAMERS, sugar ok) regular and decaf                             Plain Jell-O (NO RED)                                           Fruit ices (not with fruit pulp, NO RED)                                     Popsicles (NO RED)                                                               Sports drinks like Gatorade (NO RED)                   The day of surgery:  Drink ONE (1) Pre-Surgery Clear Ensure at       0400 AM the morning of surgery. Drink in one sitting. Do not sip.  This drink was given to you during your hospital  pre-op appointment visit. Nothing else to drink after completing the  Pre-Surgery Clear Ensure by 0415 am           If you have questions, please contact your surgeon's office.   FOLLOW  ANY ADDITIONAL PRE OP  INSTRUCTIONS YOU RECEIVED FROM YOUR SURGEON'S OFFICE!!!     Oral Hygiene is also important to reduce your risk of infection.                                    Remember - BRUSH YOUR TEETH THE MORNING OF SURGERY WITH YOUR REGULAR TOOTHPASTE  DENTURES WILL BE REMOVED PRIOR TO SURGERY  PLEASE DO NOT APPLY "Poly grip" OR ADHESIVES!!!   Do NOT smoke after Midnight   Take these medicines the morning of surgery with A SIP OF WATER: omeprazole                               You may not have any metal on your body including hair pins, jewelry, and body piercing             Do not wear make-up, lotions, powders, perfumes/cologne, or deodorant  Do not wear nail polish including gel and S&S, artificial/acrylic nails, or any other type of covering on natural nails including finger and toenails. If you have artificial nails, gel coating, etc. that needs to be removed by a nail salon please have this removed prior to surgery or surgery may need to be canceled/ delayed if the surgeon/ anesthesia feels like they are unable to be safely monitored.   Do not shave  48 hours prior to surgery.              Do not bring valuables to the hospital. [facility] IS NOT             RESPONSIBLE   FOR VALUABLES.   Contacts, glasses, dentures or bridgework may not be worn into surgery.   Bring small overnight bag day of surgery.   DO NOT BRING YOUR HOME MEDICATIONS TO THE HOSPITAL. PHARMACY WILL DISPENSE MEDICATIONS LISTED ON YOUR MEDICATION LIST TO YOU DURING YOUR ADMISSION IN THE HOSPITAL!    Patients discharged on the day of surgery will not be allowed to drive home.  Someone NEEDS to stay with you for the first 24 hours after anesthesia.   Special Instructions: Bring a copy of your healthcare power of attorney and living will documents the day of surgery if you haven't scanned them before.              Please read over the following fact sheets you were given: IF YOU HAVE QUESTIONS ABOUT YOUR PRE-OP  INSTRUCTIONS PLEASE CALL [phone]  If you test positive for Covid or have been in contact with anyone that has tested positive in the last 10 days please notify you surgeon.      Pre-operative 5 CHG Bath Instructions   You can play a key role in reducing the risk of infection after surgery. Your skin needs to be as free of germs as possible. You can reduce the number of germs on your skin by washing with CHG (chlorhexidine gluconate) soap before surgery. CHG is an antiseptic soap that kills germs and continues to kill germs even after washing.   DO NOT use if you have an allergy to chlorhexidine/CHG or antibacterial soaps. If your skin becomes reddened or irritated, stop using the CHG and notify one of our RNs at [phone].   Please shower with the CHG soap starting 4 days before surgery using the following schedule:     Please keep in mind the following:  DO NOT shave, including legs and underarms, starting the day of your first shower.   You may shave your face at any point before/day of surgery.  Place clean sheets on your bed the day you start using CHG soap. Use a clean washcloth (not used since being washed) for each shower. DO NOT sleep with pets once you start using the CHG.   CHG Shower Instructions:  If you choose to wash your hair  and private area, wash first with your normal shampoo/soap.  After you use shampoo/soap, rinse your hair and body thoroughly to remove shampoo/soap residue.  Turn the water OFF and apply about 3 tablespoons (45 ml) of CHG soap to a CLEAN washcloth.  Apply CHG soap ONLY FROM YOUR NECK DOWN TO YOUR TOES (washing for 3-5 minutes)  DO NOT use CHG soap on face, private areas, open wounds, or sores.  Pay special attention to the area where your surgery is being performed.  If you are having back surgery, having someone wash your back for you may be helpful. Wait 2 minutes after CHG soap is applied, then you may rinse off the CHG soap.  Pat dry  with a clean towel  Put on clean clothes/pajamas   If you choose to wear lotion, please use ONLY the CHG-compatible lotions on the back of this paper.     Additional instructions for the day of surgery: DO NOT APPLY any lotions, deodorants, cologne, or perfumes.   Put on clean/comfortable clothes.  Brush your teeth.  Ask your nurse before applying any prescription medications to the skin.      CHG Compatible Lotions   Aveeno Moisturizing lotion  Cetaphil Moisturizing Cream  Cetaphil Moisturizing Lotion  Clairol Herbal Essence Moisturizing Lotion, Dry Skin  Clairol Herbal Essence Moisturizing Lotion, Extra Dry Skin  Clairol Herbal Essence Moisturizing Lotion, Normal Skin  Curel Age Defying Therapeutic Moisturizing Lotion with Alpha Hydroxy  Curel Extreme Care Body Lotion  Curel Soothing Hands Moisturizing Hand Lotion  Curel Therapeutic Moisturizing Cream, Fragrance-Free  Curel Therapeutic Moisturizing Lotion, Fragrance-Free  Curel Therapeutic Moisturizing Lotion, Original Formula  Eucerin Daily Replenishing Lotion  Eucerin Dry Skin Therapy Plus Alpha Hydroxy Crme  Eucerin Dry Skin Therapy Plus Alpha Hydroxy Lotion  Eucerin Original Crme  Eucerin Original Lotion  Eucerin Plus Crme Eucerin Plus Lotion  Eucerin TriLipid Replenishing Lotion  Keri Anti-Bacterial Hand Lotion  Keri Deep Conditioning Original Lotion Dry Skin Formula Softly Scented  Keri Deep Conditioning Original Lotion, Fragrance Free Sensitive Skin Formula  Keri Lotion Fast Absorbing Fragrance Free Sensitive Skin Formula  Keri Lotion Fast Absorbing Softly Scented Dry Skin Formula  Keri Original Lotion  Keri Skin Renewal Lotion Keri Silky Smooth Lotion  Keri Silky Smooth Sensitive Skin Lotion  Nivea Body Creamy Conditioning Oil  Nivea Body Extra Enriched Lotion  Nivea Body Original Lotion  Nivea Body Sheer Moisturizing Lotion Nivea Crme  Nivea Skin Firming Lotion  NutraDerm 30 Skin Lotion  NutraDerm Skin  Lotion  NutraDerm Therapeutic Skin Cream  NutraDerm Therapeutic Skin Lotion  ProShield Protective Hand Cream  Provon moisturizing lotion     Incentive Spirometer  An incentive spirometer is a tool that can help keep your lungs clear and active. This tool measures how well you are filling your lungs with each breath. Taking long deep breaths may help reverse or decrease the chance of developing breathing (pulmonary) problems (especially infection) following: A long period of time when you are unable to move or be active. BEFORE THE PROCEDURE  If the spirometer includes an indicator to show your best effort, your nurse or respiratory therapist will set it to a desired goal. If possible, sit up straight or lean slightly forward. Try not to slouch. Hold the incentive spirometer in an upright position. INSTRUCTIONS FOR USE  Sit on the edge of your bed if possible, or sit up as far as you can in bed or on a chair. Hold the incentive spirometer in an  upright position. Breathe out normally. Place the mouthpiece in your mouth and seal your lips tightly around it. Breathe in slowly and as deeply as possible, raising the piston or the ball toward the top of the column. Hold your breath for 3-5 seconds or for as long as possible. Allow the piston or ball to fall to the bottom of the column. Remove the mouthpiece from your mouth and breathe out normally. Rest for a few seconds and repeat Steps 1 through 7 at least 10 times every 1-2 hours when you are awake. Take your time and take a few normal breaths between deep breaths. The spirometer may include an indicator to show your best effort. Use the indicator as a goal to work toward during each repetition. After each set of 10 deep breaths, practice coughing to be sure your lungs are clear. If you have an incision (the cut made at the time of surgery), support your incision when coughing by placing a pillow or rolled up towels firmly against it. Once you  are able to get out of bed, walk around indoors and cough well. You may stop using the incentive spirometer when instructed by your caregiver.  RISKS AND COMPLICATIONS Take your time so you do not get dizzy or light-headed. If you are in pain, you may need to take or ask for pain medication before doing incentive spirometry. It is harder to take a deep breath if you are having pain. AFTER USE Rest and breathe slowly and easily. It can be helpful to keep track of a log of your progress. Your caregiver can provide you with a simple table to help with this. If you are using the spirometer at home, follow these instructions: SEEK MEDICAL CARE IF:  You are having difficultly using the spirometer. You have trouble using the spirometer as often as instructed. Your pain medication is not giving enough relief while using the spirometer. You develop fever of 100.5 F (38.1 C) or higher. SEEK IMMEDIATE MEDICAL CARE IF:  You cough up bloody sputum that had not been present before. You develop fever of 102 F (38.9 C) or greater. You develop worsening pain at or near the incision site. MAKE SURE YOU:  Understand these instructions. Will watch your condition. Will get help right away if you are not doing well or get worse. Document Released: 01/31/2007 Document Revised: 12/13/2011 Document Reviewed: 04/03/2007 [facility] Patient Information 2014 [place], [place].   ________________________________________________________________________

## 2023-04-05 NOTE — Progress Notes (Addendum)
PCP - Larey Dresser LOV 04-08-23 epic clearance note and on chart  Cardiologist - no  PPM/ICD -  Device Orders -  Rep Notified -   Chest x-ray -  EKG -  Stress Test -  ECHO - 2020 epic Cardiac Cath -  CBC/DIFF, BMP HgbAIC 03-29-23 EPIC  Sleep Study -  CPAP -   Fasting Blood Sugar -  Checks Blood Sugar _____ times a day  Blood Thinner Instructions: Aspirin Instructions:  ERAS Protcol - PRE-SURGERY Ensure    COVID vaccine -yes  Activity--Able to complete ADL's without SOB or CP Anesthesia review: GERD  Patient denies shortness of breath, fever, cough and chest pain at PAT appointment   All instructions explained to the patient, with a verbal understanding of the material. Patient agrees to go over the instructions while at home for a better understanding. Patient also instructed to self quarantine after being tested for COVID-19. The opportunity to ask questions was provided.

## 2023-04-08 ENCOUNTER — Telehealth: Payer: Self-pay | Admitting: Family Medicine

## 2023-04-08 ENCOUNTER — Other Ambulatory Visit: Payer: Self-pay

## 2023-04-08 ENCOUNTER — Encounter (HOSPITAL_COMMUNITY)
Admission: RE | Admit: 2023-04-08 | Discharge: 2023-04-08 | Disposition: A | Discharge status: Home / Self Care | Payer: Medicare Other | Source: Ambulatory Visit | Attending: Orthopedic Surgery | Admitting: Orthopedic Surgery

## 2023-04-08 ENCOUNTER — Encounter: Payer: Self-pay | Admitting: Family Medicine

## 2023-04-08 ENCOUNTER — Encounter (HOSPITAL_COMMUNITY): Payer: Self-pay

## 2023-04-08 VITALS — BP 139/57 | HR 88 | Temp 97.9°F | Resp 16 | Ht 65.5 in | Wt 210.0 lb

## 2023-04-08 DIAGNOSIS — Z01818 Encounter for other preprocedural examination: Secondary | ICD-10-CM

## 2023-04-08 DIAGNOSIS — Z01812 Encounter for preprocedural laboratory examination: Secondary | ICD-10-CM | POA: Diagnosis not present

## 2023-04-08 HISTORY — DX: Anemia, unspecified: D64.9

## 2023-04-08 NOTE — Telephone Encounter (Signed)
Patient dropped off document  Preoperative Risk Assessment , to be filled out by provider. Patient requested to send it back via Fax within ASAP. Pt is having surgery on 04/18/23 and is requesting form to be completed by then. Document is located in providers tray at front office.Please advise at Mobile [phone] (mobile)

## 2023-04-08 NOTE — Telephone Encounter (Signed)
Pt surgical form received and completed by Dr Clent Ridges this morning. Forms and pt last office note and lab results was faxed to the fax number provided on the form and confirmation received. Pt was given a copy to take with her.

## 2023-04-11 DIAGNOSIS — M25662 Stiffness of left knee, not elsewhere classified: Secondary | ICD-10-CM | POA: Diagnosis not present

## 2023-04-11 DIAGNOSIS — M25562 Pain in left knee: Secondary | ICD-10-CM | POA: Diagnosis not present

## 2023-04-11 DIAGNOSIS — M1712 Unilateral primary osteoarthritis, left knee: Secondary | ICD-10-CM | POA: Diagnosis not present

## 2023-04-11 NOTE — H&P (Signed)
TOTAL KNEE ADMISSION H&P  Patient is being admitted for left total knee arthroplasty.  Subjective:  Chief Complaint: Left knee pain.  HPI: Crystal Little, 82 y.o. female has a history of pain and functional disability in the left knee due to arthritis and has failed non-surgical conservative treatments for greater than 12 weeks to include corticosteriod injections, viscosupplementation injections, and activity modification. Onset of symptoms was gradual, starting 5 years ago with gradually worsening course since that time. The patient noted no past surgery on the left knee.  Patient currently rates pain in the left knee at 8 out of 10 with activity. Patient has worsening of pain with activity and weight bearing and pain that interferes with activities of daily living. Patient has evidence of subchondral sclerosis, periarticular osteophytes, and joint space narrowing by imaging studies. There is no active infection.  Patient Active Problem List   Diagnosis Date Noted   Leg swelling 11/09/2021   Anemia 10/24/2018   Sciatica, right side 05/17/2017   Special screening for malignant neoplasms, colon 04/26/2011   Diverticulosis of colon (without mention of hemorrhage) 04/26/2011   Dysphagia, pharyngoesophageal phase 01/08/2010   METATARSALGIA 12/24/2008   PLANTAR FASCIITIS, LEFT 12/24/2008   Osteoarthritis 05/09/2007   DEPENDENT EDEMA 05/09/2007   CHICKENPOX, HX OF 05/09/2007   GERD 05/02/2007    Past Medical History:  Diagnosis Date   Anemia    Diverticulosis    Frequent UTI    sees Dr. Earlene Plater   GERD (gastroesophageal reflux disease)    Gynecological examination    sees Dr. Artist Pais   IBS (irritable bowel syndrome)    Leg edema    Osteoarthritis    Status post dilation of esophageal narrowing    Varicose veins of legs    sees [facility]     Past Surgical History:  Procedure Laterality Date   BACK SURGERY  10/2021   lumbar 4-5   COLONOSCOPY  04/26/2011   per  Dr. Jarold Motto, diverticulosis only, no repeats needed   endovascular repair of bilateral leg varicosities     per Dr. Jimmie Molly    ESOPHAGOGASTRODUODENOSCOPY  01/19/2010   per Dr. Jarold Motto, with dilatation    ESOPHAGOGASTRODUODENOSCOPY (EGD) WITH ESOPHAGEAL DILATION     right knee arthroscopy  12/17/05 and 01-06-12   per Dr. Eulah Pont   TONSILLECTOMY      Prior to Admission medications   Medication Sig Start Date End Date Taking? Authorizing Provider  Calcium-Vitamin D-Vitamin K (VIACTIV CALCIUM PLUS D PO) Take 1 tablet by mouth daily.   Yes [provider]  Carboxymethylcellulose Sodium (THERATEARS OP) Place 1 drop into both eyes daily as needed (dry eyes).   Yes [provider]  cholecalciferol (VITAMIN D3) 25 MCG (1000 UNIT) tablet Take 1,000 Units by mouth daily.   Yes [provider]  ibuprofen (ADVIL) 200 MG tablet Take 400 mg by mouth every 8 (eight) hours as needed for moderate pain.   Yes [provider]  omeprazole (PRILOSEC) 40 MG capsule TAKE 1 CAPSULE (40 MG TOTAL) BY MOUTH DAILY. 03/29/23  Yes Nelwyn Salisbury, MD  torsemide (DEMADEX) 20 MG tablet TAKE 2 TABLETS BY MOUTH TWICE A DAY Patient taking differently: Take 40 mg by mouth 2 (two) times daily as needed (swelling). 01/17/23  Yes Nelwyn Salisbury, MD  vitamin E 180 MG (400 UNITS) capsule Take 400 Units by mouth daily.   Yes [provider]  phentermine 37.5 MG capsule Take 1 capsule (37.5 mg total) by  mouth every morning. 02/17/23   Nelwyn Salisbury, MD    Allergies  Allergen Reactions   Amoxicillin     Unknown reaction     Social History   Socioeconomic History   Marital status: Single    Spouse name: Not on file   Number of children: 3   Years of education: Not on file   Highest education level: Not on file  Occupational History   Occupation: [employer] pilot  Tobacco Use   Smoking status: Never   Smokeless tobacco: Never  Vaping Use   Vaping Use: Never used  Substance and  Sexual Activity   Alcohol use: Not Currently    Alcohol/week: 2.0 standard drinks of alcohol    Types: 2 Glasses of wine per week    Comment: occ   Drug use: No   Sexual activity: Not Currently  Other Topics Concern   Not on file  Social History Narrative   Not on file   Social Determinants of Health   Financial Resource Strain: Low Risk  (06/08/2022)   Overall Financial Resource Strain (CARDIA)    Difficulty of Paying Living Expenses: Not hard at all  Food Insecurity: No Food Insecurity (06/08/2022)   Hunger Vital Sign    Worried About Running Out of Food in the Last Year: Never true    Ran Out of Food in the Last Year: Never true  Transportation Needs: No Transportation Needs (06/08/2022)   PRAPARE - Administrator, Civil Service (Medical): No    Lack of Transportation (Non-Medical): No  Physical Activity: Inactive (06/08/2022)   Exercise Vital Sign    Days of Exercise per Week: 0 days    Minutes of Exercise per Session: 0 min  Stress: No Stress Concern Present (06/08/2022)   [employer] of Occupational Health - Occupational Stress Questionnaire    Feeling of Stress : Not at all  Social Connections: Moderately Integrated (06/08/2022)   Social Connection and Isolation Panel [NHANES]    Frequency of Communication with Friends and Family: More than three times a week    Frequency of Social Gatherings with Friends and Family: More than three times a week    Attends Religious Services: More than 4 times per year    Active Member of [employer] or Organizations: Yes    Attends Banker Meetings: More than 4 times per year    Marital Status: Widowed  Intimate Partner Violence: Not At Risk (06/08/2022)   Humiliation, Afraid, Rape, and Kick questionnaire    Fear of Current or Ex-Partner: No    Emotionally Abused: No    Physically Abused: No    Sexually Abused: No    Tobacco Use: Low Risk  (04/08/2023)   Patient History    Smoking Tobacco Use: Never    Smokeless  Tobacco Use: Never    Passive Exposure: Not on file   Social History   Substance and Sexual Activity  Alcohol Use Not Currently   Alcohol/week: 2.0 standard drinks of alcohol   Types: 2 Glasses of wine per week   Comment: occ    Family History  Problem Relation Age of Onset   Irritable bowel syndrome Mother    Pancreatic cancer Paternal Grandmother    Diabetes Maternal Aunt    Diabetes Maternal Uncle    Heart disease Paternal Grandfather    Colon cancer Neg Hx    Colon polyps Neg Hx    Kidney disease Neg Hx     ROS  Objective:  Physical Exam: - Well-developed female, alert and oriented, no apparent distress.    - Left hip Normal range of motion with no discomfort.    - Left knee No effusion, range of motion is about 5-115 with crepitus on range of motion. Tender medial greater than lateral with no instability.    - Right knee No effusion, range of motion is about 5-115 with tenderness lateral greater than medial. Slight valgus deformity, no instability.    - Gait Antalgic gait pattern on the left.   IMAGING:  Radiographs from a few months ago were reviewed. They demonstrate severe arthritis in both knees. In the left knee, there is bone-on-bone arthritis in the medial and patellofemoral regions. In the right knee, there is bone-on-bone arthritis in the lateral and patellofemoral compartments.  Assessment/Plan:  End stage arthritis, left knee   The patient history, physical examination, clinical judgment of the provider and imaging studies are consistent with end stage degenerative joint disease of the left knee and total knee arthroplasty is deemed medically necessary. The treatment options including medical management, injection therapy arthroscopy and arthroplasty were discussed at length. The risks and benefits of total knee arthroplasty were presented and reviewed. The risks due to aseptic loosening, infection, stiffness, patella tracking problems, thromboembolic  complications and other imponderables were discussed. The patient acknowledged the explanation, agreed to proceed with the plan and consent was signed. Patient is being admitted for inpatient treatment for surgery, pain control, PT, OT, prophylactic antibiotics, VTE prophylaxis, progressive ambulation and ADLs and discharge planning. The patient is planning to be discharged  home with OPPT scheduled .   Patient's anticipated LOS is less than 2 midnights, meeting these requirements: - Younger than 44 - Lives within 1 hour of care - Has a competent adult at home to recover with post-op recover - NO history of  - Chronic pain requiring opiods  - Diabetes  - Coronary Artery Disease  - Heart failure  - Heart attack  - Stroke  - DVT/VTE  - Cardiac arrhythmia  - Respiratory Failure/COPD  - Renal failure  - Anemia  - Advanced Liver disease    Therapy Plans: EO Disposition: Home with daughter in law and granddaughter Planned DVT Prophylaxis: Aspirin 81mg  BID DME Needed: Has RW. Ice machine ordered PCP: Gershon Crane, MD (clearance received) TXA: IV Allergies: NKDA Anesthesia Concerns: None BMI: 34.8 Last HgbA1c: 5.7% on 03/29/23 (not diabetic) Pharmacy: [facility] on [address]  Other: -Clearance pending, pt saw Dr. Clent Ridges last week and will call to check on this. -Mild GI upset with oxycodone in the past, we discussed and will try this after surgery and adjust meds if needed - Patient was instructed on what medications to stop prior to surgery. - Follow-up visit in 2 weeks with Dr. Lequita Halt - Begin physical therapy following surgery - Pre-operative lab work as pre-surgical testing - Prescriptions will be provided in hospital at time of discharge  Weston Brass, PA-C Orthopedic Surgery [facility] [place]

## 2023-04-16 NOTE — Anesthesia Preprocedure Evaluation (Addendum)
Anesthesia Evaluation  Patient identified by MRN, date of birth, ID band Patient awake    Reviewed: Allergy & Precautions, NPO status , Patient's Chart, lab work & pertinent test results  History of Anesthesia Complications Negative for: history of anesthetic complications  Airway Mallampati: III  TM Distance: >3 FB Neck ROM: Full    Dental  (+) Dental Advisory Given   Pulmonary neg pulmonary ROS   Pulmonary exam normal        Cardiovascular negative cardio ROS Normal cardiovascular exam     Neuro/Psych negative neurological ROS  negative psych ROS   GI/Hepatic Neg liver ROS,GERD  Medicated and Controlled,,  Endo/Other   Obesity   Renal/GU negative Renal ROS     Musculoskeletal  (+) Arthritis , Osteoarthritis,    Abdominal   Peds  Hematology negative hematology ROS (+)   Anesthesia Other Findings Previous lumbar surgery  Reproductive/Obstetrics                             Anesthesia Physical Anesthesia Plan  ASA: 2  Anesthesia Plan: Spinal   Post-op Pain Management: Regional block* and Tylenol PO (pre-op)*   Induction:   PONV Risk Score and Plan: 2 and Treatment may vary due to age or medical condition, Propofol infusion and Ondansetron  Airway Management Planned: Natural Airway and Simple Face Mask  Additional Equipment: None  Intra-op Plan:   Post-operative Plan:   Informed Consent: I have reviewed the patients History and Physical, chart, labs and discussed the procedure including the risks, benefits and alternatives for the proposed anesthesia with the patient or authorized representative who has indicated his/her understanding and acceptance.       Plan Discussed with: CRNA and Anesthesiologist  Anesthesia Plan Comments: (Labs reviewed, platelets acceptable. Discussed risks and benefits of spinal, including spinal/epidural hematoma, infection, failed block, and  PDPH. Patient expressed understanding and wished to proceed. )       Anesthesia Quick Evaluation

## 2023-04-18 ENCOUNTER — Other Ambulatory Visit: Payer: Self-pay

## 2023-04-18 ENCOUNTER — Encounter (HOSPITAL_COMMUNITY)
Admission: RE | Disposition: A | Discharge status: Home / Self Care | Payer: Self-pay | Source: Ambulatory Visit | Attending: Orthopedic Surgery

## 2023-04-18 ENCOUNTER — Ambulatory Visit (HOSPITAL_COMMUNITY): Payer: Medicare Other | Admitting: Certified Registered Nurse Anesthetist

## 2023-04-18 ENCOUNTER — Encounter (HOSPITAL_COMMUNITY): Payer: Self-pay | Admitting: Orthopedic Surgery

## 2023-04-18 ENCOUNTER — Ambulatory Visit (HOSPITAL_BASED_OUTPATIENT_CLINIC_OR_DEPARTMENT_OTHER): Payer: Medicare Other | Admitting: Certified Registered Nurse Anesthetist

## 2023-04-18 ENCOUNTER — Observation Stay (HOSPITAL_COMMUNITY)
Admission: RE | Admit: 2023-04-18 | Discharge: 2023-04-20 | Disposition: A | Discharge status: Home / Self Care | Payer: Medicare Other | Source: Ambulatory Visit | Attending: Orthopedic Surgery | Admitting: Orthopedic Surgery

## 2023-04-18 DIAGNOSIS — M1712 Unilateral primary osteoarthritis, left knee: Secondary | ICD-10-CM

## 2023-04-18 DIAGNOSIS — M5431 Sciatica, right side: Secondary | ICD-10-CM | POA: Insufficient documentation

## 2023-04-18 DIAGNOSIS — M179 Osteoarthritis of knee, unspecified: Principal | ICD-10-CM | POA: Diagnosis present

## 2023-04-18 DIAGNOSIS — R1314 Dysphagia, pharyngoesophageal phase: Secondary | ICD-10-CM | POA: Diagnosis not present

## 2023-04-18 DIAGNOSIS — Z6833 Body mass index (BMI) 33.0-33.9, adult: Secondary | ICD-10-CM | POA: Diagnosis not present

## 2023-04-18 DIAGNOSIS — G8918 Other acute postprocedural pain: Secondary | ICD-10-CM | POA: Diagnosis not present

## 2023-04-18 DIAGNOSIS — E669 Obesity, unspecified: Secondary | ICD-10-CM | POA: Insufficient documentation

## 2023-04-18 DIAGNOSIS — D649 Anemia, unspecified: Secondary | ICD-10-CM | POA: Diagnosis not present

## 2023-04-18 DIAGNOSIS — K219 Gastro-esophageal reflux disease without esophagitis: Secondary | ICD-10-CM | POA: Diagnosis not present

## 2023-04-18 HISTORY — PX: TOTAL KNEE ARTHROPLASTY: SHX125

## 2023-04-18 SURGERY — ARTHROPLASTY, KNEE, TOTAL
Anesthesia: Spinal | Site: Knee | Laterality: Left

## 2023-04-18 MED ORDER — TRANEXAMIC ACID-NACL 1000-0.7 MG/100ML-% IV SOLN
1000.0000 mg | INTRAVENOUS | Status: AC
  Administered 2023-04-18: 1000 mg via INTRAVENOUS
  Filled 2023-04-18: qty 100

## 2023-04-18 MED ORDER — POVIDONE-IODINE 10 % EX SWAB: 2.0000 | Freq: Once | CUTANEOUS | Status: DC

## 2023-04-18 MED ORDER — ONDANSETRON HCL 4 MG PO TABS: 4.0000 mg | ORAL_TABLET | Freq: Four times a day (QID) | ORAL | Status: DC | PRN

## 2023-04-18 MED ORDER — ACETAMINOPHEN 500 MG PO TABS: 1000.0000 mg | ORAL_TABLET | Freq: Once | ORAL | Status: DC

## 2023-04-18 MED ORDER — METOCLOPRAMIDE HCL 5 MG PO TABS: 5.0000 mg | ORAL_TABLET | Freq: Three times a day (TID) | ORAL | Status: DC | PRN

## 2023-04-18 MED ORDER — OXYCODONE HCL 5 MG PO TABS
5.0000 mg | ORAL_TABLET | ORAL | Status: DC | PRN
  Administered 2023-04-18 – 2023-04-19 (×3): 10 mg via ORAL
  Administered 2023-04-19: 5 mg via ORAL
  Administered 2023-04-19: 10 mg via ORAL
  Administered 2023-04-19: 5 mg via ORAL
  Administered 2023-04-20 (×3): 10 mg via ORAL
  Filled 2023-04-18 (×2): qty 2
  Filled 2023-04-18: qty 1
  Filled 2023-04-18 (×2): qty 2
  Filled 2023-04-18 (×2): qty 1
  Filled 2023-04-18: qty 2
  Filled 2023-04-18: qty 1
  Filled 2023-04-18: qty 2

## 2023-04-18 MED ORDER — PROPOFOL 1000 MG/100ML IV EMUL
INTRAVENOUS | Status: AC
  Filled 2023-04-18: qty 100

## 2023-04-18 MED ORDER — DEXAMETHASONE SODIUM PHOSPHATE 10 MG/ML IJ SOLN
INTRAMUSCULAR | Status: AC
  Filled 2023-04-18: qty 1

## 2023-04-18 MED ORDER — DIPHENHYDRAMINE HCL 12.5 MG/5ML PO ELIX: 12.5000 mg | ORAL_SOLUTION | ORAL | Status: DC | PRN

## 2023-04-18 MED ORDER — METHOCARBAMOL 500 MG PO TABS
500.0000 mg | ORAL_TABLET | Freq: Four times a day (QID) | ORAL | Status: DC | PRN
  Administered 2023-04-19 – 2023-04-20 (×4): 500 mg via ORAL
  Filled 2023-04-18 (×4): qty 1

## 2023-04-18 MED ORDER — FLEET ENEMA 7-19 GM/118ML RE ENEM: 1.0000 | ENEMA | Freq: Once | RECTAL | Status: DC | PRN

## 2023-04-18 MED ORDER — ONDANSETRON HCL 4 MG/2ML IJ SOLN
INTRAMUSCULAR | Status: AC
  Filled 2023-04-18: qty 2

## 2023-04-18 MED ORDER — PANTOPRAZOLE SODIUM 40 MG PO TBEC
80.0000 mg | DELAYED_RELEASE_TABLET | Freq: Every day | ORAL | Status: DC
  Administered 2023-04-19 – 2023-04-20 (×2): 80 mg via ORAL
  Filled 2023-04-18 (×2): qty 2

## 2023-04-18 MED ORDER — METHOCARBAMOL 500 MG IVPB - SIMPLE MED
INTRAVENOUS | Status: AC
  Filled 2023-04-18: qty 55

## 2023-04-18 MED ORDER — ASPIRIN 81 MG PO CHEW
81.0000 mg | CHEWABLE_TABLET | Freq: Two times a day (BID) | ORAL | Status: DC
  Administered 2023-04-19 – 2023-04-20 (×3): 81 mg via ORAL
  Filled 2023-04-18 (×3): qty 1

## 2023-04-18 MED ORDER — METHOCARBAMOL 500 MG IVPB - SIMPLE MED
500.0000 mg | Freq: Four times a day (QID) | INTRAVENOUS | Status: DC | PRN
  Administered 2023-04-18: 500 mg via INTRAVENOUS
  Filled 2023-04-18: qty 55

## 2023-04-18 MED ORDER — STERILE WATER FOR IRRIGATION IR SOLN
Status: DC | PRN
  Administered 2023-04-18: 2000 mL

## 2023-04-18 MED ORDER — LACTATED RINGERS IV SOLN: INTRAVENOUS | Status: DC

## 2023-04-18 MED ORDER — ORAL CARE MOUTH RINSE: 15.0000 mL | Freq: Once | OROMUCOSAL | Status: AC

## 2023-04-18 MED ORDER — OXYCODONE HCL 5 MG PO TABS
5.0000 mg | ORAL_TABLET | Freq: Once | ORAL | Status: AC | PRN
  Administered 2023-04-18: 5 mg via ORAL

## 2023-04-18 MED ORDER — TRAMADOL HCL 50 MG PO TABS
50.0000 mg | ORAL_TABLET | Freq: Four times a day (QID) | ORAL | Status: DC | PRN
  Administered 2023-04-18: 100 mg via ORAL
  Administered 2023-04-19: 50 mg via ORAL
  Filled 2023-04-18: qty 2
  Filled 2023-04-18: qty 1

## 2023-04-18 MED ORDER — FENTANYL CITRATE PF 50 MCG/ML IJ SOSY
25.0000 ug | PREFILLED_SYRINGE | INTRAMUSCULAR | Status: DC | PRN
  Administered 2023-04-18: 50 ug via INTRAVENOUS

## 2023-04-18 MED ORDER — CEFAZOLIN SODIUM-DEXTROSE 2-4 GM/100ML-% IV SOLN
2.0000 g | Freq: Four times a day (QID) | INTRAVENOUS | Status: AC
  Administered 2023-04-18 (×2): 2 g via INTRAVENOUS
  Filled 2023-04-18 (×2): qty 100

## 2023-04-18 MED ORDER — PHENOL 1.4 % MT LIQD: 1.0000 | OROMUCOSAL | Status: DC | PRN

## 2023-04-18 MED ORDER — METOCLOPRAMIDE HCL 5 MG/ML IJ SOLN: 5.0000 mg | Freq: Three times a day (TID) | INTRAMUSCULAR | Status: DC | PRN

## 2023-04-18 MED ORDER — FENTANYL CITRATE (PF) 100 MCG/2ML IJ SOLN
INTRAMUSCULAR | Status: DC | PRN
  Administered 2023-04-18 (×4): 50 ug via INTRAVENOUS

## 2023-04-18 MED ORDER — PROPOFOL 500 MG/50ML IV EMUL
INTRAVENOUS | Status: AC
  Filled 2023-04-18: qty 50

## 2023-04-18 MED ORDER — HYDROMORPHONE HCL 1 MG/ML IJ SOLN: 0.5000 mg | INTRAMUSCULAR | Status: DC | PRN

## 2023-04-18 MED ORDER — SODIUM CHLORIDE (PF) 0.9 % IJ SOLN
INTRAMUSCULAR | Status: AC
  Filled 2023-04-18: qty 10

## 2023-04-18 MED ORDER — SODIUM CHLORIDE (PF) 0.9 % IJ SOLN
INTRAMUSCULAR | Status: DC | PRN
  Administered 2023-04-18: 60 mL

## 2023-04-18 MED ORDER — SODIUM CHLORIDE (PF) 0.9 % IJ SOLN
INTRAMUSCULAR | Status: AC
  Filled 2023-04-18: qty 50

## 2023-04-18 MED ORDER — SODIUM CHLORIDE 0.9 % IV SOLN: INTRAVENOUS | Status: DC

## 2023-04-18 MED ORDER — BUPIVACAINE LIPOSOME 1.3 % IJ SUSP
INTRAMUSCULAR | Status: DC | PRN
  Administered 2023-04-18: 20 mL

## 2023-04-18 MED ORDER — POLYETHYLENE GLYCOL 3350 17 G PO PACK
17.0000 g | PACK | Freq: Every day | ORAL | Status: DC | PRN
  Administered 2023-04-20: 17 g via ORAL
  Filled 2023-04-18: qty 1

## 2023-04-18 MED ORDER — FENTANYL CITRATE PF 50 MCG/ML IJ SOSY
PREFILLED_SYRINGE | INTRAMUSCULAR | Status: AC
  Administered 2023-04-18: 50 ug via INTRAVENOUS
  Filled 2023-04-18: qty 1

## 2023-04-18 MED ORDER — OXYCODONE HCL 5 MG/5ML PO SOLN: 5.0000 mg | Freq: Once | ORAL | Status: AC | PRN

## 2023-04-18 MED ORDER — CEFAZOLIN SODIUM-DEXTROSE 2-4 GM/100ML-% IV SOLN
2.0000 g | INTRAVENOUS | Status: AC
  Administered 2023-04-18: 2 g via INTRAVENOUS
  Filled 2023-04-18: qty 100

## 2023-04-18 MED ORDER — DEXAMETHASONE SODIUM PHOSPHATE 10 MG/ML IJ SOLN
10.0000 mg | Freq: Once | INTRAMUSCULAR | Status: AC
  Administered 2023-04-19: 10 mg via INTRAVENOUS
  Filled 2023-04-18: qty 1

## 2023-04-18 MED ORDER — PROPOFOL 10 MG/ML IV BOLUS
INTRAVENOUS | Status: DC | PRN
Start: 2023-04-18 — End: 2023-04-18
  Administered 2023-04-18: 10 mg via INTRAVENOUS
  Administered 2023-04-18: 100 mg via INTRAVENOUS
  Administered 2023-04-18: 10 mg via INTRAVENOUS

## 2023-04-18 MED ORDER — ACETAMINOPHEN 10 MG/ML IV SOLN
1000.0000 mg | Freq: Four times a day (QID) | INTRAVENOUS | Status: DC
  Administered 2023-04-18: 1000 mg via INTRAVENOUS
  Filled 2023-04-18: qty 100

## 2023-04-18 MED ORDER — PROPOFOL 500 MG/50ML IV EMUL
INTRAVENOUS | Status: DC | PRN
  Administered 2023-04-18: 100 ug/kg/min via INTRAVENOUS

## 2023-04-18 MED ORDER — BUPIVACAINE IN DEXTROSE 0.75-8.25 % IT SOLN
INTRATHECAL | Status: DC | PRN
  Administered 2023-04-18: 1.4 mL via INTRATHECAL

## 2023-04-18 MED ORDER — ONDANSETRON HCL 4 MG/2ML IJ SOLN
INTRAMUSCULAR | Status: DC | PRN
  Administered 2023-04-18: 4 mg via INTRAVENOUS

## 2023-04-18 MED ORDER — DOCUSATE SODIUM 100 MG PO CAPS
100.0000 mg | ORAL_CAPSULE | Freq: Two times a day (BID) | ORAL | Status: DC
  Administered 2023-04-18 – 2023-04-20 (×5): 100 mg via ORAL
  Filled 2023-04-18 (×5): qty 1

## 2023-04-18 MED ORDER — ONDANSETRON HCL 4 MG/2ML IJ SOLN: 4.0000 mg | Freq: Once | INTRAMUSCULAR | Status: DC | PRN

## 2023-04-18 MED ORDER — OXYCODONE HCL 5 MG PO TABS
ORAL_TABLET | ORAL | Status: AC
  Filled 2023-04-18: qty 1

## 2023-04-18 MED ORDER — DEXAMETHASONE SODIUM PHOSPHATE 10 MG/ML IJ SOLN
8.0000 mg | Freq: Once | INTRAMUSCULAR | Status: AC
  Administered 2023-04-18: 8 mg via INTRAVENOUS

## 2023-04-18 MED ORDER — CHLORHEXIDINE GLUCONATE 0.12 % MT SOLN
15.0000 mL | Freq: Once | OROMUCOSAL | Status: AC
  Administered 2023-04-18: 15 mL via OROMUCOSAL

## 2023-04-18 MED ORDER — ROPIVACAINE HCL 7.5 MG/ML IJ SOLN
INTRAMUSCULAR | Status: DC | PRN
  Administered 2023-04-18: 20 mL via PERINEURAL

## 2023-04-18 MED ORDER — FENTANYL CITRATE PF 50 MCG/ML IJ SOSY
PREFILLED_SYRINGE | INTRAMUSCULAR | Status: AC
  Administered 2023-04-18: 50 ug via INTRAVENOUS
  Filled 2023-04-18: qty 2

## 2023-04-18 MED ORDER — SODIUM CHLORIDE 0.9 % IR SOLN
Status: DC | PRN
  Administered 2023-04-18: 1000 mL

## 2023-04-18 MED ORDER — FENTANYL CITRATE (PF) 100 MCG/2ML IJ SOLN
INTRAMUSCULAR | Status: AC
  Filled 2023-04-18: qty 2

## 2023-04-18 MED ORDER — ACETAMINOPHEN 500 MG PO TABS
1000.0000 mg | ORAL_TABLET | Freq: Four times a day (QID) | ORAL | Status: AC
  Administered 2023-04-18 – 2023-04-19 (×3): 1000 mg via ORAL
  Filled 2023-04-18 (×3): qty 2

## 2023-04-18 MED ORDER — ONDANSETRON HCL 4 MG/2ML IJ SOLN
4.0000 mg | Freq: Four times a day (QID) | INTRAMUSCULAR | Status: DC | PRN
  Administered 2023-04-18: 4 mg via INTRAVENOUS
  Filled 2023-04-18: qty 2

## 2023-04-18 MED ORDER — BISACODYL 10 MG RE SUPP: 10.0000 mg | Freq: Every day | RECTAL | Status: DC | PRN

## 2023-04-18 MED ORDER — ACETAMINOPHEN 500 MG PO TABS
1000.0000 mg | ORAL_TABLET | Freq: Four times a day (QID) | ORAL | Status: DC
  Filled 2023-04-18: qty 2

## 2023-04-18 MED ORDER — MENTHOL 3 MG MT LOZG: 1.0000 | LOZENGE | OROMUCOSAL | Status: DC | PRN

## 2023-04-18 MED ORDER — BUPIVACAINE LIPOSOME 1.3 % IJ SUSP: 20.0000 mL | Freq: Once | INTRAMUSCULAR | Status: DC

## 2023-04-18 MED ORDER — 0.9 % SODIUM CHLORIDE (POUR BTL) OPTIME
TOPICAL | Status: DC | PRN
  Administered 2023-04-18: 1000 mL

## 2023-04-18 SURGICAL SUPPLY — 59 items
ADH SKN CLS APL DERMABOND .7 (GAUZE/BANDAGES/DRESSINGS) ×1
ATTUNE MED DOME PAT 38 KNEE (Knees) IMPLANT
ATTUNE PS FEM LT SZ 5 CEM KNEE (Femur) IMPLANT
ATTUNE PSRP INSR SZ5 6 KNEE (Insert) IMPLANT
BAG COUNTER SPONGE SURGICOUNT (BAG) IMPLANT
BAG SPEC THK2 15X12 ZIP CLS (MISCELLANEOUS) ×1
BAG SPNG CNTER NS LX DISP (BAG)
BAG ZIPLOCK 12X15 (MISCELLANEOUS) ×1 IMPLANT
BASE TIBIAL ROT PLAT SZ 5 KNEE (Knees) IMPLANT
BLADE SAG 18X100X1.27 (BLADE) ×1 IMPLANT
BLADE SAW SGTL 11.0X1.19X90.0M (BLADE) ×1 IMPLANT
BNDG CMPR 5X62 HK CLSR LF (GAUZE/BANDAGES/DRESSINGS) ×1
BNDG CMPR 6 X 5 YARDS HK CLSR (GAUZE/BANDAGES/DRESSINGS) ×1
BNDG CMPR 6"X 5 YARDS HK CLSR (GAUZE/BANDAGES/DRESSINGS) ×1
BNDG CMPR MED 10X6 ELC LF (GAUZE/BANDAGES/DRESSINGS) ×1
BNDG ELASTIC 6INX 5YD STR LF (GAUZE/BANDAGES/DRESSINGS) ×1 IMPLANT
BNDG ELASTIC 6X10 VLCR STRL LF (GAUZE/BANDAGES/DRESSINGS) IMPLANT
BOWL SMART MIX CTS (DISPOSABLE) ×1 IMPLANT
BSPLAT TIB 5 CMNT ROT PLAT STR (Knees) ×1 IMPLANT
CEMENT HV SMART SET (Cement) ×2 IMPLANT
COVER SURGICAL LIGHT HANDLE (MISCELLANEOUS) ×1 IMPLANT
CUFF TOURN SGL QUICK 34 (TOURNIQUET CUFF) ×1
CUFF TRNQT CYL 34X4.125X (TOURNIQUET CUFF) ×1 IMPLANT
DERMABOND ADVANCED .7 DNX12 (GAUZE/BANDAGES/DRESSINGS) ×1 IMPLANT
DRAPE INCISE IOBAN 66X45 STRL (DRAPES) ×1 IMPLANT
DRAPE U-SHAPE 47X51 STRL (DRAPES) ×1 IMPLANT
DRSG AQUACEL AG ADV 3.5X10 (GAUZE/BANDAGES/DRESSINGS) ×1 IMPLANT
DURAPREP 26ML APPLICATOR (WOUND CARE) ×1 IMPLANT
ELECT REM PT RETURN 15FT ADLT (MISCELLANEOUS) ×1 IMPLANT
GLOVE BIO SURGEON STRL SZ 6.5 (GLOVE) IMPLANT
GLOVE BIO SURGEON STRL SZ8 (GLOVE) ×1 IMPLANT
GLOVE BIOGEL PI IND STRL 6.5 (GLOVE) IMPLANT
GLOVE BIOGEL PI IND STRL 7.0 (GLOVE) IMPLANT
GLOVE BIOGEL PI IND STRL 8 (GLOVE) ×1 IMPLANT
GOWN STRL REUS W/ TWL LRG LVL3 (GOWN DISPOSABLE) ×1 IMPLANT
GOWN STRL REUS W/TWL LRG LVL3 (GOWN DISPOSABLE) ×1
HANDPIECE INTERPULSE COAX TIP (DISPOSABLE) ×1
HOLDER FOLEY CATH W/STRAP (MISCELLANEOUS) IMPLANT
IMMOBILIZER KNEE 20 (SOFTGOODS) ×1
IMMOBILIZER KNEE 20 THIGH 36 (SOFTGOODS) ×1 IMPLANT
KIT TURNOVER KIT A (KITS) IMPLANT
MANIFOLD NEPTUNE II (INSTRUMENTS) ×1 IMPLANT
NS IRRIG 1000ML POUR BTL (IV SOLUTION) ×1 IMPLANT
PACK TOTAL KNEE CUSTOM (KITS) ×1 IMPLANT
PADDING CAST COTTON 6X4 STRL (CAST SUPPLIES) ×2 IMPLANT
PIN STEINMAN FIXATION KNEE (PIN) IMPLANT
PROTECTOR NERVE ULNAR (MISCELLANEOUS) ×1 IMPLANT
SET HNDPC FAN SPRY TIP SCT (DISPOSABLE) ×1 IMPLANT
SPIKE FLUID TRANSFER (MISCELLANEOUS) ×1 IMPLANT
SUT MNCRL AB 4-0 PS2 18 (SUTURE) ×1 IMPLANT
SUT STRATAFIX 0 PDS 27 VIOLET (SUTURE) ×1
SUT VIC AB 2-0 CT1 27 (SUTURE) ×3
SUT VIC AB 2-0 CT1 TAPERPNT 27 (SUTURE) ×3 IMPLANT
SUTURE STRATFX 0 PDS 27 VIOLET (SUTURE) ×1 IMPLANT
TIBIAL BASE ROT PLAT SZ 5 KNEE (Knees) ×1 IMPLANT
TRAY FOLEY MTR SLVR 16FR STAT (SET/KITS/TRAYS/PACK) ×1 IMPLANT
TUBE SUCTION HIGH CAP CLEAR NV (SUCTIONS) ×1 IMPLANT
WATER STERILE IRR 1000ML POUR (IV SOLUTION) ×2 IMPLANT
WRAP KNEE MAXI GEL POST OP (GAUZE/BANDAGES/DRESSINGS) ×1 IMPLANT

## 2023-04-18 NOTE — Anesthesia Postprocedure Evaluation (Signed)
Anesthesia Post Note  Patient: Crystal Little  Procedure(s) Performed: TOTAL KNEE ARTHROPLASTY (Left: Knee)     Patient location during evaluation: PACU Anesthesia Type: Spinal and General Level of consciousness: awake and alert Pain management: pain level controlled Vital Signs Assessment: post-procedure vital signs reviewed and stable Respiratory status: spontaneous breathing, nonlabored ventilation, respiratory function stable and patient connected to nasal cannula oxygen Cardiovascular status: blood pressure returned to baseline and stable Postop Assessment: no apparent nausea or vomiting, no headache, no backache and spinal receding Anesthetic complications: no Comments: Partial spinal, requiring general anesthetic due to block not fully setting up   No notable events documented.  Last Vitals:  Vitals:   04/18/23 1100 04/18/23 1115  BP:  (!) 150/78  Pulse:  77  Resp:  18  Temp: 36.6 C (!) 36.4 C  SpO2:  95%    Last Pain:  Vitals:   04/18/23 1115  TempSrc: Oral  PainSc: 2                  Beryle Lathe

## 2023-04-18 NOTE — Op Note (Signed)
OPERATIVE REPORT-TOTAL KNEE ARTHROPLASTY   Pre-operative diagnosis- Osteoarthritis  Left knee(s)  Post-operative diagnosis- Osteoarthritis Left knee(s)  Procedure-  Left  Total Knee Arthroplasty  Surgeon- Gus Rankin. Sharonda Llamas, MD  Assistant- Weston Brass, PA-C   Anesthesia-  GA combined with regional for post-op pain  EBL-25 mL   Drains None  Tourniquet time-  Total Tourniquet Time Documented: Thigh (Left) - 39 minutes Total: Thigh (Left) - 39 minutes     Complications- None  Condition-PACU - hemodynamically stable.   Brief Clinical Note  Crystal Little is a 82 y.o. year old female with end stage OA of her left knee with progressively worsening pain and dysfunction. She has constant pain, with activity and at rest and significant functional deficits with difficulties even with ADLs. She has had extensive non-op management including analgesics, injections of cortisone and viscosupplements, and home exercise program, but remains in significant pain with significant dysfunction. Radiographs show bone on bone arthritis medial and patellofemoral. She presents now for left Total Knee Arthroplasty.     Procedure in detail---   The patient is brought into the operating room and positioned supine on the operating table. After successful administration of  GA combined with regional for post-op pain,   a tourniquet is placed high on the  Left thigh(s) and the lower extremity is prepped and draped in the usual sterile fashion. Time out is performed by the operating team and then the  Left lower extremity is wrapped in Esmarch, knee flexed and the tourniquet inflated to 300 mmHg.       A midline incision is made with a ten blade through the subcutaneous tissue to the level of the extensor mechanism. A fresh blade is used to make a medial parapatellar arthrotomy. Soft tissue over the proximal medial tibia is subperiosteally elevated to the joint line with a knife and into the  semimembranosus bursa with a Cobb elevator. Soft tissue over the proximal lateral tibia is elevated with attention being paid to avoiding the patellar tendon on the tibial tubercle. The patella is everted, knee flexed 90 degrees and the ACL and PCL are removed. Findings are bone on bone medial and patellofemoral with massive global osteophytes        The drill is used to create a starting hole in the distal femur and the canal is thoroughly irrigated with sterile saline to remove the fatty contents. The 5 degree Left  valgus alignment guide is placed into the femoral canal and the distal femoral cutting block is pinned to remove 9 mm off the distal femur. Resection is made with an oscillating saw.      The tibia is subluxed forward and the menisci are removed. The extramedullary alignment guide is placed referencing proximally at the medial aspect of the tibial tubercle and distally along the second metatarsal axis and tibial crest. The block is pinned to remove 2mm off the more deficient medial  side. Resection is made with an oscillating saw. Size 5is the most appropriate size for the tibia and the proximal tibia is prepared with the modular drill and keel punch for that size.      The femoral sizing guide is placed and size 5 is most appropriate. Rotation is marked off the epicondylar axis and confirmed by creating a rectangular flexion gap at 90 degrees. The size 5 cutting block is pinned in this rotation and the anterior, posterior and chamfer cuts are made with the oscillating saw. The intercondylar block is then placed and  that cut is made.      Trial size 5 tibial component, trial size 5 posterior stabilized femur and a 6  mm posterior stabilized rotating platform insert trial is placed. Full extension is achieved with excellent varus/valgus and anterior/posterior balance throughout full range of motion. The patella is everted and thickness measured to be 24  mm. Free hand resection is taken to 14 mm, a  38 template is placed, lug holes are drilled, trial patella is placed, and it tracks normally. Osteophytes are removed off the posterior femur with the trial in place. All trials are removed and the cut bone surfaces prepared with pulsatile lavage. Cement is mixed and once ready for implantation, the size 5 tibial implant, size  5 posterior stabilized femoral component, and the size 38 patella are cemented in place and the patella is held with the clamp. The trial insert is placed and the knee held in full extension. The Exparel (20 ml mixed with 60 ml saline) is injected into the extensor mechanism, posterior capsule, medial and lateral gutters and subcutaneous tissues.  All extruded cement is removed and once the cement is hard the permanent 6 mm posterior stabilized rotating platform insert is placed into the tibial tray.      The wound is copiously irrigated with saline solution and the extensor mechanism closed with # 0 Stratofix suture. The tourniquet is released for a total tourniquet time of 39  minutes. Flexion against gravity is 130 degrees and the patella tracks normally. Subcutaneous tissue is closed with 2.0 vicryl and subcuticular with running 4.0 Monocryl. The incision is cleaned and dried and steri-strips and a bulky sterile dressing are applied. The limb is placed into a knee immobilizer and the patient is awakened and transported to recovery in stable condition.      Please note that a surgical assistant was a medical necessity for this procedure in order to perform it in a safe and expeditious manner. Surgical assistant was necessary to retract the ligaments and vital neurovascular structures to prevent injury to them and also necessary for proper positioning of the limb to allow for anatomic placement of the prosthesis.   Gus Rankin Christeena Krogh, MD    04/18/2023, 8:35 AM

## 2023-04-18 NOTE — Anesthesia Procedure Notes (Signed)
Procedure Name: MAC Date/Time: 04/18/2023 7:09 AM  Performed by: Orest Dikes, CRNAPre-anesthesia Checklist: Patient identified, Emergency Drugs available, Suction available and Patient being monitored Oxygen Delivery Method: Simple face mask

## 2023-04-18 NOTE — Plan of Care (Signed)
  Problem: Activity: Goal: Ability to avoid complications of mobility impairment will improve Outcome: Progressing Goal: Range of joint motion will improve Outcome: Progressing   Problem: Pain Management: Goal: Pain level will decrease with appropriate interventions Outcome: Progressing   Problem: Safety: Goal: Ability to remain free from injury will improve Outcome: Progressing   

## 2023-04-18 NOTE — Progress Notes (Signed)
Orthopedic Tech Progress Note Patient Details:  Crystal Little 1941/07/05 [id number] Applied CPM on patient at 9:20 am per order.  CPM Left Knee CPM Left Knee: On Left Knee Flexion (Degrees): 10 Left Knee Extension (Degrees): 40  Post Interventions Patient Tolerated: Well Instructions Provided: Adjustment of device, Care of device Ortho Devices Type of Ortho Device: CPM padding Ortho Device/Splint Location: LLE Ortho Device/Splint Interventions: Ordered, Application, Adjustment   Post Interventions Patient Tolerated: Well Instructions Provided: Adjustment of device, Care of device  Blase Mess 04/18/2023, 9:42 AM

## 2023-04-18 NOTE — Transfer of Care (Signed)
Immediate Anesthesia Transfer of Care Note  Patient: REETA KUK  Procedure(s) Performed: TOTAL KNEE ARTHROPLASTY (Left: Knee)  Patient Location: PACU  Anesthesia Type:General  Level of Consciousness: awake, alert , and oriented  Airway & Oxygen Therapy: Patient Spontanous Breathing and Patient connected to face mask oxygen  Post-op Assessment: Report given to RN and Post -op Vital signs reviewed and stable  Post vital signs: Reviewed and stable  Last Vitals:  Vitals Value Taken Time  BP 148/75 04/18/23 0856  Temp    Pulse 84 04/18/23 0857  Resp 17 04/18/23 0857  SpO2 97 % 04/18/23 0857  Vitals shown include unfiled device data.  Last Pain:  Vitals:   04/18/23 0613  TempSrc: Oral  PainSc:          Complications: No notable events documented.

## 2023-04-18 NOTE — Interval H&P Note (Signed)
History and Physical Interval Note:  04/18/2023 6:29 AM  Crystal Little  has presented today for surgery, with the diagnosis of left knee osteoarthritis.  The various methods of treatment have been discussed with the patient and family. After consideration of risks, benefits and other options for treatment, the patient has consented to  Procedure(s): TOTAL KNEE ARTHROPLASTY (Left) as a surgical intervention.  The patient's history has been reviewed, patient examined, no change in status, stable for surgery.  I have reviewed the patient's chart and labs.  Questions were answered to the patient's satisfaction.     Homero Fellers Seletha Zimmermann

## 2023-04-18 NOTE — Care Plan (Signed)
Ortho Bundle Case Management Note  Patient Details  Name: Crystal Little MRN: [id number] Date of Birth: 1940/12/30                  L TKA on 04-18-23  DCP: Home with dtr-in-law and granddtr  DME: No needs  PT: EO on 04-21-23 at 2:45pm   DME Arranged:  N/A DME Agency:      Additional Comments: Please contact me with any questions of if this plan should need to change.   Ennis Forts, RN,CCM [facility]  [phone] 04/18/2023, 11:50 AM

## 2023-04-18 NOTE — Anesthesia Procedure Notes (Signed)
Spinal  Patient location during procedure: OR Start time: 04/18/2023 7:12 AM End time: 04/18/2023 7:16 AM Reason for block: surgical anesthesia Staffing Performed: anesthesiologist  Anesthesiologist: Beryle Lathe, MD Performed by: Beryle Lathe, MD Authorized by: Beryle Lathe, MD   Preanesthetic Checklist Completed: patient identified, IV checked, risks and benefits discussed, surgical consent, monitors and equipment checked, pre-op evaluation and timeout performed Spinal Block Patient position: sitting Prep: DuraPrep Patient monitoring: heart rate, cardiac monitor, continuous pulse ox and blood pressure Approach: midline Location: L3-4 Injection technique: single-shot Needle Needle type: Quincke  Needle gauge: 22 G Additional Notes Consent was obtained prior to the procedure with all questions answered and concerns addressed. Risks including, but not limited to, bleeding, infection, nerve damage, paralysis, failed block, inadequate analgesia, allergic reaction, high spinal, itching, and headache were discussed and the patient wished to proceed. Functioning IV was confirmed and monitors were applied. Sterile prep and drape, including hand hygiene, mask, and sterile gloves were used. The patient was positioned and the spine was prepped. The skin was anesthetized with lidocaine. Free flow of clear CSF was obtained prior to injecting local anesthetic into the CSF. The spinal needle aspirated freely following injection. The needle was carefully withdrawn. The patient tolerated the procedure well.   Leslye Peer, MD

## 2023-04-18 NOTE — Evaluation (Signed)
Physical Therapy Evaluation Patient Details Name: Crystal Little MRN: [id number] DOB: Mar 23, 1941 Today's Date: 04/18/2023  History of Present Illness  82 yo female s/p L TKA on 04/18/23. PMH: OA, plantar fasciitis, sciatica, back surgery  Clinical Impression  Pt is s/p TKA resulting in the deficits listed below (see PT Problem List).  Pt amb ~ 6' with RW and min-mod assist. Limited by pain and nausea; continue PT in acute setting  Pt will benefit from acute skilled PT to increase their independence and safety with mobility to allow discharge.          Assistance Recommended at Discharge Intermittent Supervision/Assistance  If plan is discharge home, recommend the following:  Can travel by private vehicle  A little help with walking and/or transfers;A little help with bathing/dressing/bathroom;Assistance with cooking/housework;Assist for transportation;Help with stairs or ramp for entrance        Equipment Recommendations None recommended by PT  Recommendations for Other Services       Functional Status Assessment Patient has had a recent decline in their functional status and demonstrates the ability to make significant improvements in function in a reasonable and predictable amount of time.     Precautions / Restrictions Precautions Precautions: Knee;Fall Required Braces or Orthoses: Knee Immobilizer - Left Knee Immobilizer - Left: Discontinue once straight leg raise with < 10 degree lag Restrictions Weight Bearing Restrictions: No Other Position/Activity Restrictions: WBAT      Mobility  Bed Mobility Overal bed mobility: Needs Assistance Bed Mobility: Supine to Sit     Supine to sit: Min assist     General bed mobility comments: assist with LLE    Transfers Overall transfer level: Needs assistance Equipment used: Rolling walker (2 wheels) Transfers: Sit to/from Stand Sit to Stand: Min assist, Mod assist, From elevated surface           General  transfer comment: cues for hand and LLE position, assist to rise and transition to RW    Ambulation/Gait Ambulation/Gait assistance: Min assist Gait Distance (Feet): 6 Feet Assistive device: Rolling walker (2 wheels) Gait Pattern/deviations: Step-to pattern, Antalgic       General Gait Details: cues for sequence and RW position, incr time, heavily reliant on UEs  Stairs            Wheelchair Mobility     Tilt Bed    Modified Rankin (Stroke Patients Only)       Balance                                             Pertinent Vitals/Pain Pain Assessment Pain Assessment: 0-10 Pain Score: 10-Worst pain ever Pain Location: L knee Pain Descriptors / Indicators: Aching, Grimacing, Guarding Pain Intervention(s): Limited activity within patient's tolerance, Monitored during session, Premedicated before session, Repositioned    Home Living Family/patient expects to be discharged to:: Private residence Living Arrangements: Alone   Type of Home: Other(Comment) (condo) Home Access: Stairs to enter   [employer] of Steps: 2   Home Layout: One level Home Equipment: Agricultural consultant (2 wheels);Cane - single point      Prior Function Prior Level of Function : Independent/Modified Independent             Mobility Comments: amb with cane longer distances       Hand Dominance        Extremity/Trunk Assessment  Lower Extremity Assessment Lower Extremity Assessment: LLE deficits/detail LLE Deficits / Details: ankle WFL, knee and hip grossly 2/5       Communication   Communication: No difficulties  Cognition Arousal/Alertness: Awake/alert Behavior During Therapy: WFL for tasks assessed/performed Overall Cognitive Status: Within Functional Limits for tasks assessed                                          General Comments      Exercises Total Joint Exercises Ankle Circles/Pumps: AROM, Both, 5  reps Other Exercises Other Exercises: IS x5   Assessment/Plan    PT Assessment Patient needs continued PT services  PT Problem List Decreased strength;Decreased activity tolerance;Decreased mobility;Pain;Decreased knowledge of use of DME;Decreased knowledge of precautions;Decreased range of motion       PT Treatment Interventions DME instruction;Therapeutic exercise;Gait training;Functional mobility training;Therapeutic activities;Patient/family education;Stair training    PT Goals (Current goals can be found in the Care Plan section)  Acute Rehab PT Goals PT Goal Formulation: With patient Time For Goal Achievement: 04/25/23 Potential to Achieve Goals: Good    Frequency 7X/week     Co-evaluation               AM-PAC PT "6 Clicks" Mobility  Outcome Measure Help needed turning from your back to your side while in a flat bed without using bedrails?: A Little Help needed moving from lying on your back to sitting on the side of a flat bed without using bedrails?: A Little Help needed moving to and from a bed to a chair (including a wheelchair)?: A Little Help needed standing up from a chair using your arms (e.g., wheelchair or bedside chair)?: A Little Help needed to walk in hospital room?: A Lot Help needed climbing 3-5 steps with a railing? : A Lot 6 Click Score: 16    End of Session Equipment Utilized During Treatment: Gait belt;Left knee immobilizer Activity Tolerance: Patient limited by fatigue;Other (comment) (nausea) Patient left: in chair;with call bell/phone within reach;with chair alarm set;with family/visitor present Nurse Communication: Mobility status PT Visit Diagnosis: Other abnormalities of gait and mobility (R26.89);Difficulty in walking, not elsewhere classified (R26.2)    Time: 8295-6213 PT Time Calculation (min) (ACUTE ONLY): 29 min   Charges:   PT Evaluation $PT Eval Low Complexity: 1 Low PT Treatments $Gait Training: 8-22 mins PT General  Charges $$ ACUTE PT VISIT: 1 Visit         Annette Bertelson, PT  Acute Rehab Dept [facility]) [phone]  04/18/2023   [facility] 04/18/2023, 3:46 PM

## 2023-04-18 NOTE — Anesthesia Procedure Notes (Signed)
Anesthesia Regional Block: Adductor canal block   Pre-Anesthetic Checklist: , timeout performed,  Correct Patient, Correct Site, Correct Laterality,  Correct Procedure, Correct Position, site marked,  Risks and benefits discussed,  Surgical consent,  Pre-op evaluation,  At surgeon's request and post-op pain management  Laterality: Left  Prep: chloraprep       Needles:  Injection technique: Single-shot  Needle Type: Echogenic Needle     Needle Length: 10cm  Needle Gauge: 21     Additional Needles:   Narrative:  Start time: 04/18/2023 6:57 AM End time: 04/18/2023 7:00 AM Injection made incrementally with aspirations every 5 mL.  Performed by: Personally  Anesthesiologist: Beryle Lathe, MD  Additional Notes: No pain on injection. No increased resistance to injection. Injection made in 5cc increments. Good needle visualization. Patient tolerated the procedure well.

## 2023-04-18 NOTE — Discharge Instructions (Signed)
Crystal Arabian, MD Total Joint Specialist [facility] [address], [address] [place], [place] [address] [phone]  TOTAL KNEE REPLACEMENT POSTOPERATIVE DIRECTIONS    Knee Rehabilitation, Guidelines Following Surgery  Results after knee surgery are often greatly improved when you follow the exercise, range of motion and muscle strengthening exercises prescribed by your doctor. Safety measures are also important to protect the knee from further injury. If any of these exercises cause you to have increased pain or swelling in your knee joint, decrease the amount until you are comfortable again and slowly increase them. If you have problems or questions, call your caregiver or physical therapist for advice.   BLOOD CLOT PREVENTION Take an 81 mg Aspirin two times a day for three weeks following surgery. Then take an 81 mg Aspirin once a day for three weeks. Then discontinue Aspirin. You may resume your vitamins/supplements upon discharge from the hospital. Do not take any NSAIDs (Advil, Aleve, Ibuprofen, Meloxicam, etc.) until you are 3 weeks out from surgery.  HOME CARE INSTRUCTIONS  Remove items at home which could result in a fall. This includes throw rugs or furniture in walking pathways.  ICE to the affected knee as much as tolerated. Icing helps control swelling. If the swelling is well controlled you will be more comfortable and rehab easier. Continue to use ice on the knee for pain and swelling from surgery. You may notice swelling that will progress down to the foot and ankle. This is normal after surgery. Elevate the leg when you are not up walking on it.    Continue to use the breathing machine which will help keep your temperature down. It is common for your temperature to cycle up and down following surgery, especially at night when you are not up moving around and exerting yourself. The breathing machine keeps your lungs expanded and your temperature down. Do  not place pillow under the operative knee, focus on keeping the knee straight while resting  DIET You may resume your previous home diet once you are discharged from the hospital.  DRESSING / WOUND CARE / SHOWERING Keep your bulky bandage on for 2 days. On the third post-operative day you may remove the Ace bandage and gauze. There is a waterproof adhesive bandage on your skin which will stay in place until your first follow-up appointment. Once you remove this you will not need to place another bandage You may begin showering 3 days following surgery, but do not submerge the incision under water.  ACTIVITY For the first 5 days, the key is rest and control of pain and swelling Do your home exercises twice a day starting on post-operative day 3. On the days you go to physical therapy, just do the home exercises once that day. You should rest, ice and elevate the leg for 50 minutes out of every hour. Get up and walk/stretch for 10 minutes per hour. After 5 days you can increase your activity slowly as tolerated. Walk with your walker as instructed. Use the walker until you are comfortable transitioning to a cane. Walk with the cane in the opposite hand of the operative leg. You may discontinue the cane once you are comfortable and walking steadily. Avoid periods of inactivity such as sitting longer than an hour when not asleep. This helps prevent blood clots.  You may discontinue the knee immobilizer once you are able to perform a straight leg raise while lying down. You may resume a sexual relationship in one month  or when given the OK by your doctor.  You may return to work once you are cleared by your doctor.  Do not drive a car for 6 weeks or until released by your surgeon.  Do not drive while taking narcotics.  TED HOSE STOCKINGS Wear the elastic stockings on both legs for three weeks following surgery during the day. You may remove them at night for sleeping.  WEIGHT BEARING Weight  bearing as tolerated with assist device (walker, cane, etc) as directed, use it as long as suggested by your surgeon or therapist, typically at least 4-6 weeks.  POSTOPERATIVE CONSTIPATION PROTOCOL Constipation - defined medically as fewer than three stools per week and severe constipation as less than one stool per week.  One of the most common issues patients have following surgery is constipation.  Even if you have a regular bowel pattern at home, your normal regimen is likely to be disrupted due to multiple reasons following surgery.  Combination of anesthesia, postoperative narcotics, change in appetite and fluid intake all can affect your bowels.  In order to avoid complications following surgery, here are some recommendations in order to help you during your recovery period.  Colace (docusate) - Pick up an over-the-counter form of Colace or another stool softener and take twice a day as long as you are requiring postoperative pain medications.  Take with a full glass of water daily.  If you experience loose stools or diarrhea, hold the colace until you stool forms back up. If your symptoms do not get better within 1 week or if they get worse, check with your doctor. Dulcolax (bisacodyl) - Pick up over-the-counter and take as directed by the product packaging as needed to assist with the movement of your bowels.  Take with a full glass of water.  Use this product as needed if not relieved by Colace only.  MiraLax (polyethylene glycol) - Pick up over-the-counter to have on hand. MiraLax is a solution that will increase the amount of water in your bowels to assist with bowel movements.  Take as directed and can mix with a glass of water, juice, soda, coffee, or tea. Take if you go more than two days without a movement. Do not use MiraLax more than once per day. Call your doctor if you are still constipated or irregular after using this medication for 7 days in a row.  If you continue to have problems  with postoperative constipation, please contact the office for further assistance and recommendations.  If you experience "the worst abdominal pain ever" or develop nausea or vomiting, please contact the office immediatly for further recommendations for treatment.  ITCHING If you experience itching with your medications, try taking only a single pain pill, or even half a pain pill at a time.  You can also use Benadryl over the counter for itching or also to help with sleep.   MEDICATIONS See your medication summary on the "After Visit Summary" that the nursing staff will review with you prior to discharge.  You may have some home medications which will be placed on hold until you complete the course of blood thinner medication.  It is important for you to complete the blood thinner medication as prescribed by your surgeon.  Continue your approved medications as instructed at time of discharge.  PRECAUTIONS If you experience chest pain or shortness of breath - call 911 immediately for transfer to the hospital emergency department.  If you develop a fever greater that 101 F,  purulent drainage from wound, increased redness or drainage from wound, foul odor from the wound/dressing, or calf pain - CONTACT YOUR SURGEON.                                                   FOLLOW-UP APPOINTMENTS Make sure you keep all of your appointments after your operation with your surgeon and caregivers. You should call the office at the above phone number and make an appointment for approximately two weeks after the date of your surgery or on the date instructed by your surgeon outlined in the "After Visit Summary".  RANGE OF MOTION AND STRENGTHENING EXERCISES  Rehabilitation of the knee is important following a knee injury or an operation. After just a few days of immobilization, the muscles of the thigh which control the knee become weakened and shrink (atrophy). Knee exercises are designed to build up the tone and  strength of the thigh muscles and to improve knee motion. Often times heat used for twenty to thirty minutes before working out will loosen up your tissues and help with improving the range of motion but do not use heat for the first two weeks following surgery. These exercises can be done on a training (exercise) mat, on the floor, on a table or on a bed. Use what ever works the best and is most comfortable for you Knee exercises include:  Leg Lifts - While your knee is still immobilized in a splint or cast, you can do straight leg raises. Lift the leg to 60 degrees, hold for 3 sec, and slowly lower the leg. Repeat 10-20 times 2-3 times daily. Perform this exercise against resistance later as your knee gets better.  Quad and Hamstring Sets - Tighten up the muscle on the front of the thigh (Quad) and hold for 5-10 sec. Repeat this 10-20 times hourly. Hamstring sets are done by pushing the foot backward against an object and holding for 5-10 sec. Repeat as with quad sets.  Leg Slides: Lying on your back, slowly slide your foot toward your buttocks, bending your knee up off the floor (only go as far as is comfortable). Then slowly slide your foot back down until your leg is flat on the floor again. Angel Wings: Lying on your back spread your legs to the side as far apart as you can without causing discomfort.  A rehabilitation program following serious knee injuries can speed recovery and prevent re-injury in the future due to weakened muscles. Contact your doctor or a physical therapist for more information on knee rehabilitation.   POST-OPERATIVE OPIOID TAPER INSTRUCTIONS: It is important to wean off of your opioid medication as soon as possible. If you do not need pain medication after your surgery it is ok to stop day one. Opioids include: Codeine, Hydrocodone(Norco, Vicodin), Oxycodone(Percocet, oxycontin) and hydromorphone amongst others.  Long term and even short term use of opiods can  cause: Increased pain response Dependence Constipation Depression Respiratory depression And more.  Withdrawal symptoms can include Flu like symptoms Nausea, vomiting And more Techniques to manage these symptoms Hydrate well Eat regular healthy meals Stay active Use relaxation techniques(deep breathing, meditating, yoga) Do Not substitute Alcohol to help with tapering If you have been on opioids for less than two weeks and do not have pain than it is ok to stop all together.  Plan  to wean off of opioids This plan should start within one week post op of your joint replacement. Maintain the same interval or time between taking each dose and first decrease the dose.  Cut the total daily intake of opioids by one tablet each day Next start to increase the time between doses. The last dose that should be eliminated is the evening dose.   IF YOU ARE TRANSFERRED TO A SKILLED REHAB FACILITY If the patient is transferred to a skilled rehab facility following release from the hospital, a list of the current medications will be sent to the facility for the patient to continue.  When discharged from the skilled rehab facility, please have the facility set up the patient's Home Health Physical Therapy prior to being released. Also, the skilled facility will be responsible for providing the patient with their medications at time of release from the facility to include their pain medication, the muscle relaxants, and their blood thinner medication. If the patient is still at the rehab facility at time of the two week follow up appointment, the skilled rehab facility will also need to assist the patient in arranging follow up appointment in our office and any transportation needs.  MAKE SURE YOU:  Understand these instructions.  Get help right away if you are not doing well or get worse.   DENTAL ANTIBIOTICS:  In most cases prophylactic antibiotics for Dental procdeures after total joint surgery are  not necessary.  Exceptions are as follows:  1. History of prior total joint infection  2. Severely immunocompromised (Organ Transplant, cancer chemotherapy, Rheumatoid biologic meds such as Humera)  3. Poorly controlled diabetes (A1C &gt; 8.0, blood glucose over 200)  If you have one of these conditions, contact your surgeon for an antibiotic prescription, prior to your dental procedure.    Pick up stool softner and laxative for home use following surgery while on pain medications. Do not submerge incision under water. Please use good hand washing techniques while changing dressing each day. May shower starting three days after surgery. Please use a clean towel to pat the incision dry following showers. Continue to use ice for pain and swelling after surgery. Do not use any lotions or creams on the incision until instructed by your surgeon.  

## 2023-04-18 NOTE — Progress Notes (Signed)
Orthopedic Tech Progress Note Patient Details:  Crystal Little Jun 20, 1941 [id number] CPM has been taken off and ice packs were applied to the knee.  CPM Left Knee CPM Left Knee: On Left Knee Flexion (Degrees): 10 Left Knee Extension (Degrees): 40  Post Interventions Patient Tolerated: Well Instructions Provided: Adjustment of device, Care of device  Zuleyma Scharf E Yasmine Kilbourne 04/18/2023, 1:46 PM

## 2023-04-18 NOTE — Anesthesia Procedure Notes (Signed)
Procedure Name: LMA Insertion Date/Time: 04/18/2023 7:37 AM  Performed by: Orest Dikes, CRNAPre-anesthesia Checklist: Patient identified, Emergency Drugs available, Suction available and Patient being monitored Patient Re-evaluated:Patient Re-evaluated prior to induction Oxygen Delivery Method: Circle system utilized Preoxygenation: Pre-oxygenation with 100% oxygen Induction Type: IV induction LMA: LMA with gastric port inserted LMA Size: 4.0 Number of attempts: 1 Placement Confirmation: positive ETCO2 and breath sounds checked- equal and bilateral Tube secured with: Tape Dental Injury: Teeth and Oropharynx as per pre-operative assessment

## 2023-04-19 ENCOUNTER — Encounter (HOSPITAL_COMMUNITY): Payer: Self-pay | Admitting: Orthopedic Surgery

## 2023-04-19 DIAGNOSIS — M1712 Unilateral primary osteoarthritis, left knee: Secondary | ICD-10-CM | POA: Diagnosis not present

## 2023-04-19 DIAGNOSIS — K219 Gastro-esophageal reflux disease without esophagitis: Secondary | ICD-10-CM | POA: Diagnosis not present

## 2023-04-19 DIAGNOSIS — D649 Anemia, unspecified: Secondary | ICD-10-CM | POA: Diagnosis not present

## 2023-04-19 DIAGNOSIS — M5431 Sciatica, right side: Secondary | ICD-10-CM | POA: Diagnosis not present

## 2023-04-19 DIAGNOSIS — R1314 Dysphagia, pharyngoesophageal phase: Secondary | ICD-10-CM | POA: Diagnosis not present

## 2023-04-19 LAB — CBC
HCT: 33.7 % — ABNORMAL LOW (ref 36.0–46.0)
Hemoglobin: 10.5 g/dL — ABNORMAL LOW (ref 12.0–15.0)
MCH: 28.8 pg (ref 26.0–34.0)
MCHC: 31.2 g/dL (ref 30.0–36.0)
MCV: 92.3 fL (ref 80.0–100.0)
Platelets: 276 10*3/uL (ref 150–400)
RBC: 3.65 MIL/uL — ABNORMAL LOW (ref 3.87–5.11)
RDW: 14.5 % (ref 11.5–15.5)
WBC: 14.1 10*3/uL — ABNORMAL HIGH (ref 4.0–10.5)
nRBC: 0 % (ref 0.0–0.2)

## 2023-04-19 LAB — BASIC METABOLIC PANEL
Anion gap: 8 (ref 5–15)
BUN: 11 mg/dL (ref 8–23)
CO2: 24 mmol/L (ref 22–32)
Calcium: 9 mg/dL (ref 8.9–10.3)
Chloride: 104 mmol/L (ref 98–111)
Creatinine, Ser: 0.62 mg/dL (ref 0.44–1.00)
GFR, Estimated: >60 mL/min (ref >60)
Glucose, Bld: 129 mg/dL — ABNORMAL HIGH (ref 70–99)
Potassium: 3.9 mmol/L (ref 3.5–5.1)
Sodium: 136 mmol/L (ref 135–145)

## 2023-04-19 MED ORDER — TRAMADOL HCL 50 MG PO TABS: 50.0000 mg | ORAL_TABLET | Freq: Four times a day (QID) | ORAL | 0 refills | Status: DC | PRN

## 2023-04-19 MED ORDER — OXYCODONE HCL 5 MG PO TABS: 5.0000 mg | ORAL_TABLET | Freq: Four times a day (QID) | ORAL | 0 refills | Status: DC | PRN

## 2023-04-19 MED ORDER — METHOCARBAMOL 500 MG PO TABS: 500.0000 mg | ORAL_TABLET | Freq: Four times a day (QID) | ORAL | 0 refills | Status: DC | PRN

## 2023-04-19 MED ORDER — ONDANSETRON HCL 4 MG PO TABS: 4.0000 mg | ORAL_TABLET | Freq: Four times a day (QID) | ORAL | 0 refills | Status: DC | PRN

## 2023-04-19 MED ORDER — ASPIRIN 81 MG PO CHEW: 81.0000 mg | CHEWABLE_TABLET | Freq: Two times a day (BID) | ORAL | 0 refills | Status: AC

## 2023-04-19 NOTE — Progress Notes (Signed)
Physical Therapy Treatment Patient Details Name: Crystal Little MRN: [id number] DOB: November 02, 1940 Today's Date: 04/19/2023   History of Present Illness 82 yo female s/p L TKA on 04/18/23. PMH: OA, plantar fasciitis, sciatica, back surgery    PT Comments  Pt motivated and is progressing well, improving gait stability and decr assist needed with STS transfers this pm. Pt will benefit from anther day to work on stairs and incr gait/activity tol    Assistance Recommended at Discharge Intermittent Supervision/Assistance  If plan is discharge home, recommend the following:  Can travel by private vehicle    A little help with walking and/or transfers;A little help with bathing/dressing/bathroom;Assistance with cooking/housework;Assist for transportation;Help with stairs or ramp for entrance      Equipment Recommendations  None recommended by PT    Recommendations for Other Services       Precautions / Restrictions Precautions Precautions: Knee;Fall Precaution Comments: pt with improved quad activation, removed KI, no knee buckling Required Braces or Orthoses: Knee Immobilizer - Left Knee Immobilizer - Left: Discontinue once straight leg raise with < 10 degree lag Restrictions Weight Bearing Restrictions: No Other Position/Activity Restrictions: WBAT     Mobility  Bed Mobility               General bed mobility comments:  (in recliner)    Transfers Overall transfer level: Needs assistance Equipment used: Rolling walker (2 wheels) Transfers: Sit to/from Stand Sit to Stand: Mod assist, Min assist           General transfer comment: cues for hand and LLE position, assist to rise and transition to RW    Ambulation/Gait Ambulation/Gait assistance: Min assist Gait Distance (Feet): 34 Feet Assistive device: Rolling walker (2 wheels) Gait Pattern/deviations: Step-to pattern, Decreased step length - right, Decreased step length - left, Trunk flexed       General  Gait Details: cues for sequence, posture and RW position. gait stability improving as well as incr stance time on LLE   Stairs             Wheelchair Mobility     Tilt Bed    Modified Rankin (Stroke Patients Only)       Balance                                            Cognition Arousal/Alertness: Awake/alert Behavior During Therapy: WFL for tasks assessed/performed Overall Cognitive Status: Within Functional Limits for tasks assessed                                          Exercises Total Joint Exercises Ankle Circles/Pumps: AROM, Both, 5 reps Quad Sets: AROM, Strengthening, Both, 5 reps Heel Slides: AAROM, Left, 10 reps Hip ABduction/ADduction: AROM, Left, 10 reps Straight Leg Raises: AAROM, Left, 10 reps    General Comments        Pertinent Vitals/Pain Pain Assessment Pain Assessment: 0-10 Pain Score: 2  Pain Location: L knee Pain Descriptors / Indicators: Aching, Grimacing, Guarding Pain Intervention(s): Limited activity within patient's tolerance, Premedicated before session, Monitored during session, Repositioned, Ice applied    Home Living                          Prior Function  PT Goals (current goals can now be found in the care plan section) Acute Rehab PT Goals PT Goal Formulation: With patient Time For Goal Achievement: 04/25/23 Potential to Achieve Goals: Good Progress towards PT goals: Progressing toward goals    Frequency    7X/week      PT Plan Current plan remains appropriate    Co-evaluation              AM-PAC PT "6 Clicks" Mobility   Outcome Measure  Help needed turning from your back to your side while in a flat bed without using bedrails?: A Little Help needed moving from lying on your back to sitting on the side of a flat bed without using bedrails?: A Little Help needed moving to and from a bed to a chair (including a wheelchair)?: A Little Help  needed standing up from a chair using your arms (e.g., wheelchair or bedside chair)?: A Little Help needed to walk in hospital room?: A Little Help needed climbing 3-5 steps with a railing? : A Lot 6 Click Score: 17    End of Session Equipment Utilized During Treatment: Gait belt;Left knee immobilizer Activity Tolerance: Patient limited by fatigue;Patient tolerated treatment well Patient left: in chair;with call bell/phone within reach;with family/visitor present   PT Visit Diagnosis: Other abnormalities of gait and mobility (R26.89);Difficulty in walking, not elsewhere classified (R26.2)     Time: 1454-1530 PT Time Calculation (min) (ACUTE ONLY): 36 min  Charges:    $Gait Training: 8-22 mins $Therapeutic Exercise: 8-22 mins PT General Charges $$ ACUTE PT VISIT: 1 Visit                     Larsen Zettel, PT  Acute Rehab Dept [facility]) [phone]  04/19/2023    [facility] 04/19/2023, 3:48 PM

## 2023-04-19 NOTE — Progress Notes (Signed)
   Subjective: 1 Day Post-Op Procedure(s) (LRB): TOTAL KNEE ARTHROPLASTY (Left) Patient reports pain as mild.   Patient seen in rounds by Dr. Lequita Halt. Patient had issues with pain yesterday/last night, but improved this morning.  Denies chest pain, SOB, or calf pain. Foley catheter removed this AM.  We will continue therapy today, ambulated 6' yesterday.   Objective: Vital signs in last 24 hours: Temp:  [97 F (36.1 C)-98.1 F (36.7 C)] 98 F (36.7 C) (07/16 0603) Pulse Rate:  [71-95] 71 (07/16 0603) Resp:  [16-20] 17 (07/16 0603) BP: (135-158)/(66-84) 139/66 (07/16 0603) SpO2:  [90 %-100 %] 98 % (07/16 0603)  Intake/Output from previous day:  Intake/Output Summary (Last 24 hours) at 04/19/2023 0837 Last data filed at 04/19/2023 0700 Gross per 24 hour  Intake 1874.29 ml  Output 3850 ml  Net -1975.71 ml     Intake/Output this shift: No intake/output data recorded.  Labs: Recent Labs    04/19/23 0321  HGB 10.5*   Recent Labs    04/19/23 0321  WBC 14.1*  RBC 3.65*  HCT 33.7*  PLT 276   Recent Labs    04/19/23 0321  NA 136  K 3.9  CL 104  CO2 24  BUN 11  CREATININE 0.62  GLUCOSE 129*  CALCIUM 9.0   No results for input(s): "LABPT", "INR" in the last 72 hours.  Exam: General - Patient is Alert and Oriented Extremity - Neurologically intact Neurovascular intact Sensation intact distally Dorsiflexion/Plantar flexion intact Dressing - dressing C/D/I Motor Function - intact, moving foot and toes well on exam.   Past Medical History:  Diagnosis Date   Anemia    Diverticulosis    Frequent UTI    sees Dr. Earlene Plater   GERD (gastroesophageal reflux disease)    Gynecological examination    sees Dr. Artist Pais   IBS (irritable bowel syndrome)    Leg edema    Osteoarthritis    Status post dilation of esophageal narrowing    Varicose veins of legs    sees [facility]     Assessment/Plan: 1 Day Post-Op Procedure(s) (LRB): TOTAL KNEE  ARTHROPLASTY (Left) Principal Problem:   OA (osteoarthritis) of knee Active Problems:   Primary osteoarthritis of left knee  Estimated body mass index is 33.33 kg/m as calculated from the following:   Height as of this encounter: 5' 5.5" (1.664 m).   Weight as of this encounter: 92.3 kg. Advance diet Up with therapy D/C IV fluids   Patient's anticipated LOS is less than 2 midnights, meeting these requirements: - Lives within 1 hour of care - Has a competent adult at home to recover with post-op recover - NO history of  - Chronic pain requiring opioids  - Diabetes  - Coronary Artery Disease  - Heart failure  - Heart attack  - Stroke  - DVT/VTE  - Cardiac arrhythmia  - Respiratory Failure/COPD  - Renal failure  - Anemia  - Advanced Liver disease  DVT Prophylaxis - Aspirin Weight bearing as tolerated. Continue therapy.  Plan is to go Home after hospital stay. Plan for discharge later today if progresses with therapy and meeting goals. Scheduled for OPPT at [facility]. Follow-up in the office in 2 weeks.  The PDMP database was reviewed today prior to any opioid medications being prescribed to this patient.  Arther Abbott, PA-C Orthopedic Surgery [phone] 04/19/2023, 8:37 AM

## 2023-04-19 NOTE — TOC Transition Note (Signed)
Transition of Care [facility]) - CM/SW Discharge Note   Patient Details  Name: Crystal Little MRN: [id number] Date of Birth: 09/05/1941  [facility]) CM/SW Contact:  Amada Jupiter, LCSW Phone Number: 04/19/2023, 10:06 AM   Clinical Narrative:    Met with pt who confirms she has needed DME in the home and OPPT already arranged with [facility]. No TOC needs.   Final next level of care: OP Rehab Barriers to Discharge: No Barriers Identified   Patient Goals and CMS Choice      Discharge Placement                         Discharge Plan and Services Additional resources added to the After Visit Summary for                  DME Arranged: N/A DME Agency: NA                  Social Determinants of Health (SDOH) Interventions SDOH Screenings   Food Insecurity: No Food Insecurity (04/18/2023)  Housing: Low Risk  (04/18/2023)  Transportation Needs: No Transportation Needs (04/18/2023)  Utilities: Not At Risk (04/18/2023)  Alcohol Screen: Low Risk  (06/08/2022)  Depression (PHQ2-9): Low Risk  (06/08/2022)  Recent Concern: Depression (PHQ2-9) - Medium Risk (04/27/2022)  Financial Resource Strain: Low Risk  (06/08/2022)  Physical Activity: Inactive (06/08/2022)  Social Connections: Moderately Integrated (06/08/2022)  Stress: No Stress Concern Present (06/08/2022)  Tobacco Use: Low Risk  (04/18/2023)     Readmission Risk Interventions     No data to display

## 2023-04-19 NOTE — Progress Notes (Signed)
Physical Therapy Treatment Patient Details Name: Crystal Little MRN: [id number] DOB: 04/16/1941 Today's Date: 04/19/2023   History of Present Illness 82 yo female s/p L TKA on 04/18/23. PMH: OA, plantar fasciitis, sciatica, back surgery    PT Comments  Pt progressing this session however continues to fatigue easily and requires incr time and mod assist for STS transfers. May need another day to work with PT pending progress this pm.    Assistance Recommended at Discharge Intermittent Supervision/Assistance  If plan is discharge home, recommend the following:  Can travel by private vehicle    A little help with walking and/or transfers;A little help with bathing/dressing/bathroom;Assistance with cooking/housework;Assist for transportation;Help with stairs or ramp for entrance      Equipment Recommendations  None recommended by PT    Recommendations for Other Services       Precautions / Restrictions Precautions Precautions: Knee;Fall Required Braces or Orthoses: Knee Immobilizer - Left Knee Immobilizer - Left: Discontinue once straight leg raise with < 10 degree lag Restrictions Weight Bearing Restrictions: No Other Position/Activity Restrictions: WBAT     Mobility  Bed Mobility               General bed mobility comments:  (in recliner)    Transfers Overall transfer level: Needs assistance Equipment used: Rolling walker (2 wheels) Transfers: Sit to/from Stand Sit to Stand: Mod assist           General transfer comment: cues for hand and LLE position, assist to rise and transition to RW    Ambulation/Gait Ambulation/Gait assistance: Min assist Gait Distance (Feet): 25 Feet Assistive device: Rolling walker (2 wheels) Gait Pattern/deviations: Step-to pattern, Decreased step length - right, Decreased step length - left, Trunk flexed       General Gait Details: cues for sequence and RW position, incr time, heavily reliant on UEs; 2 brief standing  rests   Stairs             Wheelchair Mobility     Tilt Bed    Modified Rankin (Stroke Patients Only)       Balance                                            Cognition Arousal/Alertness: Awake/alert Behavior During Therapy: WFL for tasks assessed/performed Overall Cognitive Status: Within Functional Limits for tasks assessed                                          Exercises Total Joint Exercises Ankle Circles/Pumps: AROM, Both, 5 reps Quad Sets: AROM, Strengthening, Both, 5 reps Straight Leg Raises: AAROM, Left, 5 reps    General Comments        Pertinent Vitals/Pain Pain Assessment Pain Assessment: 0-10 Pain Score: 3  Pain Location: L knee Pain Descriptors / Indicators: Aching, Grimacing, Guarding Pain Intervention(s): Limited activity within patient's tolerance, Monitored during session, Premedicated before session, Repositioned, Ice applied    Home Living                          Prior Function            PT Goals (current goals can now be found in the care plan section) Acute Rehab PT Goals PT  Goal Formulation: With patient Time For Goal Achievement: 04/25/23 Potential to Achieve Goals: Good Progress towards PT goals: Progressing toward goals    Frequency    7X/week      PT Plan Current plan remains appropriate    Co-evaluation              AM-PAC PT "6 Clicks" Mobility   Outcome Measure  Help needed turning from your back to your side while in a flat bed without using bedrails?: A Little Help needed moving from lying on your back to sitting on the side of a flat bed without using bedrails?: A Little Help needed moving to and from a bed to a chair (including a wheelchair)?: A Little Help needed standing up from a chair using your arms (e.g., wheelchair or bedside chair)?: A Little Help needed to walk in hospital room?: A Little Help needed climbing 3-5 steps with a railing? : A  Lot 6 Click Score: 17    End of Session Equipment Utilized During Treatment: Gait belt;Left knee immobilizer Activity Tolerance: Patient limited by fatigue;Patient tolerated treatment well Patient left: in chair;with call bell/phone within reach;with family/visitor present   PT Visit Diagnosis: Other abnormalities of gait and mobility (R26.89);Difficulty in walking, not elsewhere classified (R26.2)     Time: 6578-4696 PT Time Calculation (min) (ACUTE ONLY): 26 min  Charges:    $Gait Training: 23-37 mins PT General Charges $$ ACUTE PT VISIT: 1 Visit                     Amana Bouska, PT  Acute Rehab Dept [facility]) [phone]  04/19/2023    [facility] 04/19/2023, 11:20 AM

## 2023-04-19 NOTE — Care Management Obs Status (Signed)
MEDICARE OBSERVATION STATUS NOTIFICATION   Patient Details  Name: Crystal Little MRN: [id number] Date of Birth: Oct 11, 1940   Medicare Observation Status Notification Given:  Yes    Amada Jupiter, LCSW 04/19/2023, 11:16 AM

## 2023-04-19 NOTE — Plan of Care (Signed)
  Problem: Education: Goal: Knowledge of the prescribed therapeutic regimen will improve Outcome: Progressing Goal: Individualized Educational Video(s) Outcome: Progressing   Problem: Activity: Goal: Ability to avoid complications of mobility impairment will improve Outcome: Progressing Goal: Range of joint motion will improve Outcome: Progressing   

## 2023-04-20 DIAGNOSIS — M1712 Unilateral primary osteoarthritis, left knee: Secondary | ICD-10-CM | POA: Diagnosis not present

## 2023-04-20 DIAGNOSIS — R1314 Dysphagia, pharyngoesophageal phase: Secondary | ICD-10-CM | POA: Diagnosis not present

## 2023-04-20 DIAGNOSIS — D649 Anemia, unspecified: Secondary | ICD-10-CM | POA: Diagnosis not present

## 2023-04-20 DIAGNOSIS — M5431 Sciatica, right side: Secondary | ICD-10-CM | POA: Diagnosis not present

## 2023-04-20 DIAGNOSIS — K219 Gastro-esophageal reflux disease without esophagitis: Secondary | ICD-10-CM | POA: Diagnosis not present

## 2023-04-20 LAB — CBC
HCT: 31.2 % — ABNORMAL LOW (ref 36.0–46.0)
Hemoglobin: 9.8 g/dL — ABNORMAL LOW (ref 12.0–15.0)
MCH: 29.6 pg (ref 26.0–34.0)
MCHC: 31.4 g/dL (ref 30.0–36.0)
MCV: 94.3 fL (ref 80.0–100.0)
Platelets: 247 10*3/uL (ref 150–400)
RBC: 3.31 MIL/uL — ABNORMAL LOW (ref 3.87–5.11)
RDW: 14.6 % (ref 11.5–15.5)
WBC: 16.2 10*3/uL — ABNORMAL HIGH (ref 4.0–10.5)
nRBC: 0 % (ref 0.0–0.2)

## 2023-04-20 NOTE — Plan of Care (Signed)

## 2023-04-20 NOTE — Progress Notes (Signed)
   Subjective: 2 Days Post-Op Procedure(s) (LRB): TOTAL KNEE ARTHROPLASTY (Left) Patient reports pain as mild.   Patient seen in rounds for Dr. Lequita Halt. Patient is doing well this AM, resting in recliner and eating breakfast. No issues overnight.  Plan is to go Home after hospital stay.  Objective: Vital signs in last 24 hours: Temp:  [97.6 F (36.4 C)-98.5 F (36.9 C)] 98.5 F (36.9 C) (07/17 0605) Pulse Rate:  [70-96] 82 (07/17 0605) Resp:  [17-18] 17 (07/17 0605) BP: (140-158)/(59-74) 148/65 (07/17 0605) SpO2:  [95 %-99 %] 95 % (07/17 0605)  Intake/Output from previous day:  Intake/Output Summary (Last 24 hours) at 04/20/2023 0814 Last data filed at 04/20/2023 0200 Gross per 24 hour  Intake 1060.89 ml  Output 950 ml  Net 110.89 ml    Intake/Output this shift: No intake/output data recorded.  Labs: Recent Labs    04/19/23 0321 04/20/23 0329  HGB 10.5* 9.8*   Recent Labs    04/19/23 0321 04/20/23 0329  WBC 14.1* 16.2*  RBC 3.65* 3.31*  HCT 33.7* 31.2*  PLT 276 247   Recent Labs    04/19/23 0321  NA 136  K 3.9  CL 104  CO2 24  BUN 11  CREATININE 0.62  GLUCOSE 129*  CALCIUM 9.0   No results for input(s): "LABPT", "INR" in the last 72 hours.  Exam: General - Patient is Alert and Oriented Extremity - Neurologically intact Neurovascular intact Sensation intact distally Dorsiflexion/Plantar flexion intact Dressing/Incision - clean, dry, no drainage Motor Function - intact, moving foot and toes well on exam.   Past Medical History:  Diagnosis Date   Anemia    Diverticulosis    Frequent UTI    sees Dr. Earlene Plater   GERD (gastroesophageal reflux disease)    Gynecological examination    sees Dr. Artist Pais   IBS (irritable bowel syndrome)    Leg edema    Osteoarthritis    Status post dilation of esophageal narrowing    Varicose veins of legs    sees [facility]     Assessment/Plan: 2 Days Post-Op Procedure(s) (LRB): TOTAL KNEE  ARTHROPLASTY (Left) Principal Problem:   OA (osteoarthritis) of knee Active Problems:   Primary osteoarthritis of left knee  Estimated body mass index is 33.33 kg/m as calculated from the following:   Height as of this encounter: 5' 5.5" (1.664 m).   Weight as of this encounter: 92.3 kg. Up with therapy  DVT Prophylaxis - Aspirin Weight-bearing as tolerated  Plan for discharge later today once cleared with PT  Arther Abbott, PA-C Orthopedic Surgery [phone] 04/20/2023, 8:14 AM

## 2023-04-20 NOTE — Progress Notes (Signed)
PT NOTE  04/20/23 1400  PT Visit Information  Last PT Received On 04/20/23  Assistance Needed  Pt remains motivated however was unablt to ascend stairs d/t incr pain this afternoon; RN notified and pt was medicated, will attempt stairs again later today; pt has good home support and is motivated to d/c if possible  History of Present Illness 82 yo female s/p L TKA on 04/18/23. PMH: OA, plantar fasciitis, sciatica, back surgery  Precautions  Precautions Knee;Fall  Precaution Comments pt with improved quad activation, removed KI, no knee buckling  Required Braces or Orthoses Knee Immobilizer - Left  Knee Immobilizer - Left Discontinue once straight leg raise with < 10 degree lag  Restrictions  Weight Bearing Restrictions No  Other Position/Activity Restrictions WBAT  Pain Assessment  Pain Assessment 0-10  Pain Score 9  Pain Location L knee  Pain Descriptors / Indicators Aching;Grimacing;Guarding  Pain Intervention(s) Limited activity within patient's tolerance;Monitored during session;Repositioned;Patient requesting pain meds-RN notified  Cognition  Arousal/Alertness Awake/alert  Behavior During Therapy WFL for tasks assessed/performed  Overall Cognitive Status Within Functional Limits for tasks assessed  Bed Mobility  General bed mobility comments assist with LLE (in recliner)  Transfers  Equipment used Rolling walker (2 wheels)  Transfers Sit to/from Stand  Sit to Stand Min guard  General transfer comment subtle cues for hand placement and foot position, to control descent  Ambulation/Gait  Ambulation/Gait assistance Min guard;Supervision  Gait Distance (Feet) 24 Feet  Assistive device Rolling walker (2 wheels)  Gait Pattern/deviations Step-to pattern;Decreased step length - right;Decreased step length - left;Trunk flexed;Antalgic  General Gait Details cues for sequence, posture and RW position, limtied by pain  Stairs Yes  General stair comments attemptd - unable d/t pain  PT  - End of Session  Equipment Utilized During Treatment Gait belt  Activity Tolerance Patient tolerated treatment well  Patient left in chair;with call bell/phone within reach;with family/visitor present   PT - Assessment/Plan  PT Plan Current plan remains appropriate  PT Visit Diagnosis Other abnormalities of gait and mobility (R26.89);Difficulty in walking, not elsewhere classified (R26.2)  PT Frequency (ACUTE ONLY) 7X/week  Follow Up Recommendations Follow physician's recommendations for discharge plan and follow up therapies  Assistance recommended at discharge Intermittent Supervision/Assistance  Patient can return home with the following A little help with walking and/or transfers;A little help with bathing/dressing/bathroom;Assistance with cooking/housework;Assist for transportation;Help with stairs or ramp for entrance  PT equipment None recommended by PT  AM-PAC PT "6 Clicks" Mobility Outcome Measure (Version 2)  Help needed turning from your back to your side while in a flat bed without using bedrails? 3  Help needed moving from lying on your back to sitting on the side of a flat bed without using bedrails? 3  Help needed moving to and from a bed to a chair (including a wheelchair)? 3  Help needed standing up from a chair using your arms (e.g., wheelchair or bedside chair)? 3  Help needed to walk in hospital room? 3  Help needed climbing 3-5 steps with a railing?  1  6 Click Score 16  Consider Recommendation of Discharge To: Home with [facility]  PT Goal Progression  Progress towards PT goals Progressing toward goals  Acute Rehab PT Goals  PT Goal Formulation With patient  Time For Goal Achievement 04/25/23  Potential to Achieve Goals Good  PT Time Calculation  PT Start Time (ACUTE ONLY) 1403  PT Stop Time (ACUTE ONLY) 1420  PT Time Calculation (min) (ACUTE ONLY)  17 min  PT General Charges  $$ ACUTE PT VISIT 1 Visit  PT Treatments  $Gait Training 8-22 mins

## 2023-04-20 NOTE — Progress Notes (Signed)
PT TX NOTE  04/20/23 1600  PT Visit Information  Last PT Received On 04/20/23  Assistance Needed Pt seen for stair training after pain meds; pt has 2 steps at home, was able to ascend 1 step with min assist; pt will have 2-3 people available to assist at home; discussed bringing chair without arms to top step, pt to ascend single step then sit on chair, pt son(s) will be able to slide chair backwards to allow room for quarter turn to R or L and then stand from chair. Pt family Camelia Eng) present for session and feels confident in ability to assist as needed. Family able to cue pt correctly throughout session. Pt in agreement with plan and d/c today with family assist.   History of Present Illness 82 yo female s/p L TKA on 04/18/23. PMH: OA, plantar fasciitis, sciatica, back surgery  Precautions  Precautions Knee;Fall  Precaution Comments pt with improved quad activation, removed KI, no knee buckling  Required Braces or Orthoses Knee Immobilizer - Left  Knee Immobilizer - Left Discontinue once straight leg raise with < 10 degree lag  Restrictions  Weight Bearing Restrictions No  Other Position/Activity Restrictions WBAT  Pain Assessment  Pain Assessment 0-10  Pain Score 5  Pain Location L knee  Pain Descriptors / Indicators Aching;Grimacing;Guarding  Pain Intervention(s) Monitored during session;Limited activity within patient's tolerance;Premedicated before session;Repositioned;Ice applied  Cognition  Arousal/Alertness Awake/alert  Behavior During Therapy WFL for tasks assessed/performed  Overall Cognitive Status Within Functional Limits for tasks assessed  Bed Mobility  General bed mobility comments assist with LLE (in recliner)  Transfers  Equipment used Rolling walker (2 wheels)  Transfers Sit to/from Stand  Sit to Stand Min guard  General transfer comment cues for hand placement and foot position, scooting to edge of seat,  to control descent; assist to lower LLE to floor   Ambulation/Gait  Ambulation/Gait assistance Min guard  Gait Distance (Feet) 20 Feet  Assistive device Rolling walker (2 wheels)  Gait Pattern/deviations Step-to pattern;Decreased step length - right;Decreased step length - left;Trunk flexed;Antalgic  General Gait Details cues for sequence, posture and RW position, limtied by pain  Stairs Yes  Stairs assistance Min assist;+2 safety/equipment  Stair Management No rails;Step to pattern;Backwards;With walker  Number of Stairs 1  General stair comments amb up/down one step with min assist, KI in place to incr knee stability and allow incr wt shift to LLE; multi-modal cues for sequence, technique, use of UEs  PT - End of Session  Equipment Utilized During Treatment Gait belt  Activity Tolerance Patient tolerated treatment well;Patient limited by fatigue  Patient left in chair;with call bell/phone within reach;with family/visitor present   PT - Assessment/Plan  PT Plan Current plan remains appropriate  PT Visit Diagnosis Other abnormalities of gait and mobility (R26.89);Difficulty in walking, not elsewhere classified (R26.2)  PT Frequency (ACUTE ONLY) 7X/week  Follow Up Recommendations Follow physician's recommendations for discharge plan and follow up therapies  Assistance recommended at discharge Intermittent Supervision/Assistance  Patient can return home with the following A little help with walking and/or transfers;A little help with bathing/dressing/bathroom;Assistance with cooking/housework;Assist for transportation;Help with stairs or ramp for entrance  PT equipment None recommended by PT  AM-PAC PT "6 Clicks" Mobility Outcome Measure (Version 2)  Help needed turning from your back to your side while in a flat bed without using bedrails? 3  Help needed moving from lying on your back to sitting on the side of a flat bed without using bedrails? 3  Help needed moving to and from a bed to a chair (including a wheelchair)? 3  Help needed  standing up from a chair using your arms (e.g., wheelchair or bedside chair)? 3  Help needed to walk in hospital room? 3  Help needed climbing 3-5 steps with a railing?  2  6 Click Score 17  Consider Recommendation of Discharge To: Home with [facility]  PT Goal Progression  Progress towards PT goals Progressing toward goals  Acute Rehab PT Goals  PT Goal Formulation With patient  Time For Goal Achievement 04/25/23  Potential to Achieve Goals Good  PT Time Calculation  PT Start Time (ACUTE ONLY) 1545  PT Stop Time (ACUTE ONLY) 1630  PT Time Calculation (min) (ACUTE ONLY) 45 min  PT General Charges  $$ ACUTE PT VISIT 1 Visit  PT Treatments  $Gait Training 23-37 mins  $Therapeutic Activity 8-22 mins

## 2023-04-20 NOTE — Plan of Care (Signed)
Pt ready to DC home with family. 

## 2023-04-20 NOTE — Progress Notes (Signed)
Physical Therapy Treatment Patient Details Name: Crystal Little MRN: [id number] DOB: [birth year] Today's Date: 04/20/2023   History of Present Illness 82 yo female s/p L TKA on 04/18/23. PMH: OA, plantar fasciitis, sciatica, back surgery    PT Comments  Pt progressing well this session; incr gait distance/activity tol. Will see again to review stairs and pt will likely be ready to d/c /ater this pm.     Assistance Recommended at Discharge Intermittent Supervision/Assistance  If plan is discharge home, recommend the following:  Can travel by private vehicle    A little help with walking and/or transfers;A little help with bathing/dressing/bathroom;Assistance with cooking/housework;Assist for transportation;Help with stairs or ramp for entrance      Equipment Recommendations  None recommended by PT    Recommendations for Other Services       Precautions / Restrictions Precautions Precautions: Knee;Fall Precaution Comments: pt with improved quad activation, removed KI, no knee buckling Required Braces or Orthoses: Knee Immobilizer - Left Knee Immobilizer - Left: Discontinue once straight leg raise with < 10 degree lag Restrictions Weight Bearing Restrictions: No Other Position/Activity Restrictions: WBAT     Mobility  Bed Mobility               General bed mobility comments:  (in recliner)    Transfers Overall transfer level: Needs assistance Equipment used: Rolling walker (2 wheels) Transfers: Sit to/from Stand Sit to Stand: Min guard           General transfer comment: cues for hand and R and LLE position, min-guard assist to rise and transition to RW    Ambulation/Gait Ambulation/Gait assistance: Min guard, Supervision Gait Distance (Feet): 65 Feet Assistive device: Rolling walker (2 wheels) Gait Pattern/deviations: Step-to pattern, Decreased step length - right, Decreased step length - left, Trunk flexed       General Gait Details: cues for  sequence, posture and RW position. progression to step through with decr stride length   Stairs             Wheelchair Mobility     Tilt Bed    Modified Rankin (Stroke Patients Only)       Balance                                            Cognition Arousal/Alertness: Awake/alert Behavior During Therapy: WFL for tasks assessed/performed Overall Cognitive Status: Within Functional Limits for tasks assessed                                          Exercises Total Joint Exercises Ankle Circles/Pumps: AROM, Both, 10 reps Quad Sets: AROM, Strengthening, Both, 10 reps Heel Slides: AAROM, Left, 10 reps Hip ABduction/ADduction: AROM, Left, 10 reps Straight Leg Raises: AAROM, Left, 10 reps    General Comments        Pertinent Vitals/Pain Pain Assessment Pain Assessment: 0-10 Pain Location: L knee Pain Descriptors / Indicators: Aching, Grimacing, Guarding    Home Living                          Prior Function            PT Goals (current goals can now be found in the care plan section) Acute Rehab  PT Goals PT Goal Formulation: With patient Time For Goal Achievement: 04/25/23 Potential to Achieve Goals: Good Progress towards PT goals: Progressing toward goals    Frequency    7X/week      PT Plan Current plan remains appropriate    Co-evaluation              AM-PAC PT "6 Clicks" Mobility   Outcome Measure  Help needed turning from your back to your side while in a flat bed without using bedrails?: A Little Help needed moving from lying on your back to sitting on the side of a flat bed without using bedrails?: A Little Help needed moving to and from a bed to a chair (including a wheelchair)?: A Little Help needed standing up from a chair using your arms (e.g., wheelchair or bedside chair)?: A Little Help needed to walk in hospital room?: A Little Help needed climbing 3-5 steps with a railing? : A  Lot 6 Click Score: 17    End of Session Equipment Utilized During Treatment: Gait belt Activity Tolerance: Patient tolerated treatment well Patient left: in chair;with call bell/phone within reach;with family/visitor present   PT Visit Diagnosis: Other abnormalities of gait and mobility (R26.89);Difficulty in walking, not elsewhere classified (R26.2)     Time: 3716-9678 PT Time Calculation (min) (ACUTE ONLY): 35 min  Charges:    $Gait Training: 8-22 mins $Therapeutic Exercise: 8-22 mins PT General Charges $$ ACUTE PT VISIT: 1 Visit                     Lynne Righi, PT  Acute Rehab Dept [facility]) [phone]  04/20/2023    [facility] 04/20/2023, 11:23 AM

## 2023-04-21 ENCOUNTER — Other Ambulatory Visit: Payer: Self-pay | Admitting: Family Medicine

## 2023-04-21 DIAGNOSIS — R609 Edema, unspecified: Secondary | ICD-10-CM

## 2023-04-21 DIAGNOSIS — M25662 Stiffness of left knee, not elsewhere classified: Secondary | ICD-10-CM | POA: Diagnosis not present

## 2023-04-21 DIAGNOSIS — M25562 Pain in left knee: Secondary | ICD-10-CM | POA: Diagnosis not present

## 2023-04-22 ENCOUNTER — Telehealth: Payer: Self-pay | Admitting: Family Medicine

## 2023-04-22 NOTE — Telephone Encounter (Signed)
FYI Spoke with Crystal Little advised that the office is closed and that Dr Clent Ridges will be back on Monday. Advised Crystal Little to call the after hours tele-visit or go to the nearest urgent Care for treatment. Advised Crystal Little to call the office on Monday if not feeling better.

## 2023-04-22 NOTE — Telephone Encounter (Signed)
Pt requesting meds for a UTI, she normally gets these post surgery and had surgery on Monday (04/18/23) hoping to get filled so that she does not have to go through the weekend with it [id number] [place], [place] - [address] Phone: [phone]  Fax: [phone]

## 2023-04-25 NOTE — Discharge Summary (Signed)
Patient ID: Crystal Little MRN: [id number] DOB/AGE: 84-May-1942 84 y.o.  Admit date: 04/18/2023 Discharge date: 04/20/2023  Admission Diagnoses:  Principal Problem:   OA (osteoarthritis) of knee Active Problems:   Primary osteoarthritis of left knee   Discharge Diagnoses:  Same  Past Medical History:  Diagnosis Date   Anemia    Diverticulosis    Frequent UTI    sees Dr. Earlene Little   GERD (gastroesophageal reflux disease)    Gynecological examination    sees Dr. Artist Little   IBS (irritable bowel syndrome)    Leg edema    Osteoarthritis    Status post dilation of esophageal narrowing    Varicose veins of legs    sees [facility]     Surgeries: Procedure(s): TOTAL KNEE ARTHROPLASTY on 04/18/2023   Consultants:   Discharged Condition: Improved  Hospital Course: Crystal Little is an 82 y.o. female who was admitted 04/18/2023 for operative treatment ofOA (osteoarthritis) of knee. Patient has severe unremitting pain that affects sleep, daily activities, and work/hobbies. After pre-op clearance the patient was taken to the operating room on 04/18/2023 and underwent  Procedure(s): TOTAL KNEE ARTHROPLASTY.    Patient was given perioperative antibiotics:  Anti-infectives (From admission, onward)    Start     Dose/Rate Route Frequency Ordered Stop   04/18/23 1330  ceFAZolin (ANCEF) IVPB 2g/100 mL premix        2 g 200 mL/hr over 30 Minutes Intravenous Every 6 hours 04/18/23 1119 04/19/23 1354   04/18/23 0600  ceFAZolin (ANCEF) IVPB 2g/100 mL premix        2 g 200 mL/hr over 30 Minutes Intravenous On call to O.R. 04/18/23 0533 04/18/23 0717        Patient was given sequential compression devices, early ambulation, and chemoprophylaxis to prevent DVT.  Patient benefited maximally from hospital stay and there were no complications.    Recent vital signs: No data found.   Recent laboratory studies: No results for input(s): "WBC", "HGB", "HCT", "PLT", "NA", "K",  "CL", "CO2", "BUN", "CREATININE", "GLUCOSE", "INR", "CALCIUM" in the last 72 hours.  Invalid input(s): "PT", "2"   Discharge Medications:   Allergies as of 04/20/2023       Reactions   Amoxicillin    Unknown reaction         Medication List     STOP taking these medications    ibuprofen 200 MG tablet Commonly known as: ADVIL       TAKE these medications    aspirin 81 MG chewable tablet Chew 1 tablet (81 mg total) by mouth 2 (two) times daily for 20 days. Then take one 81 mg aspirin once a day for three weeks. Then discontinue aspirin.   cholecalciferol 25 MCG (1000 UNIT) tablet Commonly known as: VITAMIN D3 Take 1,000 Units by mouth daily.   methocarbamol 500 MG tablet Commonly known as: ROBAXIN Take 1 tablet (500 mg total) by mouth every 6 (six) hours as needed for muscle spasms.   omeprazole 40 MG capsule Commonly known as: PRILOSEC TAKE 1 CAPSULE (40 MG TOTAL) BY MOUTH DAILY.   ondansetron 4 MG tablet Commonly known as: ZOFRAN Take 1 tablet (4 mg total) by mouth every 6 (six) hours as needed for nausea.   oxyCODONE 5 MG immediate release tablet Commonly known as: Oxy IR/ROXICODONE Take 1-2 tablets (5-10 mg total) by mouth every 6 (six) hours as needed for severe pain.   phentermine 37.5 MG capsule Take 1 capsule (37.5 mg total)  by mouth every morning.   THERATEARS OP Place 1 drop into both eyes daily as needed (dry eyes).   traMADol 50 MG tablet Commonly known as: ULTRAM Take 1-2 tablets (50-100 mg total) by mouth every 6 (six) hours as needed for moderate pain.   VIACTIV CALCIUM PLUS D PO Take 1 tablet by mouth daily.   vitamin E 180 MG (400 UNITS) capsule Take 400 Units by mouth daily.               Discharge Care Instructions  (From admission, onward)           Start     Ordered   04/19/23 0000  Weight bearing as tolerated        04/19/23 0839   04/19/23 0000  Change dressing       Comments: You may remove the bulky bandage  (ACE wrap and gauze) two days after surgery. You will have an adhesive waterproof bandage underneath. Leave this in place until your first follow-up appointment.   04/19/23 0839            Diagnostic Studies: No results found.  Disposition: Discharge disposition: 01-Home or Self Care       Discharge Instructions     Call MD / Call 911   Complete by: As directed    If you experience chest pain or shortness of breath, CALL 911 and be transported to the hospital emergency room.  If you develope a fever above 101 F, pus (white drainage) or increased drainage or redness at the wound, or calf pain, call your surgeon's office.   Change dressing   Complete by: As directed    You may remove the bulky bandage (ACE wrap and gauze) two days after surgery. You will have an adhesive waterproof bandage underneath. Leave this in place until your first follow-up appointment.   Constipation Prevention   Complete by: As directed    Drink plenty of fluids.  Prune juice may be helpful.  You may use a stool softener, such as Colace (over the counter) 100 mg twice a day.  Use MiraLax (over the counter) for constipation as needed.   Diet - low sodium heart healthy   Complete by: As directed    Do not put a pillow under the knee. Place it under the heel.   Complete by: As directed    Driving restrictions   Complete by: As directed    No driving for two weeks   Post-operative opioid taper instructions:   Complete by: As directed    POST-OPERATIVE OPIOID TAPER INSTRUCTIONS: It is important to wean off of your opioid medication as soon as possible. If you do not need pain medication after your surgery it is ok to stop day one. Opioids include: Codeine, Hydrocodone(Norco, Vicodin), Oxycodone(Percocet, oxycontin) and hydromorphone amongst others.  Long term and even short term use of opiods can cause: Increased pain response Dependence Constipation Depression Respiratory depression And more.   Withdrawal symptoms can include Flu like symptoms Nausea, vomiting And more Techniques to manage these symptoms Hydrate well Eat regular healthy meals Stay active Use relaxation techniques(deep breathing, meditating, yoga) Do Not substitute Alcohol to help with tapering If you have been on opioids for less than two weeks and do not have pain than it is ok to stop all together.  Plan to wean off of opioids This plan should start within one week post op of your joint replacement. Maintain the same interval or time between taking  each dose and first decrease the dose.  Cut the total daily intake of opioids by one tablet each day Next start to increase the time between doses. The last dose that should be eliminated is the evening dose.      TED hose   Complete by: As directed    Use stockings (TED hose) for three weeks on both leg(s).  You may remove them at night for sleeping.   Weight bearing as tolerated   Complete by: As directed         Follow-up Information     Ollen Gross, MD. Go on 05/03/2023.   Specialty: Orthopedic Surgery Why: You are scheduled for first post op appt on Tuesday July 30 at 3:45pm. Contact information: [address] [place] [address] [phone]                  Signed: Arther Abbott 04/25/2023, 7:51 AM

## 2023-04-27 DIAGNOSIS — M25662 Stiffness of left knee, not elsewhere classified: Secondary | ICD-10-CM | POA: Diagnosis not present

## 2023-04-29 DIAGNOSIS — M25662 Stiffness of left knee, not elsewhere classified: Secondary | ICD-10-CM | POA: Diagnosis not present

## 2023-04-29 DIAGNOSIS — R6889 Other general symptoms and signs: Secondary | ICD-10-CM | POA: Diagnosis not present

## 2023-04-30 DIAGNOSIS — R6889 Other general symptoms and signs: Secondary | ICD-10-CM | POA: Diagnosis not present

## 2023-05-01 DIAGNOSIS — R6889 Other general symptoms and signs: Secondary | ICD-10-CM | POA: Diagnosis not present

## 2023-05-02 DIAGNOSIS — R6889 Other general symptoms and signs: Secondary | ICD-10-CM | POA: Diagnosis not present

## 2023-05-03 DIAGNOSIS — R6889 Other general symptoms and signs: Secondary | ICD-10-CM | POA: Diagnosis not present

## 2023-05-04 DIAGNOSIS — R6889 Other general symptoms and signs: Secondary | ICD-10-CM | POA: Diagnosis not present

## 2023-05-05 DIAGNOSIS — M25562 Pain in left knee: Secondary | ICD-10-CM | POA: Diagnosis not present

## 2023-05-05 DIAGNOSIS — R6889 Other general symptoms and signs: Secondary | ICD-10-CM | POA: Diagnosis not present

## 2023-05-05 DIAGNOSIS — M25662 Stiffness of left knee, not elsewhere classified: Secondary | ICD-10-CM | POA: Diagnosis not present

## 2023-05-06 DIAGNOSIS — R6889 Other general symptoms and signs: Secondary | ICD-10-CM | POA: Diagnosis not present

## 2023-05-09 DIAGNOSIS — M25662 Stiffness of left knee, not elsewhere classified: Secondary | ICD-10-CM | POA: Diagnosis not present

## 2023-05-09 DIAGNOSIS — M25562 Pain in left knee: Secondary | ICD-10-CM | POA: Diagnosis not present

## 2023-05-16 DIAGNOSIS — M25662 Stiffness of left knee, not elsewhere classified: Secondary | ICD-10-CM | POA: Diagnosis not present

## 2023-05-16 DIAGNOSIS — M25562 Pain in left knee: Secondary | ICD-10-CM | POA: Diagnosis not present

## 2023-05-18 DIAGNOSIS — M25562 Pain in left knee: Secondary | ICD-10-CM | POA: Diagnosis not present

## 2023-05-18 DIAGNOSIS — M25662 Stiffness of left knee, not elsewhere classified: Secondary | ICD-10-CM | POA: Diagnosis not present

## 2023-05-19 ENCOUNTER — Encounter (INDEPENDENT_AMBULATORY_CARE_PROVIDER_SITE_OTHER): Payer: Self-pay

## 2023-05-23 DIAGNOSIS — M25562 Pain in left knee: Secondary | ICD-10-CM | POA: Diagnosis not present

## 2023-05-23 DIAGNOSIS — M25662 Stiffness of left knee, not elsewhere classified: Secondary | ICD-10-CM | POA: Diagnosis not present

## 2023-05-24 DIAGNOSIS — Z5189 Encounter for other specified aftercare: Secondary | ICD-10-CM | POA: Diagnosis not present

## 2023-05-25 DIAGNOSIS — M25662 Stiffness of left knee, not elsewhere classified: Secondary | ICD-10-CM | POA: Diagnosis not present

## 2023-05-25 DIAGNOSIS — M25562 Pain in left knee: Secondary | ICD-10-CM | POA: Diagnosis not present

## 2023-05-31 DIAGNOSIS — M25562 Pain in left knee: Secondary | ICD-10-CM | POA: Diagnosis not present

## 2023-05-31 DIAGNOSIS — M25662 Stiffness of left knee, not elsewhere classified: Secondary | ICD-10-CM | POA: Diagnosis not present

## 2023-06-01 ENCOUNTER — Encounter: Payer: Self-pay | Admitting: Family Medicine

## 2023-06-02 DIAGNOSIS — M25562 Pain in left knee: Secondary | ICD-10-CM | POA: Diagnosis not present

## 2023-06-02 DIAGNOSIS — M25662 Stiffness of left knee, not elsewhere classified: Secondary | ICD-10-CM | POA: Diagnosis not present

## 2023-06-07 DIAGNOSIS — H40013 Open angle with borderline findings, low risk, bilateral: Secondary | ICD-10-CM | POA: Diagnosis not present

## 2023-06-07 DIAGNOSIS — H5213 Myopia, bilateral: Secondary | ICD-10-CM | POA: Diagnosis not present

## 2023-06-07 DIAGNOSIS — H26491 Other secondary cataract, right eye: Secondary | ICD-10-CM | POA: Diagnosis not present

## 2023-06-07 DIAGNOSIS — H43813 Vitreous degeneration, bilateral: Secondary | ICD-10-CM | POA: Diagnosis not present

## 2023-06-07 DIAGNOSIS — H524 Presbyopia: Secondary | ICD-10-CM | POA: Diagnosis not present

## 2023-06-07 DIAGNOSIS — H04123 Dry eye syndrome of bilateral lacrimal glands: Secondary | ICD-10-CM | POA: Diagnosis not present

## 2023-06-08 DIAGNOSIS — M25562 Pain in left knee: Secondary | ICD-10-CM | POA: Diagnosis not present

## 2023-06-08 DIAGNOSIS — M25662 Stiffness of left knee, not elsewhere classified: Secondary | ICD-10-CM | POA: Diagnosis not present

## 2023-06-10 DIAGNOSIS — M25562 Pain in left knee: Secondary | ICD-10-CM | POA: Diagnosis not present

## 2023-06-10 DIAGNOSIS — M25662 Stiffness of left knee, not elsewhere classified: Secondary | ICD-10-CM | POA: Diagnosis not present

## 2023-06-13 ENCOUNTER — Ambulatory Visit (INDEPENDENT_AMBULATORY_CARE_PROVIDER_SITE_OTHER): Payer: Medicare Other

## 2023-06-13 VITALS — Ht 66.0 in | Wt 211.0 lb

## 2023-06-13 DIAGNOSIS — Z0000 Encounter for general adult medical examination without abnormal findings: Secondary | ICD-10-CM | POA: Diagnosis not present

## 2023-06-13 NOTE — Progress Notes (Signed)
Subjective:   Crystal Little is a 82 y.o. female who presents for Medicare Annual (Subsequent) preventive examination.  Visit Complete: Virtual  I connected with  Crystal Little on 06/13/23 by a audio enabled telemedicine application and verified that I am speaking with the correct person using two identifiers.  Patient Location: Home  Provider Location: Home Office  I discussed the limitations of evaluation and management by telemedicine. The patient expressed understanding and agreed to proceed.   Review of Systems    Vital Signs: Unable to obtain new vitals due to this being a telehealth visit.  Cardiac Risk Factors include: advanced age (>74men, >54 women)     Objective:    Today's Vitals   06/13/23 0925  Weight: 211 lb (95.7 kg)  Height: 5\' 6"  (1.676 m)   Body mass index is 34.06 kg/m.     06/13/2023    9:33 AM 04/18/2023   12:00 PM 04/08/2023    1:20 PM 06/08/2022   11:00 AM 06/03/2021    3:14 PM 04/21/2020    3:35 PM 10/12/2017    9:33 AM  Advanced Directives  Does Patient Have a Medical Advance Directive? Yes Yes Yes Yes Yes Yes Yes  Type of Estate agent of [place];Living will Healthcare Power of [employer] of [place];Living will Healthcare Power of [place];Living will Healthcare Power of [place];Living will Healthcare Power of [place];Living will   Does patient want to make changes to medical advance directive?  No - Patient declined No - Patient declined No - Patient declined     Copy of Healthcare Power of Attorney in Chart? No - copy requested No - copy requested No - copy requested No - copy requested No - copy requested Yes - validated most recent copy scanned in chart (See row information)     Current Medications (verified) Outpatient Encounter Medications as of 06/13/2023  Medication Sig   Calcium-Vitamin D-Vitamin K (VIACTIV CALCIUM PLUS D PO) Take 1 tablet by mouth daily.   Carboxymethylcellulose Sodium  (THERATEARS OP) Place 1 drop into both eyes daily as needed (dry eyes).   cholecalciferol (VITAMIN D3) 25 MCG (1000 UNIT) tablet Take 1,000 Units by mouth daily.   methocarbamol (ROBAXIN) 500 MG tablet Take 1 tablet (500 mg total) by mouth every 6 (six) hours as needed for muscle spasms. (Patient not taking: Reported on 06/13/2023)   omeprazole (PRILOSEC) 40 MG capsule TAKE 1 CAPSULE (40 MG TOTAL) BY MOUTH DAILY.   ondansetron (ZOFRAN) 4 MG tablet Take 1 tablet (4 mg total) by mouth every 6 (six) hours as needed for nausea. (Patient not taking: Reported on 06/13/2023)   oxyCODONE (OXY IR/ROXICODONE) 5 MG immediate release tablet Take 1-2 tablets (5-10 mg total) by mouth every 6 (six) hours as needed for severe pain. (Patient not taking: Reported on 06/13/2023)   phentermine 37.5 MG capsule Take 1 capsule (37.5 mg total) by mouth every morning.   torsemide (DEMADEX) 20 MG tablet TAKE 2 TABLETS BY MOUTH TWICE A DAY   traMADol (ULTRAM) 50 MG tablet Take 1-2 tablets (50-100 mg total) by mouth every 6 (six) hours as needed for moderate pain. (Patient not taking: Reported on 06/13/2023)   vitamin E 180 MG (400 UNITS) capsule Take 400 Units by mouth daily.   No facility-administered encounter medications on file as of 06/13/2023.    Allergies (verified) Amoxicillin   History: Past Medical History:  Diagnosis Date   Anemia    Diverticulosis    Frequent UTI  sees Dr. Earlene Plater   GERD (gastroesophageal reflux disease)    Gynecological examination    sees Dr. Artist Pais   IBS (irritable bowel syndrome)    Leg edema    Osteoarthritis    Status post dilation of esophageal narrowing    Varicose veins of legs    sees [facility]    Past Surgical History:  Procedure Laterality Date   BACK SURGERY  10/2021   lumbar 4-5   COLONOSCOPY  04/26/2011   per Dr. Jarold Motto, diverticulosis only, no repeats needed   endovascular repair of bilateral leg varicosities     per Dr. Jimmie Molly     ESOPHAGOGASTRODUODENOSCOPY  01/19/2010   per Dr. Jarold Motto, with dilatation    ESOPHAGOGASTRODUODENOSCOPY (EGD) WITH ESOPHAGEAL DILATION     right knee arthroscopy  12/17/05 and 01-06-12   per Dr. 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   Procedure: TOTAL KNEE ARTHROPLASTY;  Surgeon: Ollen Gross, MD;  Location: [facility];  Service: Orthopedics;  Laterality: Left;   Family History  Problem Relation Age of Onset   Irritable bowel syndrome Mother    Pancreatic cancer Paternal Grandmother    Diabetes Maternal Aunt    Diabetes Maternal Uncle    Heart disease Paternal Grandfather    Colon cancer Neg Hx    Colon polyps Neg Hx    Kidney disease Neg Hx    Social History   Socioeconomic History   Marital status: Single    Spouse name: Not on file   Number of children: 3   Years of education: Not on file   Highest education level: Not on file  Occupational History   Occupation: [employer] pilot  Tobacco Use   Smoking status: Never   Smokeless tobacco: Never  Vaping Use   Vaping status: Never Used  Substance and Sexual Activity   Alcohol use: Not Currently    Alcohol/week: 2.0 standard drinks of alcohol    Types: 2 Glasses of wine per week    Comment: occ   Drug use: No   Sexual activity: Not Currently  Other Topics Concern   Not on file  Social History Narrative   Not on file   Social Determinants of Health   Financial Resource Strain: Low Risk  (06/13/2023)   Overall Financial Resource Strain (CARDIA)    Difficulty of Paying Living Expenses: Not hard at all  Food Insecurity: No Food Insecurity (06/13/2023)   Hunger Vital Sign    Worried About Running Out of Food in the Last Year: Never true    Ran Out of Food in the Last Year: Never true  Transportation Needs: No Transportation Needs (06/13/2023)   PRAPARE - Administrator, Civil Service (Medical): No    Lack of Transportation (Non-Medical): No  Physical Activity: Insufficiently Active  (06/13/2023)   Exercise Vital Sign    Days of Exercise per Week: 2 days    Minutes of Exercise per Session: 40 min  Stress: No Stress Concern Present (06/13/2023)   [employer] of Occupational Health - Occupational Stress Questionnaire    Feeling of Stress : Not at all  Social Connections: Moderately Integrated (06/13/2023)   Social Connection and Isolation Panel [NHANES]    Frequency of Communication with Friends and Family: More than three times a week    Frequency of Social Gatherings with Friends and Family: More than three times a week    Attends Religious Services: More than 4 times  per year    Active Member of Clubs or Organizations: Yes    Attends Banker Meetings: More than 4 times per year    Marital Status: Widowed    Tobacco Counseling Counseling given: Not Answered   Clinical Intake:  Pre-visit preparation completed: Yes  Pain : No/denies pain     BMI - recorded: 34.06 Nutritional Status: BMI > 30  Obese Nutritional Risks: None Diabetes: No  How often do you need to have someone help you when you read instructions, pamphlets, or other written materials from your doctor or pharmacy?: 1 - Never  Interpreter Needed?: No  Information entered by :: Theresa Mulligan LPN   Activities of Daily Living    06/13/2023    9:32 AM 04/18/2023   12:00 PM  In your present state of health, do you have any difficulty performing the following activities:  Hearing? 0 1  Vision? 0 0  Difficulty concentrating or making decisions? 0 0  Walking or climbing stairs? 0 1  Dressing or bathing? 0 0  Doing errands, shopping? 0 0  Preparing Food and eating ? N   Using the Toilet? N   In the past six months, have you accidently leaked urine? N   Do you have problems with loss of bowel control? N   Managing your Medications? N   Managing your Finances? N   Housekeeping or managing your Housekeeping? N     Patient Care Team: Nelwyn Salisbury, MD as PCP -  General  Indicate any recent Medical Services you may have received from other than [facility] providers in the past year (date may be approximate).     Assessment:   This is a routine wellness examination for [place].  Hearing/Vision screen Hearing Screening - Comments:: Denies hearing difficulties   Vision Screening - Comments:: Wears rx glasses - up to date with routine eye exams with  Dr Burgess Estelle   Goals Addressed               This Visit's Progress     Patient stated (pt-stated)        I want to be able to walk without pain in my legs.       Depression Screen    06/13/2023    9:31 AM 06/08/2022   10:54 AM 06/08/2022   10:53 AM 04/27/2022    9:37 AM 01/18/2022    8:42 AM 12/15/2021    2:31 PM 11/09/2021   11:10 AM  PHQ 2/9 Scores  PHQ - 2 Score 0 0 0 1 1 0 0  PHQ- 9 Score  0 0 6 5 5 4     Fall Risk    06/13/2023    9:32 AM 06/08/2022   10:58 AM 04/27/2022    9:36 AM 01/18/2022    8:42 AM 12/15/2021    2:32 PM  Fall Risk   Falls in the past year? 0 0 0 0 0  Number falls in past yr: 0 0 0 0 0  Injury with Fall? 0 0 0 0 0  Risk for fall due to : No Fall Risks No Fall Risks No Fall Risks No Fall Risks No Fall Risks  Follow up Falls prevention discussed Falls prevention discussed Falls evaluation completed Falls evaluation completed     MEDICARE RISK AT HOME: Medicare Risk at Home Any stairs in or around the home?: No If so, are there any without handrails?: No Home free of loose throw rugs in walkways, pet beds,  electrical cords, etc?: Yes Adequate lighting in your home to reduce risk of falls?: Yes Life alert?: No Use of a cane, walker or w/c?: Yes Grab bars in the bathroom?: Yes Shower chair or bench in shower?: Yes Elevated toilet seat or a handicapped toilet?: Yes  TIMED UP AND GO:  Was the test performed?  No    Cognitive Function:    10/12/2017    9:40 AM  MMSE - Mini Mental State Exam  Not completed: --        06/13/2023    9:33 AM 06/08/2022   11:00 AM  04/21/2020    3:40 PM  6CIT Screen  What Year? 0 points 0 points 0 points  What month? 0 points 0 points 0 points  What time? 0 points 0 points 0 points  Count back from 20 0 points 0 points 0 points  Months in reverse 0 points 0 points 0 points  Repeat phrase 0 points 0 points 0 points  Total Score 0 points 0 points 0 points    Immunizations Immunization History  Administered Date(s) Administered   Influenza, High Dose Seasonal PF 07/31/2015, 07/06/2016, 06/07/2018, 05/24/2019   Influenza,inj,Quad PF,6+ Mos 06/20/2013, 06/24/2014   Influenza,inj,quad, With Preservative 07/04/2017   Influenza-Unspecified 07/11/2017, 06/04/2020   PFIZER(Purple Top)SARS-COV-2 Vaccination 10/19/2019, 11/09/2019   Pneumococcal Conjugate-13 06/24/2014   Pneumococcal Polysaccharide-23 10/09/2009   Td 10/09/2009, 12/31/2019   Zoster Recombinant(Shingrix) 04/24/2018, 07/25/2018   Zoster, Live 08/26/2012    TDAP status: Up to date  Flu Vaccine status: Due, Education has been provided regarding the importance of this vaccine. Advised may receive this vaccine at local pharmacy or Health Dept. Aware to provide a copy of the vaccination record if obtained from local pharmacy or Health Dept. Verbalized acceptance and understanding.  Pneumococcal vaccine status: Up to date  Covid-19 vaccine status: Declined, Education has been provided regarding the importance of this vaccine but patient still declined. Advised may receive this vaccine at local pharmacy or Health Dept.or vaccine clinic. Aware to provide a copy of the vaccination record if obtained from local pharmacy or Health Dept. Verbalized acceptance and understanding.  Qualifies for Shingles Vaccine? Yes   Zostavax completed Yes   Shingrix Completed?: Yes  Screening Tests Health Maintenance  Topic Date Due   INFLUENZA VACCINE  05/05/2023   COVID-19 Vaccine (7 - 2023-24 season) 06/05/2023   Medicare Annual Wellness (AWV)  06/12/2024   DTaP/Tdap/Td (3  - Tdap) 12/30/2029   Pneumonia Vaccine 30+ Years old  Completed   DEXA SCAN  Completed   Zoster Vaccines- Shingrix  Completed   HPV VACCINES  Aged Out    Health Maintenance  Health Maintenance Due  Topic Date Due   INFLUENZA VACCINE  05/05/2023   COVID-19 Vaccine (7 - 2023-24 season) 06/05/2023        Bone Density status: Completed 08/18/17. Results reflect: Bone density results: OSTEOPENIA. Repeat every   years.  Lung Cancer Screening: (Low Dose CT Chest recommended if Age 57-80 years, 20 pack-year currently smoking OR have quit w/in 15years.) does not qualify.     Additional Screening:  Hepatitis C Screening: does not qualify;   Vision Screening: Recommended annual ophthalmology exams for early detection of glaucoma and other disorders of the eye. Is the patient up to date with their annual eye exam?  Yes  Who is the provider or what is the name of the office in which the patient attends annual eye exams? Dr Burgess Estelle If pt is not established with  a provider, would they like to be referred to a provider to establish care? No .   Dental Screening: Recommended annual dental exams for proper oral hygiene  Community Resource Referral / Chronic Care Management:  CRR required this visit?  No   CCM required this visit?  No     Plan:     I have personally reviewed and noted the following in the patient's chart:   Medical and social history Use of alcohol, tobacco or illicit drugs  Current medications and supplements including opioid prescriptions. Patient is not currently taking opioid prescriptions. Functional ability and status Nutritional status Physical activity Advanced directives List of other physicians Hospitalizations, surgeries, and ER visits in previous 12 months Vitals Screenings to include cognitive, depression, and falls Referrals and appointments  In addition, I have reviewed and discussed with patient certain preventive protocols, quality metrics,  and best practice recommendations. A written personalized care plan for preventive services as well as general preventive health recommendations were provided to patient.     Tillie Rung, LPN   0/06/8118   After Visit Summary: (MyChart) Due to this being a telephonic visit, the after visit summary with patients personalized plan was offered to patient via MyChart   Nurse Notes: None

## 2023-06-13 NOTE — Patient Instructions (Addendum)
Crystal Little , Thank you for taking time to come for your Medicare Wellness Visit. I appreciate your ongoing commitment to your health goals. Please review the following plan we discussed and let me know if I can assist you in the future.   Referrals/Orders/Follow-Ups/Clinician Recommendations:   This is a list of the screening recommended for you and due dates:  Health Maintenance  Topic Date Due   Flu Shot  05/05/2023   COVID-19 Vaccine (7 - 2023-24 season) 06/05/2023   Medicare Annual Wellness Visit  06/12/2024   DTaP/Tdap/Td vaccine (3 - Tdap) 12/30/2029   Pneumonia Vaccine  Completed   DEXA scan (bone density measurement)  Completed   Zoster (Shingles) Vaccine  Completed   HPV Vaccine  Aged Out    Advanced directives: (Copy Requested) Please bring a copy of your health care power of attorney and living will to the office to be added to your chart at your convenience.  Next Medicare Annual Wellness Visit scheduled for next year: Yes

## 2023-06-16 DIAGNOSIS — M25662 Stiffness of left knee, not elsewhere classified: Secondary | ICD-10-CM | POA: Diagnosis not present

## 2023-06-16 DIAGNOSIS — M25562 Pain in left knee: Secondary | ICD-10-CM | POA: Diagnosis not present

## 2023-06-17 DIAGNOSIS — L821 Other seborrheic keratosis: Secondary | ICD-10-CM | POA: Diagnosis not present

## 2023-06-17 DIAGNOSIS — D225 Melanocytic nevi of trunk: Secondary | ICD-10-CM | POA: Diagnosis not present

## 2023-06-17 DIAGNOSIS — L814 Other melanin hyperpigmentation: Secondary | ICD-10-CM | POA: Diagnosis not present

## 2023-06-17 DIAGNOSIS — Z85828 Personal history of other malignant neoplasm of skin: Secondary | ICD-10-CM | POA: Diagnosis not present

## 2023-06-17 DIAGNOSIS — B351 Tinea unguium: Secondary | ICD-10-CM | POA: Diagnosis not present

## 2023-06-17 DIAGNOSIS — Z08 Encounter for follow-up examination after completed treatment for malignant neoplasm: Secondary | ICD-10-CM | POA: Diagnosis not present

## 2023-06-20 ENCOUNTER — Encounter: Payer: Self-pay | Admitting: Family Medicine

## 2023-06-20 ENCOUNTER — Ambulatory Visit (INDEPENDENT_AMBULATORY_CARE_PROVIDER_SITE_OTHER): Payer: Medicare Other | Admitting: Family Medicine

## 2023-06-20 VITALS — BP 118/60 | HR 75 | Temp 98.4°F | Ht 66.0 in | Wt 210.0 lb

## 2023-06-20 DIAGNOSIS — Z23 Encounter for immunization: Secondary | ICD-10-CM

## 2023-06-20 DIAGNOSIS — E785 Hyperlipidemia, unspecified: Secondary | ICD-10-CM | POA: Diagnosis not present

## 2023-06-20 DIAGNOSIS — D649 Anemia, unspecified: Secondary | ICD-10-CM | POA: Diagnosis not present

## 2023-06-20 DIAGNOSIS — R739 Hyperglycemia, unspecified: Secondary | ICD-10-CM

## 2023-06-20 DIAGNOSIS — M170 Bilateral primary osteoarthritis of knee: Secondary | ICD-10-CM | POA: Diagnosis not present

## 2023-06-20 DIAGNOSIS — K219 Gastro-esophageal reflux disease without esophagitis: Secondary | ICD-10-CM

## 2023-06-20 DIAGNOSIS — R609 Edema, unspecified: Secondary | ICD-10-CM | POA: Diagnosis not present

## 2023-06-20 DIAGNOSIS — G4762 Sleep related leg cramps: Secondary | ICD-10-CM | POA: Insufficient documentation

## 2023-06-20 DIAGNOSIS — M5431 Sciatica, right side: Secondary | ICD-10-CM

## 2023-06-20 LAB — BASIC METABOLIC PANEL
BUN: 19 mg/dL (ref 6–23)
CO2: 32 meq/L (ref 19–32)
Calcium: 9.9 mg/dL (ref 8.4–10.5)
Chloride: 101 meq/L (ref 96–112)
Creatinine, Ser: 0.82 mg/dL (ref 0.40–1.20)
GFR: 65.99 mL/min (ref >60.00)
Glucose, Bld: 90 mg/dL (ref 70–99)
Potassium: 4 meq/L (ref 3.5–5.1)
Sodium: 140 meq/L (ref 135–145)

## 2023-06-20 LAB — CBC WITH DIFFERENTIAL/PLATELET
Basophils Absolute: 0.1 10*3/uL (ref 0.0–0.1)
Basophils Relative: 1 % (ref 0.0–3.0)
Eosinophils Absolute: 0.2 10*3/uL (ref 0.0–0.7)
Eosinophils Relative: 2.6 % (ref 0.0–5.0)
HCT: 34.8 % — ABNORMAL LOW (ref 36.0–46.0)
Hemoglobin: 11.1 g/dL — ABNORMAL LOW (ref 12.0–15.0)
Lymphocytes Relative: 22.9 % (ref 12.0–46.0)
Lymphs Abs: 1.8 10*3/uL (ref 0.7–4.0)
MCHC: 32 g/dL (ref 30.0–36.0)
MCV: 89.1 fl (ref 78.0–100.0)
Monocytes Absolute: 0.7 10*3/uL (ref 0.1–1.0)
Monocytes Relative: 8.6 % (ref 3.0–12.0)
Neutro Abs: 5 10*3/uL (ref 1.4–7.7)
Neutrophils Relative %: 64.9 % (ref 43.0–77.0)
Platelets: 380 10*3/uL (ref 150.0–400.0)
RBC: 3.9 Mil/uL (ref 3.87–5.11)
RDW: 15.3 % (ref 11.5–15.5)
WBC: 7.7 10*3/uL (ref 4.0–10.5)

## 2023-06-20 LAB — LIPID PANEL
Cholesterol: 165 mg/dL (ref 0–200)
HDL: 64.6 mg/dL (ref >39.00)
LDL Cholesterol: 81 mg/dL (ref 0–99)
NonHDL: 100.05
Total CHOL/HDL Ratio: 3
Triglycerides: 94 mg/dL (ref 0.0–149.0)
VLDL: 18.8 mg/dL (ref 0.0–40.0)

## 2023-06-20 LAB — HEPATIC FUNCTION PANEL
ALT: 9 U/L (ref 0–35)
AST: 16 U/L (ref 0–37)
Albumin: 3.8 g/dL (ref 3.5–5.2)
Alkaline Phosphatase: 81 U/L (ref 39–117)
Bilirubin, Direct: 0.1 mg/dL (ref 0.0–0.3)
Total Bilirubin: 0.7 mg/dL (ref 0.2–1.2)
Total Protein: 7.6 g/dL (ref 6.0–8.3)

## 2023-06-20 LAB — HEMOGLOBIN A1C: Hgb A1c MFr Bld: 5.9 % (ref 4.6–6.5)

## 2023-06-20 LAB — TSH: TSH: 2.28 u[IU]/mL (ref 0.35–5.50)

## 2023-06-20 MED ORDER — ALPRAZOLAM 0.25 MG PO TABS: 0.2500 mg | ORAL_TABLET | Freq: Every evening | ORAL | 1 refills | Status: DC | PRN

## 2023-06-20 MED ORDER — OMEPRAZOLE 40 MG PO CPDR: 40.0000 mg | DELAYED_RELEASE_CAPSULE | Freq: Every day | ORAL | 3 refills | Status: DC

## 2023-06-20 NOTE — Progress Notes (Signed)
Subjective:    Patient ID: Crystal Little, female    DOB: 11-Mar-1941, 82 y.o.   MRN: [id number]  HPI Here to follow up on issues. She is doing well in general. She had a left total knee replacement on 04-18-23, and this went very well. She plans on having the right knee replaced next summer. Her leg edema is well controlled. Her GERD and IBS are stable. She does mention frequent cramps in her calves when she is still for periods of time, such as when watching TV or lying in bed.    Review of Systems  Constitutional: Negative.   HENT: Negative.    Eyes: Negative.   Respiratory: Negative.    Cardiovascular: Negative.   Gastrointestinal: Negative.   Genitourinary:  Negative for decreased urine volume, difficulty urinating, dyspareunia, dysuria, enuresis, flank pain, frequency, hematuria, pelvic pain and urgency.  Musculoskeletal:  Positive for arthralgias.  Skin: Negative.   Neurological: Negative.  Negative for headaches.  Psychiatric/Behavioral: Negative.         Objective:   Physical Exam Constitutional:      General: She is not in acute distress.    Appearance: She is well-developed. She is obese.  HENT:     Head: Normocephalic and atraumatic.     Right Ear: External ear normal.     Left Ear: External ear normal.     Nose: Nose normal.     Mouth/Throat:     Pharynx: No oropharyngeal exudate.  Eyes:     General: No scleral icterus.    Conjunctiva/sclera: Conjunctivae normal.     Pupils: Pupils are equal, round, and reactive to light.  Neck:     Thyroid: No thyromegaly.     Vascular: No JVD.  Cardiovascular:     Rate and Rhythm: Normal rate and regular rhythm.     Pulses: Normal pulses.     Heart sounds: Normal heart sounds. No murmur heard.    No friction rub. No gallop.     Comments: Occasional ectopic beats  Pulmonary:     Effort: Pulmonary effort is normal. No respiratory distress.     Breath sounds: Normal breath sounds. No wheezing or rales.  Chest:      Chest wall: No tenderness.  Abdominal:     General: Bowel sounds are normal. There is no distension.     Palpations: Abdomen is soft. There is no mass.     Tenderness: There is no abdominal tenderness. There is no guarding or rebound.  Musculoskeletal:        General: No tenderness. Normal range of motion.     Cervical back: Normal range of motion and neck supple.     Comments: 2+ edema in both ankles   Lymphadenopathy:     Cervical: No cervical adenopathy.  Skin:    General: Skin is warm and dry.     Findings: No erythema or rash.  Neurological:     General: No focal deficit present.     Mental Status: She is alert and oriented to person, place, and time.     Cranial Nerves: No cranial nerve deficit.     Motor: No abnormal muscle tone.     Coordination: Coordination normal.     Deep Tendon Reflexes: Reflexes are normal and symmetric. Reflexes normal.  Psychiatric:        Mood and Affect: Mood normal.        Behavior: Behavior normal.        Thought Content: Thought  content normal.        Judgment: Judgment normal.           Assessment & Plan:  She has recovered well from knee replacement surgery. Her GERD and IBS are well controlled. Her leg edema is stable. I suggested she try taking 400-800 mg of magnesium in the evenings for the leg cramps. Get fasting labs to check lipids, Hgb, renal function, etc. We spent a total of ( 35  ) minutes reviewing records and discussing these issues.  Gershon Crane, MD

## 2023-06-21 DIAGNOSIS — M25662 Stiffness of left knee, not elsewhere classified: Secondary | ICD-10-CM | POA: Diagnosis not present

## 2023-06-21 DIAGNOSIS — M25562 Pain in left knee: Secondary | ICD-10-CM | POA: Diagnosis not present

## 2023-06-23 DIAGNOSIS — M25662 Stiffness of left knee, not elsewhere classified: Secondary | ICD-10-CM | POA: Diagnosis not present

## 2023-06-23 DIAGNOSIS — M25562 Pain in left knee: Secondary | ICD-10-CM | POA: Diagnosis not present

## 2023-06-23 MED ORDER — IRON (FERROUS SULFATE) 325 (65 FE) MG PO TABS: 325.0000 mg | ORAL_TABLET | Freq: Every day | ORAL | 3 refills | Status: DC

## 2023-06-23 NOTE — Addendum Note (Signed)
Addended by: Johnella Moloney on: 06/23/2023 10:36 AM   Modules accepted: Orders

## 2023-07-01 ENCOUNTER — Encounter: Payer: Self-pay | Admitting: Family Medicine

## 2023-07-19 DIAGNOSIS — Z1231 Encounter for screening mammogram for malignant neoplasm of breast: Secondary | ICD-10-CM | POA: Diagnosis not present

## 2023-07-20 ENCOUNTER — Other Ambulatory Visit: Payer: Self-pay | Admitting: Obstetrics and Gynecology

## 2023-07-20 DIAGNOSIS — Z13820 Encounter for screening for osteoporosis: Secondary | ICD-10-CM

## 2023-08-29 DIAGNOSIS — Z96652 Presence of left artificial knee joint: Secondary | ICD-10-CM | POA: Diagnosis not present

## 2023-09-06 DIAGNOSIS — Z96652 Presence of left artificial knee joint: Secondary | ICD-10-CM | POA: Diagnosis not present

## 2023-09-06 DIAGNOSIS — Z471 Aftercare following joint replacement surgery: Secondary | ICD-10-CM | POA: Diagnosis not present

## 2023-09-20 DIAGNOSIS — Z96652 Presence of left artificial knee joint: Secondary | ICD-10-CM | POA: Diagnosis not present

## 2023-09-20 HISTORY — PX: TOOTH EXTRACTION: SUR596

## 2023-10-06 DIAGNOSIS — Z96652 Presence of left artificial knee joint: Secondary | ICD-10-CM | POA: Diagnosis not present

## 2023-10-06 DIAGNOSIS — S76112D Strain of left quadriceps muscle, fascia and tendon, subsequent encounter: Secondary | ICD-10-CM | POA: Diagnosis not present

## 2023-10-11 NOTE — Progress Notes (Signed)
 Sent message, via epic in basket, requesting orders in epic from Careers adviser.

## 2023-10-12 NOTE — Progress Notes (Addendum)
 COVID Vaccine Completed: yes  Date of COVID positive in last 90 days: no  PCP - Garnette Olmsted, MD Cardiologist - n/a  Chest x-ray - n/a EKG - n/a Stress Test - n/a ECHO - 09/11/19 Epic Cardiac Cath - n/a Pacemaker/ICD device last checked: n/a Spinal Cord Stimulator: n/a  Bowel Prep - no  Sleep Study - n/a CPAP -   Fasting Blood Sugar - n/a Checks Blood Sugar _____ times a day  Last dose of GLP1 agonist-  N/A GLP1 instructions:  Hold 7 days before surgery    Last dose of SGLT-2 inhibitors-  N/A SGLT-2 instructions:  Hold 3 days before surgery    Blood Thinner Instructions: n/a Aspirin  Instructions: Last Dose:  Activity level: Can go up a flight of stairs and perform activities of daily living without stopping and without symptoms of chest pain or shortness of breath. Ambulates with cane  Anesthesia review:   Patient denies shortness of breath, fever, cough and chest pain at PAT appointment  Patient verbalized understanding of instructions that were given to them at the PAT appointment. Patient was also instructed that they will need to review over the PAT instructions again at home before surgery.

## 2023-10-12 NOTE — Patient Instructions (Addendum)
 SURGICAL WAITING ROOM VISITATION  Patients having surgery or a procedure may have no more than 2 support people in the waiting area - these visitors may rotate.    Children under the age of 84 must have an adult with them who is not the patient.  Due to an increase in RSV and influenza rates and associated hospitalizations, children ages 87 and under may not visit patients in [facility] hospitals.  If the patient needs to stay at the hospital during part of their recovery, the visitor guidelines for inpatient rooms apply. Pre-op nurse will coordinate an appropriate time for 1 support person to accompany patient in pre-op.  This support person may not rotate.    Please refer to the [facility] website for the visitor guidelines for Inpatients (after your surgery is over and you are in a regular room).    Your procedure is scheduled on: 10/19/23   Report to [facility] Main Entrance    Report to admitting at 2:00 PM   Call this number if you have problems the morning of surgery [phone]   Do not eat food :After Midnight.   After Midnight you may have the following liquids until 1:15 PM DAY OF SURGERY  Water  Non-Citrus Juices (without pulp, NO RED-Apple, White grape, White cranberry) Black Coffee (NO MILK/CREAM OR CREAMERS, sugar ok)  Clear Tea (NO MILK/CREAM OR CREAMERS, sugar ok) regular and decaf                             Plain Jell-O (NO RED)                                           Fruit ices (not with fruit pulp, NO RED)                                     Popsicles (NO RED)                                                               Sports drinks like Gatorade (NO RED)                The day of surgery:  Drink ONE (1) Pre-Surgery Clear Ensure at 1:15 PM the morning of surgery. Drink in one sitting. Do not sip.  This drink was given to you during your hospital  pre-op appointment visit. Nothing else to drink after completing the  Pre-Surgery Clear Ensure.           If you have questions, please contact your surgeon's office.   FOLLOW BOWEL PREP AND ANY ADDITIONAL PRE OP INSTRUCTIONS YOU RECEIVED FROM YOUR SURGEON'S OFFICE!!!     Oral Hygiene is also important to reduce your risk of infection.                                    Remember - BRUSH YOUR TEETH THE MORNING OF SURGERY WITH YOUR REGULAR TOOTHPASTE  DENTURES WILL BE  REMOVED PRIOR TO SURGERY PLEASE DO NOT APPLY Poly grip OR ADHESIVES!!!   Stop all vitamins and herbal supplements 7 days before surgery.   Take these medicines the morning of surgery with A SIP OF WATER : Omeprazole                                You may not have any metal on your body including hair pins, jewelry, and body piercing             Do not wear make-up, lotions, powders, perfumes, or deodorant  Do not wear nail polish including gel and S&S, artificial/acrylic nails, or any other type of covering on natural nails including finger and toenails. If you have artificial nails, gel coating, etc. that needs to be removed by a nail salon please have this removed prior to surgery or surgery may need to be canceled/ delayed if the surgeon/ anesthesia feels like they are unable to be safely monitored.   Do not shave  48 hours prior to surgery.    Do not bring valuables to the hospital. [place] IS NOT             RESPONSIBLE   FOR VALUABLES.   Contacts, glasses, dentures or bridgework may not be worn into surgery.   Bring small overnight bag day of surgery.   DO NOT BRING YOUR HOME MEDICATIONS TO THE HOSPITAL. PHARMACY WILL DISPENSE MEDICATIONS LISTED ON YOUR MEDICATION LIST TO YOU DURING YOUR ADMISSION IN THE HOSPITAL!              Please read over the following fact sheets you were given: IF YOU HAVE QUESTIONS ABOUT YOUR PRE-OP INSTRUCTIONS PLEASE CALL [phone] [place]    If you received a COVID test during your pre-op visit  it is requested that you wear a mask when out in public, stay away from anyone  that may not be feeling well and notify your surgeon if you develop symptoms. If you test positive for Covid or have been in contact with anyone that has tested positive in the last 10 days please notify you surgeon.    [place] - Preparing for Surgery Before surgery, you can play an important role.  Because skin is not sterile, your skin needs to be as free of germs as possible.  You can reduce the number of germs on your skin by washing with CHG (chlorahexidine gluconate) soap before surgery.  CHG is an antiseptic cleaner which kills germs and bonds with the skin to continue killing germs even after washing. Please DO NOT use if you have an allergy to CHG or antibacterial soaps.  If your skin becomes reddened/irritated stop using the CHG and inform your nurse when you arrive at Short Stay. Do not shave (including legs and underarms) for at least 48 hours prior to the first CHG shower.  You may shave your face/neck.  Please follow these instructions carefully:  1.  Shower with CHG Soap the night before surgery and the  morning of surgery.  2.  If you choose to wash your hair, wash your hair first as usual with your normal  shampoo.  3.  After you shampoo, rinse your hair and body thoroughly to remove the shampoo.                             4.  Use CHG as  you would any other liquid soap.  You can apply chg directly to the skin and wash.  Gently with a scrungie or clean washcloth.  5.  Apply the CHG Soap to your body ONLY FROM THE NECK DOWN.   Do   not use on face/ open                           Wound or open sores. Avoid contact with eyes, ears mouth and   genitals (private parts).                       Wash face,  Genitals (private parts) with your normal soap.             6.  Wash thoroughly, paying special attention to the area where your    surgery  will be performed.  7.  Thoroughly rinse your body with warm water  from the neck down.  8.  DO NOT shower/wash with your normal soap after using  and rinsing off the CHG Soap.                9.  Pat yourself dry with a clean towel.            10.  Wear clean pajamas.            11.  Place clean sheets on your bed the night of your first shower and do not  sleep with pets. Day of Surgery : Do not apply any lotions/deodorants the morning of surgery.  Please wear clean clothes to the hospital/surgery center.  FAILURE TO FOLLOW THESE INSTRUCTIONS MAY RESULT IN THE CANCELLATION OF YOUR SURGERY  PATIENT SIGNATURE_________________________________  NURSE SIGNATURE__________________________________  ________________________________________________________________________  Crystal Little  An incentive spirometer is a tool that can help keep your lungs clear and active. This tool measures how well you are filling your lungs with each breath. Taking long deep breaths may help reverse or decrease the chance of developing breathing (pulmonary) problems (especially infection) following: A long period of time when you are unable to move or be active. BEFORE THE PROCEDURE  If the spirometer includes an indicator to show your best effort, your nurse or respiratory therapist will set it to a desired goal. If possible, sit up straight or lean slightly forward. Try not to slouch. Hold the incentive spirometer in an upright position. INSTRUCTIONS FOR USE  Sit on the edge of your bed if possible, or sit up as far as you can in bed or on a chair. Hold the incentive spirometer in an upright position. Breathe out normally. Place the mouthpiece in your mouth and seal your lips tightly around it. Breathe in slowly and as deeply as possible, raising the piston or the ball toward the top of the column. Hold your breath for 3-5 seconds or for as long as possible. Allow the piston or ball to fall to the bottom of the column. Remove the mouthpiece from your mouth and breathe out normally. Rest for a few seconds and repeat Steps 1 through 7 at least 10  times every 1-2 hours when you are awake. Take your time and take a few normal breaths between deep breaths. The spirometer may include an indicator to show your best effort. Use the indicator as a goal to work toward during each repetition. After each set of 10 deep breaths, practice coughing to be sure your lungs are clear. If you  have an incision (the cut made at the time of surgery), support your incision when coughing by placing a pillow or rolled up towels firmly against it. Once you are able to get out of bed, walk around indoors and cough well. You may stop using the incentive spirometer when instructed by your caregiver.  RISKS AND COMPLICATIONS Take your time so you do not get dizzy or light-headed. If you are in pain, you may need to take or ask for pain medication before doing incentive spirometry. It is harder to take a deep breath if you are having pain. AFTER USE Rest and breathe slowly and easily. It can be helpful to keep track of a log of your progress. Your caregiver can provide you with a simple table to help with this. If you are using the spirometer at home, follow these instructions: SEEK MEDICAL CARE IF:  You are having difficultly using the spirometer. You have trouble using the spirometer as often as instructed. Your pain medication is not giving enough relief while using the spirometer. You develop fever of 100.5 F (38.1 C) or higher. SEEK IMMEDIATE MEDICAL CARE IF:  You cough up bloody sputum that had not been present before. You develop fever of 102 F (38.9 C) or greater. You develop worsening pain at or near the incision site. MAKE SURE YOU:  Understand these instructions. Will watch your condition. Will get help right away if you are not doing well or get worse. Document Released: 01/31/2007 Document Revised: 12/13/2011 Document Reviewed: 04/03/2007 [facility] Patient Information 2014 [place],  [place].   ________________________________________________________________________

## 2023-10-13 ENCOUNTER — Other Ambulatory Visit: Payer: Self-pay

## 2023-10-13 ENCOUNTER — Encounter (HOSPITAL_COMMUNITY)
Admission: RE | Admit: 2023-10-13 | Discharge: 2023-10-13 | Disposition: A | Discharge status: Home / Self Care | Payer: Medicare Other | Source: Ambulatory Visit | Attending: Orthopedic Surgery | Admitting: Orthopedic Surgery

## 2023-10-13 ENCOUNTER — Encounter (HOSPITAL_COMMUNITY): Payer: Self-pay

## 2023-10-13 VITALS — BP 143/63 | HR 87 | Temp 97.8°F | Resp 14 | Ht 65.5 in | Wt 209.0 lb

## 2023-10-13 DIAGNOSIS — Z01812 Encounter for preprocedural laboratory examination: Secondary | ICD-10-CM | POA: Insufficient documentation

## 2023-10-13 DIAGNOSIS — D649 Anemia, unspecified: Secondary | ICD-10-CM | POA: Diagnosis not present

## 2023-10-13 DIAGNOSIS — Z01818 Encounter for other preprocedural examination: Secondary | ICD-10-CM | POA: Diagnosis present

## 2023-10-13 HISTORY — DX: Anxiety disorder, unspecified: F41.9

## 2023-10-13 LAB — CBC
HCT: 36.4 % (ref 36.0–46.0)
Hemoglobin: 11.8 g/dL — ABNORMAL LOW (ref 12.0–15.0)
MCH: 29.9 pg (ref 26.0–34.0)
MCHC: 32.4 g/dL (ref 30.0–36.0)
MCV: 92.2 fL (ref 80.0–100.0)
Platelets: 302 10*3/uL (ref 150–400)
RBC: 3.95 MIL/uL (ref 3.87–5.11)
RDW: 15 % (ref 11.5–15.5)
WBC: 8.7 10*3/uL (ref 4.0–10.5)
nRBC: 0 % (ref 0.0–0.2)

## 2023-10-19 ENCOUNTER — Encounter (HOSPITAL_COMMUNITY)
Admission: RE | Disposition: A | Discharge status: Home / Self Care | Payer: Self-pay | Source: Ambulatory Visit | Attending: Orthopedic Surgery

## 2023-10-19 ENCOUNTER — Observation Stay (HOSPITAL_COMMUNITY)
Admission: RE | Admit: 2023-10-19 | Discharge: 2023-10-21 | Disposition: A | Discharge status: Home / Self Care | Payer: Medicare Other | Source: Ambulatory Visit | Attending: Orthopedic Surgery | Admitting: Orthopedic Surgery

## 2023-10-19 ENCOUNTER — Other Ambulatory Visit: Payer: Self-pay

## 2023-10-19 ENCOUNTER — Ambulatory Visit (HOSPITAL_COMMUNITY): Payer: Medicare Other | Admitting: Anesthesiology

## 2023-10-19 ENCOUNTER — Encounter (HOSPITAL_COMMUNITY): Payer: Self-pay | Admitting: Orthopedic Surgery

## 2023-10-19 DIAGNOSIS — S86812A Strain of other muscle(s) and tendon(s) at lower leg level, left leg, initial encounter: Principal | ICD-10-CM | POA: Diagnosis present

## 2023-10-19 DIAGNOSIS — Z96652 Presence of left artificial knee joint: Secondary | ICD-10-CM | POA: Insufficient documentation

## 2023-10-19 DIAGNOSIS — S86819A Strain of other muscle(s) and tendon(s) at lower leg level, unspecified leg, initial encounter: Secondary | ICD-10-CM | POA: Diagnosis present

## 2023-10-19 DIAGNOSIS — G8918 Other acute postprocedural pain: Secondary | ICD-10-CM | POA: Diagnosis not present

## 2023-10-19 DIAGNOSIS — X58XXXA Exposure to other specified factors, initial encounter: Secondary | ICD-10-CM | POA: Diagnosis not present

## 2023-10-19 DIAGNOSIS — Z96659 Presence of unspecified artificial knee joint: Principal | ICD-10-CM

## 2023-10-19 DIAGNOSIS — M978XXA Periprosthetic fracture around other internal prosthetic joint, initial encounter: Principal | ICD-10-CM

## 2023-10-19 HISTORY — PX: PATELLAR TENDON REPAIR: SHX737

## 2023-10-19 SURGERY — REPAIR, TENDON, PATELLAR
Anesthesia: General | Site: Knee | Laterality: Left

## 2023-10-19 MED ORDER — BUPIVACAINE-EPINEPHRINE (PF) 0.5% -1:200000 IJ SOLN
INTRAMUSCULAR | Status: DC | PRN
  Administered 2023-10-19: 30 mL via PERINEURAL

## 2023-10-19 MED ORDER — OXYCODONE HCL 5 MG PO TABS
5.0000 mg | ORAL_TABLET | ORAL | Status: DC | PRN
Start: 2023-10-19 — End: 2023-10-21
  Administered 2023-10-19 – 2023-10-20 (×2): 5 mg via ORAL
  Administered 2023-10-20: 10 mg via ORAL
  Administered 2023-10-20 – 2023-10-21 (×4): 5 mg via ORAL
  Filled 2023-10-19 (×3): qty 1
  Filled 2023-10-19: qty 2
  Filled 2023-10-19 (×3): qty 1

## 2023-10-19 MED ORDER — SODIUM CHLORIDE 0.9 % IV SOLN: INTRAVENOUS | Status: DC

## 2023-10-19 MED ORDER — PROPOFOL 500 MG/50ML IV EMUL
INTRAVENOUS | Status: AC
  Filled 2023-10-19: qty 50

## 2023-10-19 MED ORDER — DEXAMETHASONE SODIUM PHOSPHATE 10 MG/ML IJ SOLN
INTRAMUSCULAR | Status: AC
  Filled 2023-10-19: qty 1

## 2023-10-19 MED ORDER — DOCUSATE SODIUM 100 MG PO CAPS
100.0000 mg | ORAL_CAPSULE | Freq: Two times a day (BID) | ORAL | Status: DC
  Administered 2023-10-19 – 2023-10-21 (×4): 100 mg via ORAL
  Filled 2023-10-19 (×4): qty 1

## 2023-10-19 MED ORDER — FENTANYL CITRATE (PF) 100 MCG/2ML IJ SOLN
INTRAMUSCULAR | Status: DC | PRN
  Administered 2023-10-19 (×4): 50 ug via INTRAVENOUS

## 2023-10-19 MED ORDER — DEXAMETHASONE SODIUM PHOSPHATE 4 MG/ML IJ SOLN
INTRAMUSCULAR | Status: DC | PRN
  Administered 2023-10-19: 5 mg via INTRAVENOUS

## 2023-10-19 MED ORDER — BISACODYL 10 MG RE SUPP: 10.0000 mg | Freq: Every day | RECTAL | Status: DC | PRN

## 2023-10-19 MED ORDER — ACETAMINOPHEN 325 MG PO TABS: 325.0000 mg | ORAL_TABLET | Freq: Four times a day (QID) | ORAL | Status: DC | PRN

## 2023-10-19 MED ORDER — PROPOFOL 10 MG/ML IV BOLUS
INTRAVENOUS | Status: AC
  Filled 2023-10-19: qty 20

## 2023-10-19 MED ORDER — CHLORHEXIDINE GLUCONATE 0.12 % MT SOLN
15.0000 mL | Freq: Once | OROMUCOSAL | Status: DC
Start: 2023-10-19 — End: 2023-10-19

## 2023-10-19 MED ORDER — FLEET ENEMA RE ENEM: 1.0000 | ENEMA | Freq: Once | RECTAL | Status: DC | PRN

## 2023-10-19 MED ORDER — FERROUS SULFATE 325 (65 FE) MG PO TABS
325.0000 mg | ORAL_TABLET | Freq: Every day | ORAL | Status: DC
  Administered 2023-10-20 – 2023-10-21 (×2): 325 mg via ORAL
  Filled 2023-10-19 (×2): qty 1

## 2023-10-19 MED ORDER — CEFAZOLIN SODIUM-DEXTROSE 2-4 GM/100ML-% IV SOLN
2.0000 g | INTRAVENOUS | Status: AC
  Administered 2023-10-19: 2 g via INTRAVENOUS
  Filled 2023-10-19: qty 100

## 2023-10-19 MED ORDER — DEXAMETHASONE SODIUM PHOSPHATE 10 MG/ML IJ SOLN
10.0000 mg | Freq: Once | INTRAMUSCULAR | Status: AC
  Administered 2023-10-20: 10 mg via INTRAVENOUS
  Filled 2023-10-19: qty 1

## 2023-10-19 MED ORDER — PROPOFOL 500 MG/50ML IV EMUL
INTRAVENOUS | Status: DC | PRN
  Administered 2023-10-19: 125 ug/kg/min via INTRAVENOUS

## 2023-10-19 MED ORDER — LIDOCAINE 2% (20 MG/ML) 5 ML SYRINGE
INTRAMUSCULAR | Status: DC | PRN
  Administered 2023-10-19: 60 mg via INTRAVENOUS

## 2023-10-19 MED ORDER — PHENOL 1.4 % MT LIQD: 1.0000 | OROMUCOSAL | Status: DC | PRN

## 2023-10-19 MED ORDER — PANTOPRAZOLE SODIUM 40 MG PO TBEC
80.0000 mg | DELAYED_RELEASE_TABLET | Freq: Every day | ORAL | Status: DC
Start: 2023-10-20 — End: 2023-10-21
  Administered 2023-10-20 – 2023-10-21 (×2): 80 mg via ORAL
  Filled 2023-10-19 (×2): qty 2

## 2023-10-19 MED ORDER — FENTANYL CITRATE PF 50 MCG/ML IJ SOSY
50.0000 ug | PREFILLED_SYRINGE | INTRAMUSCULAR | Status: DC
Start: 2023-10-19 — End: 2023-10-19
  Administered 2023-10-19: 50 ug via INTRAVENOUS
  Filled 2023-10-19: qty 1

## 2023-10-19 MED ORDER — FENTANYL CITRATE PF 50 MCG/ML IJ SOSY
25.0000 ug | PREFILLED_SYRINGE | INTRAMUSCULAR | Status: DC | PRN
  Administered 2023-10-19 (×3): 50 ug via INTRAVENOUS

## 2023-10-19 MED ORDER — TRAMADOL HCL 50 MG PO TABS
50.0000 mg | ORAL_TABLET | Freq: Four times a day (QID) | ORAL | Status: DC | PRN
Start: 2023-10-19 — End: 2023-10-21
  Administered 2023-10-20 – 2023-10-21 (×2): 100 mg via ORAL
  Filled 2023-10-19: qty 2
  Filled 2023-10-19 (×2): qty 1

## 2023-10-19 MED ORDER — METOCLOPRAMIDE HCL 5 MG PO TABS: 5.0000 mg | ORAL_TABLET | Freq: Three times a day (TID) | ORAL | Status: DC | PRN

## 2023-10-19 MED ORDER — DEXAMETHASONE SODIUM PHOSPHATE 10 MG/ML IJ SOLN: 8.0000 mg | Freq: Once | INTRAMUSCULAR | Status: DC

## 2023-10-19 MED ORDER — METHOCARBAMOL 1000 MG/10ML IJ SOLN: 500.0000 mg | Freq: Four times a day (QID) | INTRAMUSCULAR | Status: DC | PRN

## 2023-10-19 MED ORDER — OXYCODONE HCL 5 MG PO TABS
5.0000 mg | ORAL_TABLET | Freq: Once | ORAL | Status: DC | PRN
Start: 2023-10-19 — End: 2023-10-19

## 2023-10-19 MED ORDER — FENTANYL CITRATE (PF) 100 MCG/2ML IJ SOLN
INTRAMUSCULAR | Status: AC
  Filled 2023-10-19: qty 2

## 2023-10-19 MED ORDER — ASPIRIN 81 MG PO CHEW
81.0000 mg | CHEWABLE_TABLET | Freq: Two times a day (BID) | ORAL | Status: DC
  Administered 2023-10-20 – 2023-10-21 (×3): 81 mg via ORAL
  Filled 2023-10-19 (×3): qty 1

## 2023-10-19 MED ORDER — ONDANSETRON HCL 4 MG PO TABS: 4.0000 mg | ORAL_TABLET | Freq: Four times a day (QID) | ORAL | Status: DC | PRN

## 2023-10-19 MED ORDER — ACETAMINOPHEN 500 MG PO TABS
1000.0000 mg | ORAL_TABLET | Freq: Four times a day (QID) | ORAL | Status: AC
  Administered 2023-10-19 – 2023-10-20 (×4): 1000 mg via ORAL
  Filled 2023-10-19 (×4): qty 2

## 2023-10-19 MED ORDER — FENTANYL CITRATE PF 50 MCG/ML IJ SOSY
PREFILLED_SYRINGE | INTRAMUSCULAR | Status: AC
  Filled 2023-10-19: qty 1

## 2023-10-19 MED ORDER — DIPHENHYDRAMINE HCL 12.5 MG/5ML PO ELIX: 12.5000 mg | ORAL_SOLUTION | ORAL | Status: DC | PRN

## 2023-10-19 MED ORDER — FENTANYL CITRATE PF 50 MCG/ML IJ SOSY
PREFILLED_SYRINGE | INTRAMUSCULAR | Status: AC
  Filled 2023-10-19: qty 2

## 2023-10-19 MED ORDER — ONDANSETRON HCL 4 MG/2ML IJ SOLN: 4.0000 mg | Freq: Once | INTRAMUSCULAR | Status: DC | PRN

## 2023-10-19 MED ORDER — BUPIVACAINE HCL 0.25 % IJ SOLN
INTRAMUSCULAR | Status: AC
  Filled 2023-10-19: qty 1

## 2023-10-19 MED ORDER — ALPRAZOLAM 0.25 MG PO TABS
0.2500 mg | ORAL_TABLET | Freq: Every day | ORAL | Status: DC
  Administered 2023-10-19 – 2023-10-20 (×2): 0.25 mg via ORAL
  Filled 2023-10-19 (×2): qty 1

## 2023-10-19 MED ORDER — METHOCARBAMOL 500 MG PO TABS
500.0000 mg | ORAL_TABLET | Freq: Four times a day (QID) | ORAL | Status: DC | PRN
  Administered 2023-10-19 – 2023-10-21 (×5): 500 mg via ORAL
  Filled 2023-10-19 (×5): qty 1

## 2023-10-19 MED ORDER — METOCLOPRAMIDE HCL 5 MG/ML IJ SOLN: 5.0000 mg | Freq: Three times a day (TID) | INTRAMUSCULAR | Status: DC | PRN

## 2023-10-19 MED ORDER — MENTHOL 3 MG MT LOZG
1.0000 | LOZENGE | OROMUCOSAL | Status: DC | PRN
Start: 2023-10-19 — End: 2023-10-21

## 2023-10-19 MED ORDER — ONDANSETRON HCL 4 MG/2ML IJ SOLN
INTRAMUSCULAR | Status: AC
  Filled 2023-10-19: qty 2

## 2023-10-19 MED ORDER — LACTATED RINGERS IV SOLN: INTRAVENOUS | Status: DC

## 2023-10-19 MED ORDER — ONDANSETRON HCL 4 MG/2ML IJ SOLN: 4.0000 mg | Freq: Four times a day (QID) | INTRAMUSCULAR | Status: DC | PRN

## 2023-10-19 MED ORDER — ACETAMINOPHEN 10 MG/ML IV SOLN
1000.0000 mg | Freq: Four times a day (QID) | INTRAVENOUS | Status: DC
  Administered 2023-10-19: 1000 mg via INTRAVENOUS
  Filled 2023-10-19: qty 100

## 2023-10-19 MED ORDER — POLYETHYLENE GLYCOL 3350 17 G PO PACK: 17.0000 g | PACK | Freq: Every day | ORAL | Status: DC | PRN

## 2023-10-19 MED ORDER — OXYCODONE HCL 5 MG/5ML PO SOLN: 5.0000 mg | Freq: Once | ORAL | Status: DC | PRN

## 2023-10-19 MED ORDER — TORSEMIDE 20 MG PO TABS
40.0000 mg | ORAL_TABLET | Freq: Every day | ORAL | Status: DC
  Administered 2023-10-21: 40 mg via ORAL
  Filled 2023-10-19: qty 2

## 2023-10-19 MED ORDER — PHENYLEPHRINE 80 MCG/ML (10ML) SYRINGE FOR IV PUSH (FOR BLOOD PRESSURE SUPPORT)
PREFILLED_SYRINGE | INTRAVENOUS | Status: DC | PRN
  Administered 2023-10-19: 120 ug via INTRAVENOUS

## 2023-10-19 MED ORDER — CEFAZOLIN SODIUM-DEXTROSE 2-4 GM/100ML-% IV SOLN
2.0000 g | Freq: Four times a day (QID) | INTRAVENOUS | Status: AC
  Administered 2023-10-19 – 2023-10-20 (×2): 2 g via INTRAVENOUS
  Filled 2023-10-19 (×2): qty 100

## 2023-10-19 MED ORDER — PHENYLEPHRINE 80 MCG/ML (10ML) SYRINGE FOR IV PUSH (FOR BLOOD PRESSURE SUPPORT)
PREFILLED_SYRINGE | INTRAVENOUS | Status: AC
Start: 2023-10-19 — End: ?
  Filled 2023-10-19: qty 10

## 2023-10-19 MED ORDER — ACETAMINOPHEN 500 MG PO TABS: 1000.0000 mg | ORAL_TABLET | Freq: Once | ORAL | Status: DC

## 2023-10-19 MED ORDER — ORAL CARE MOUTH RINSE: 15.0000 mL | Freq: Once | OROMUCOSAL | Status: DC

## 2023-10-19 MED ORDER — PROPOFOL 10 MG/ML IV BOLUS
INTRAVENOUS | Status: DC | PRN
  Administered 2023-10-19: 130 mg via INTRAVENOUS

## 2023-10-19 MED ORDER — ONDANSETRON HCL 4 MG/2ML IJ SOLN
INTRAMUSCULAR | Status: DC | PRN
  Administered 2023-10-19: 4 mg via INTRAVENOUS

## 2023-10-19 MED ORDER — MORPHINE SULFATE (PF) 2 MG/ML IV SOLN: 1.0000 mg | INTRAVENOUS | Status: DC | PRN

## 2023-10-19 SURGICAL SUPPLY — 44 items
BAG COUNTER SPONGE SURGICOUNT (BAG) IMPLANT
BAG ZIPLOCK 12X15 (MISCELLANEOUS) IMPLANT
BANDAGE ESMARK 6X9 LF (GAUZE/BANDAGES/DRESSINGS) ×1 IMPLANT
BIT DRILL 2.4X128 (BIT) ×1 IMPLANT
BNDG ELASTIC 6INX 5YD STR LF (GAUZE/BANDAGES/DRESSINGS) ×1 IMPLANT
BNDG ESMARK 6X9 LF (GAUZE/BANDAGES/DRESSINGS) ×1 IMPLANT
COVER SURGICAL LIGHT HANDLE (MISCELLANEOUS) ×1 IMPLANT
CUFF TRNQT CYL 34X4.125X (TOURNIQUET CUFF) ×1 IMPLANT
DRAPE INCISE IOBAN 66X45 STRL (DRAPES) IMPLANT
DRAPE POUCH INSTRU U-SHP 10X18 (DRAPES) ×1 IMPLANT
DRAPE U-SHAPE 47X51 STRL (DRAPES) ×1 IMPLANT
DRSG ADAPTIC 3X8 NADH LF (GAUZE/BANDAGES/DRESSINGS) ×1 IMPLANT
DURAPREP 26ML APPLICATOR (WOUND CARE) ×1 IMPLANT
ELECT REM PT RETURN 15FT ADLT (MISCELLANEOUS) ×1 IMPLANT
GAUZE PAD ABD 8X10 STRL (GAUZE/BANDAGES/DRESSINGS) ×1 IMPLANT
GAUZE SPONGE 4X4 12PLY STRL (GAUZE/BANDAGES/DRESSINGS) ×1 IMPLANT
GLOVE BIO SURGEON STRL SZ 6.5 (GLOVE) ×2 IMPLANT
GLOVE BIO SURGEON STRL SZ7.5 (GLOVE) ×1 IMPLANT
GLOVE BIO SURGEON STRL SZ8 (GLOVE) ×1 IMPLANT
GLOVE BIOGEL PI IND STRL 6.5 (GLOVE) ×1 IMPLANT
GLOVE BIOGEL PI IND STRL 7.0 (GLOVE) ×1 IMPLANT
GLOVE BIOGEL PI IND STRL 8 (GLOVE) ×1 IMPLANT
GOWN SPEC L4 XLG W/TWL (GOWN DISPOSABLE) ×1 IMPLANT
GOWN STRL REUS W/ TWL LRG LVL3 (GOWN DISPOSABLE) ×3 IMPLANT
IMMOBILIZER KNEE 20 (SOFTGOODS) ×1 IMPLANT
IMMOBILIZER KNEE 20 THIGH 36 (SOFTGOODS) ×1 IMPLANT
KIT TURNOVER KIT A (KITS) IMPLANT
MANIFOLD NEPTUNE II (INSTRUMENTS) ×1 IMPLANT
NDL MA TROC 1/2 (NEEDLE) ×1 IMPLANT
NDL MAYO CATGUT SZ4 TPR NDL (NEEDLE) IMPLANT
NEEDLE MA TROC 1/2 (NEEDLE) ×1 IMPLANT
NEEDLE MAYO CATGUT SZ4 (NEEDLE) IMPLANT
PACK TOTAL JOINT (CUSTOM PROCEDURE TRAY) ×1 IMPLANT
PADDING CAST COTTON 6X4 STRL (CAST SUPPLIES) ×2 IMPLANT
PASSER SUT SWANSON 36MM LOOP (INSTRUMENTS) ×1 IMPLANT
PROTECTOR NERVE ULNAR (MISCELLANEOUS) ×1 IMPLANT
STRIP CLOSURE SKIN 1/2X4 (GAUZE/BANDAGES/DRESSINGS) ×2 IMPLANT
SUT ETHIBOND NAB CT1 #1 30IN (SUTURE) ×2 IMPLANT
SUT FIBERWIRE #2 38 T-5 BLUE (SUTURE) ×3 IMPLANT
SUT MNCRL AB 4-0 PS2 18 (SUTURE) ×1 IMPLANT
SUT VIC AB 1 CT1 27XBRD ANTBC (SUTURE) IMPLANT
SUT VIC AB 2-0 CT1 TAPERPNT 27 (SUTURE) ×2 IMPLANT
SUTURE FIBERWR #2 38 T-5 BLUE (SUTURE) ×2 IMPLANT
TOWEL OR 17X26 10 PK STRL BLUE (TOWEL DISPOSABLE) ×1 IMPLANT

## 2023-10-19 NOTE — Plan of Care (Signed)
   Problem: Coping: Goal: Level of anxiety will decrease Outcome: Progressing   Problem: Pain Managment: Goal: General experience of comfort will improve and/or be controlled Outcome: Progressing   Problem: Safety: Goal: Ability to remain free from injury will improve Outcome: Progressing

## 2023-10-19 NOTE — Anesthesia Procedure Notes (Signed)
 Procedure Name: LMA Insertion Date/Time: 10/19/2023 4:10 PM  Performed by: Rochell Chroman, CRNAPre-anesthesia Checklist: Emergency Drugs available, Patient identified, Suction available and Patient being monitored Patient Re-evaluated:Patient Re-evaluated prior to induction Oxygen Delivery Method: Circle system utilized Preoxygenation: Pre-oxygenation with 100% oxygen Induction Type: IV induction Ventilation: Mask ventilation without difficulty LMA: LMA inserted LMA Size: 4.0 Number of attempts: 1 Placement Confirmation: positive ETCO2 and breath sounds checked- equal and bilateral Tube secured with: Tape Dental Injury: Teeth and Oropharynx as per pre-operative assessment

## 2023-10-19 NOTE — Progress Notes (Signed)
 Attempted report, nurse was not available at this time.

## 2023-10-19 NOTE — Anesthesia Preprocedure Evaluation (Addendum)
 Anesthesia Evaluation  Patient identified by MRN, date of birth, ID band Patient awake    Reviewed: Allergy & Precautions, NPO status , Patient's Chart, lab work & pertinent test results  History of Anesthesia Complications Negative for: history of anesthetic complications  Airway Mallampati: II  TM Distance: >3 FB Neck ROM: Full    Dental  (+) Dental Advisory Given, Missing   Pulmonary neg pulmonary ROS   Pulmonary exam normal        Cardiovascular negative cardio ROS Normal cardiovascular exam     Neuro/Psych  PSYCHIATRIC DISORDERS Anxiety     negative neurological ROS     GI/Hepatic Neg liver ROS,GERD  Medicated and Controlled,,  Endo/Other   Obesity   Renal/GU negative Renal ROS     Musculoskeletal  (+) Arthritis ,    Abdominal   Peds  Hematology negative hematology ROS (+)   Anesthesia Other Findings   Reproductive/Obstetrics                             Anesthesia Physical Anesthesia Plan  ASA: 2  Anesthesia Plan: General   Post-op Pain Management: Tylenol  PO (pre-op)* and Regional block*   Induction: Intravenous  PONV Risk Score and Plan: 3 and Treatment may vary due to age or medical condition, Ondansetron  and TIVA  Airway Management Planned: LMA  Additional Equipment: None  Intra-op Plan:   Post-operative Plan: Extubation in OR  Informed Consent: I have reviewed the patients History and Physical, chart, labs and discussed the procedure including the risks, benefits and alternatives for the proposed anesthesia with the patient or authorized representative who has indicated his/her understanding and acceptance.     Dental advisory given  Plan Discussed with: CRNA and Anesthesiologist  Anesthesia Plan Comments:         Anesthesia Quick Evaluation

## 2023-10-19 NOTE — Anesthesia Procedure Notes (Addendum)
 Anesthesia Regional Block: Femoral nerve block   Pre-Anesthetic Checklist: , timeout performed,  Correct Patient, Correct Site, Correct Laterality,  Correct Procedure, Correct Position, site marked,  Risks and benefits discussed,  Surgical consent,  Pre-op evaluation,  At surgeon's request and post-op pain management  Laterality: Left  Prep: chloraprep       Needles:  Injection technique: Single-shot  Needle Type: Echogenic Needle     Needle Length: 10cm  Needle Gauge: 21     Additional Needles:   Narrative:  Start time: 10/19/2023 3:41 PM End time: 10/19/2023 3:44 PM Injection made incrementally with aspirations every 5 mL.  Performed by: Personally  Anesthesiologist: Juventino Oppenheim, MD  Additional Notes: No pain on injection. No increased resistance to injection. Injection made in 5cc increments. Good needle visualization. Patient tolerated the procedure well.

## 2023-10-19 NOTE — H&P (Signed)
 ADMISSION H&P  Subjective:  HPI: Crystal Little, 83 y.o. female is s/p left total knee arthroplasty from July 2024. She did very well with the knee initially and recovered well postoperatively. At four months postop, she presented to our clinic due to new onset anterior knee pain. 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 At that time, the extensor mechanism was intact so no surgical intervention was necessary. She was initially placed in a knee immobilizer, then upgraded to an economy hinge brace based on improvement. In December, she was evaluated again and found to have further migration of the superior patella with loss of extensor mechanism. She presents today for surgical repair.   Patient Active Problem List   Diagnosis Date Noted   Nocturnal leg cramps 06/20/2023   Primary osteoarthritis of left knee 04/18/2023   Leg swelling 11/09/2021   Anemia 10/24/2018   Sciatica, right side 05/17/2017   Special screening for malignant neoplasms, colon 04/26/2011   Diverticulosis of colon 04/26/2011   Dysphagia, pharyngoesophageal phase 01/08/2010   Enthesopathy of ankle and tarsus 12/24/2008   PLANTAR FASCIITIS, LEFT 12/24/2008   OA (osteoarthritis) of knee 05/09/2007   DEPENDENT EDEMA 05/09/2007   Personal history presenting hazards to health 05/09/2007   GERD 05/02/2007    Past Medical History:  Diagnosis Date   Anemia    Anxiety    Diverticulosis    Frequent UTI    sees Dr. Nolon Baxter   GERD (gastroesophageal reflux disease)    Gynecological examination    sees Dr. Maxcine Spalding   IBS (irritable bowel syndrome)    Leg edema    Osteoarthritis    Status post dilation of esophageal narrowing    Varicose veins of legs    sees [facility]     Past Surgical History:  Procedure Laterality Date   BACK SURGERY  10/2021   lumbar 4-5   COLONOSCOPY  04/26/2011   per Dr. Adan Holms, diverticulosis only, no  repeats needed   endovascular repair of bilateral leg varicosities     per Dr. Enedina Harrow    ESOPHAGOGASTRODUODENOSCOPY  01/19/2010   per Dr. Adan Holms, with dilatation    ESOPHAGOGASTRODUODENOSCOPY (EGD) WITH ESOPHAGEAL DILATION     right knee arthroscopy  12/17/05 and 01-06-12   per Dr. Abigail Abler   TONSILLECTOMY     TOTAL KNEE ARTHROPLASTY Left 04/18/2023   Procedure: TOTAL KNEE ARTHROPLASTY;  Surgeon: Liliane Rei, MD;  Location: [facility] ORS;  Service: Orthopedics;  Laterality: Left;    Prior to Admission medications   Medication Sig Start Date End Date Taking? Authorizing Provider  ALPRAZolam  (XANAX ) 0.25 MG tablet Take 1 tablet (0.25 mg total) by mouth at bedtime as needed for anxiety. Patient taking differently: Take 0.25 mg by mouth at bedtime. 06/20/23  Yes Donley Furth, MD  ascorbic acid (VITAMIN C) 500 MG tablet Take 500 mg by mouth daily.   Yes [provider]  Calcium-Vitamin D -Vitamin K (VIACTIV CALCIUM PLUS D PO) Take 1 tablet by mouth daily.   Yes [provider]  Carboxymethylcellulose Sodium (THERATEARS OP) Place 1 drop into both eyes daily as needed (dry eyes).   Yes [provider]  cholecalciferol (VITAMIN D3) 25 MCG (1000 UNIT) tablet Take 1,000 Units by mouth daily.   Yes [provider]  Hydrocortisone 2 % CREA Apply 1 Application topically daily as needed (leg itching).   Yes [provider]  Iron , Ferrous Sulfate , 325 (65  Fe) MG TABS Take 325 mg by mouth daily. 06/23/23  Yes Donley Furth, MD  methocarbamol  (ROBAXIN ) 500 MG tablet Take 500 mg by mouth 3 (three) times daily as needed for muscle spasms. 09/06/23  Yes [provider]  omeprazole  (PRILOSEC) 40 MG capsule Take 1 capsule (40 mg total) by mouth daily. 06/20/23  Yes Donley Furth, MD  torsemide  (DEMADEX ) 20 MG tablet TAKE 2 TABLETS BY MOUTH TWICE A DAY Patient taking differently: Take 40 mg by mouth daily. 04/21/23  Yes Donley Furth, MD  vitamin E 180 MG (400  UNITS) capsule Take 400 Units by mouth daily.   Yes [provider]  phentermine  37.5 MG capsule Take 1 capsule (37.5 mg total) by mouth every morning. Patient not taking: Reported on 10/10/2023 02/17/23   Donley Furth, MD    Allergies  Allergen Reactions   Amoxicillin      Unknown reaction     Social History   Socioeconomic History   Marital status: Single    Spouse name: Not on file   Number of children: 3   Years of education: Not on file   Highest education level: Not on file  Occupational History   Occupation: [employer] pilot  Tobacco Use   Smoking status: Never   Smokeless tobacco: Never  Vaping Use   Vaping status: Never Used  Substance and Sexual Activity   Alcohol use: Not Currently    Alcohol/week: 2.0 standard drinks of alcohol    Types: 2 Glasses of wine per week    Comment: occ   Drug use: No   Sexual activity: Not Currently  Other Topics Concern   Not on file  Social History Narrative   Not on file   Social Drivers of Health   Financial Resource Strain: Low Risk  (06/13/2023)   Overall Financial Resource Strain (CARDIA)    Difficulty of Paying Living Expenses: Not hard at all  Food Insecurity: No Food Insecurity (06/13/2023)   Hunger Vital Sign    Worried About Running Out of Food in the Last Year: Never true    Ran Out of Food in the Last Year: Never true  Transportation Needs: No Transportation Needs (06/13/2023)   PRAPARE - Administrator, Civil Service (Medical): No    Lack of Transportation (Non-Medical): No  Physical Activity: Insufficiently Active (06/13/2023)   Exercise Vital Sign    Days of Exercise per Week: 2 days    Minutes of Exercise per Session: 40 min  Stress: No Stress Concern Present (06/13/2023)   [employer] of Occupational Health - Occupational Stress Questionnaire    Feeling of Stress : Not at all  Social Connections: Moderately Integrated (06/13/2023)   Social Connection and Isolation Panel [NHANES]     Frequency of Communication with Friends and Family: More than three times a week    Frequency of Social Gatherings with Friends and Family: More than three times a week    Attends Religious Services: More than 4 times per year    Active Member of [employer] or Organizations: Yes    Attends Banker Meetings: More than 4 times per year    Marital Status: Widowed  Intimate Partner Violence: Not At Risk (06/13/2023)   Humiliation, Afraid, Rape, and Kick questionnaire    Fear of Current or Ex-Partner: No    Emotionally Abused: No    Physically Abused: No    Sexually Abused: No    Tobacco Use: Low Risk  (  10/13/2023)   Patient History    Smoking Tobacco Use: Never    Smokeless Tobacco Use: Never    Passive Exposure: Not on file   Social History   Substance and Sexual Activity  Alcohol Use Not Currently   Alcohol/week: 2.0 standard drinks of alcohol   Types: 2 Glasses of wine per week   Comment: occ    Family History  Problem Relation Age of Onset   Irritable bowel syndrome Mother    Pancreatic cancer Paternal Grandmother    Diabetes Maternal Aunt    Diabetes Maternal Uncle    Heart disease Paternal Grandfather    Colon cancer Neg Hx    Colon polyps Neg Hx    Kidney disease Neg Hx     Review of Systems  Constitutional:  Negative for chills and fever.  HENT:  Negative for congestion, sore throat and tinnitus.   Eyes:  Negative for double vision, photophobia and pain.  Respiratory:  Negative for cough, shortness of breath and wheezing.   Cardiovascular:  Negative for chest pain, palpitations and orthopnea.  Gastrointestinal:  Negative for heartburn, nausea and vomiting.  Genitourinary:  Negative for dysuria, frequency and urgency.  Musculoskeletal:  Positive for joint pain.  Neurological:  Negative for dizziness, weakness and headaches.    Objective:  Physical Exam: - Well-developed female alert and oriented in no apparent distress  - Left knee shows no  effusion.  - Range of motion actively is approximately 10-110 degrees, passively 5-110 degrees.  - No instability noted.   Imaging Review AP and lateral XR of the left knee showed the femoral and tibial components of the prosthesis in excellent position. Lateral view shows an avulsion fracture of the patella with migration of the superior pole. This worsened when repeat films were repeated a few months later.   Assessment/Plan:  Left knee pain.   Unfortunately, there was worsening proximal migration of the patella, and the patient lost active extension following the avulsion fracture. With the increased pain, the migration of the patella, and the diminished active extension, surgical intervention to repair the tendon is necessary. This was discussed in detail with the patient and she elects to proceed. She will be placed in a knee immobilizer postoperatively to allow adequate soft tissue healing for at least 4-6 weeks.   Sharlynn Dear, PA-C Orthopedic Surgery [facility] [place]

## 2023-10-19 NOTE — Discharge Instructions (Signed)
 Crystal Rei, MD Total Joint Specialist [facility] [address], [address] [place], [place] [address] [phone]  TOTAL KNEE REPLACEMENT POSTOPERATIVE DIRECTIONS  Knee Rehabilitation, Guidelines Following Surgery  Results after knee surgery are often greatly improved when you follow the exercise, range of motion and muscle strengthening exercises prescribed by your doctor. Safety measures are also important to protect the knee from further injury. If any of these exercises cause you to have increased pain or swelling in your knee joint, decrease the amount until you are comfortable again and slowly increase them. If you have problems or questions, call your caregiver or physical therapist for advice.   *Maintain knee immobilizer at all times  HOME CARE INSTRUCTIONS  Remove items at home which could result in a fall. This includes throw rugs or furniture in walking pathways.  ICE to the affected knee as much as tolerated. Icing helps control swelling. If the swelling is well controlled you will be more comfortable and rehab easier. Continue to use ice on the knee for pain and swelling from surgery. You may notice swelling that will progress down to the foot and ankle. This is normal after surgery. Elevate the leg when you are not up walking on it.    Continue to use the breathing machine which will help keep your temperature down. It is common for your temperature to cycle up and down following surgery, especially at night when you are not up moving around and exerting yourself. The breathing machine keeps your lungs expanded and your temperature down. Do not place pillow under the operative knee, focus on keeping the knee straight while resting  DIET You may resume your previous home diet once you are discharged from the hospital.  DRESSING / WOUND CARE / SHOWERING Keep your bulky bandage on for 2 days. On the third post-operative day you may remove the Ace bandage  and gauze. There is a waterproof adhesive bandage on your skin which will stay in place until your first follow-up appointment. Once you remove this you will not need to place another bandage You may begin showering 3 days following surgery, but do not submerge the incision under water .  ACTIVITY For the first 5 days, the key is rest and control of pain and swelling Do your home exercises twice a day starting on post-operative day 3. On the days you go to physical therapy, just do the home exercises once that day. You should rest, ice and elevate the leg for 50 minutes out of every hour. Get up and walk/stretch for 10 minutes per hour. After 5 days you can increase your activity slowly as tolerated. Walk with your walker as instructed. Use the walker until you are comfortable transitioning to a cane. Walk with the cane in the opposite hand of the operative leg. You may discontinue the cane once you are comfortable and walking steadily. Avoid periods of inactivity such as sitting longer than an hour when not asleep. This helps prevent blood clots.  You may resume a sexual relationship in one month or when given the OK by your doctor.  You may return to work once you are cleared by your doctor.  Do not drive a car for 6 weeks or until released by your surgeon.  Do not drive while taking narcotics.  TED HOSE STOCKINGS Wear the elastic stockings on both legs for three weeks following surgery during the day. You may remove them at night for sleeping.  WEIGHT BEARING Weight bearing  as tolerated with assist device (walker, cane, etc) as directed, use it as long as suggested by your surgeon or therapist, typically at least 4-6 weeks.  POSTOPERATIVE CONSTIPATION PROTOCOL Constipation - defined medically as fewer than three stools per week and severe constipation as less than one stool per week.  One of the most common issues patients have following surgery is constipation.  Even if you have a regular  bowel pattern at home, your normal regimen is likely to be disrupted due to multiple reasons following surgery.  Combination of anesthesia, postoperative narcotics, change in appetite and fluid intake all can affect your bowels.  In order to avoid complications following surgery, here are some recommendations in order to help you during your recovery period.  Colace (docusate) - Pick up an over-the-counter form of Colace or another stool softener and take twice a day as long as you are requiring postoperative pain medications.  Take with a full glass of water  daily.  If you experience loose stools or diarrhea, hold the colace until you stool forms back up. If your symptoms do not get better within 1 week or if they get worse, check with your doctor. Dulcolax (bisacodyl ) - Pick up over-the-counter and take as directed by the product packaging as needed to assist with the movement of your bowels.  Take with a full glass of water .  Use this product as needed if not relieved by Colace only.  MiraLax  (polyethylene glycol) - Pick up over-the-counter to have on hand. MiraLax  is a solution that will increase the amount of water  in your bowels to assist with bowel movements.  Take as directed and can mix with a glass of water , juice, soda, coffee, or tea. Take if you go more than two days without a movement. Do not use MiraLax  more than once per day. Call your doctor if you are still constipated or irregular after using this medication for 7 days in a row.  If you continue to have problems with postoperative constipation, please contact the office for further assistance and recommendations.  If you experience "the worst abdominal pain ever" or develop nausea or vomiting, please contact the office immediatly for further recommendations for treatment.  ITCHING If you experience itching with your medications, try taking only a single pain pill, or even half a pain pill at a time.  You can also use Benadryl  over the  counter for itching or also to help with sleep.   MEDICATIONS See your medication summary on the "After Visit Summary" that the nursing staff will review with you prior to discharge.  You may have some home medications which will be placed on hold until you complete the course of blood thinner medication.  It is important for you to complete the blood thinner medication as prescribed by your surgeon.  Continue your approved medications as instructed at time of discharge.  PRECAUTIONS If you experience chest pain or shortness of breath - call 911 immediately for transfer to the hospital emergency department.  If you develop a fever greater that 101 F, purulent drainage from wound, increased redness or drainage from wound, foul odor from the wound/dressing, or calf pain - CONTACT YOUR SURGEON.                                                   FOLLOW-UP APPOINTMENTS Make  sure you keep all of your appointments after your operation with your surgeon and caregivers. You should call the office at the above phone number and make an appointment for approximately two weeks after the date of your surgery or on the date instructed by your surgeon outlined in the "After Visit Summary".  RANGE OF MOTION AND STRENGTHENING EXERCISES  Rehabilitation of the knee is important following a knee injury or an operation. After just a few days of immobilization, the muscles of the thigh which control the knee become weakened and shrink (atrophy). Knee exercises are designed to build up the tone and strength of the thigh muscles and to improve knee motion. Often times heat used for twenty to thirty minutes before working out will loosen up your tissues and help with improving the range of motion but do not use heat for the first two weeks following surgery. These exercises can be done on a training (exercise) mat, on the floor, on a table or on a bed. Use what ever works the best and is most comfortable for you Knee exercises  include:  Leg Lifts - While your knee is still immobilized in a splint or cast, you can do straight leg raises. Lift the leg to 60 degrees, hold for 3 sec, and slowly lower the leg. Repeat 10-20 times 2-3 times daily. Perform this exercise against resistance later as your knee gets better.  Quad and Hamstring Sets - Tighten up the muscle on the front of the thigh (Quad) and hold for 5-10 sec. Repeat this 10-20 times hourly. Hamstring sets are done by pushing the foot backward against an object and holding for 5-10 sec. Repeat as with quad sets.  A rehabilitation program following serious knee injuries can speed recovery and prevent re-injury in the future due to weakened muscles. Contact your doctor or a physical therapist for more information on knee rehabilitation.   POST-OPERATIVE OPIOID TAPER INSTRUCTIONS: It is important to wean off of your opioid medication as soon as possible. If you do not need pain medication after your surgery it is ok to stop day one. Opioids include: Codeine, Hydrocodone (Norco, Vicodin), Oxycodone (Percocet, oxycontin ) and hydromorphone  amongst others.  Long term and even short term use of opiods can cause: Increased pain response Dependence Constipation Depression Respiratory depression And more.  Withdrawal symptoms can include Flu like symptoms Nausea, vomiting And more Techniques to manage these symptoms Hydrate well Eat regular healthy meals Stay active Use relaxation techniques(deep breathing, meditating, yoga) Do Not substitute Alcohol to help with tapering If you have been on opioids for less than two weeks and do not have pain than it is ok to stop all together.  Plan to wean off of opioids This plan should start within one week post op of your joint replacement. Maintain the same interval or time between taking each dose and first decrease the dose.  Cut the total daily intake of opioids by one tablet each day Next start to increase the time  between doses. The last dose that should be eliminated is the evening dose.   IF YOU ARE TRANSFERRED TO A SKILLED REHAB FACILITY If the patient is transferred to a skilled rehab facility following release from the hospital, a list of the current medications will be sent to the facility for the patient to continue.  When discharged from the skilled rehab facility, please have the facility set up the patient's Home Health Physical Therapy prior to being released. Also, the skilled facility will be responsible for  providing the patient with their medications at time of release from the facility to include their pain medication, the muscle relaxants, and their blood thinner medication. If the patient is still at the rehab facility at time of the two week follow up appointment, the skilled rehab facility will also need to assist the patient in arranging follow up appointment in our office and any transportation needs.  MAKE SURE YOU:  Understand these instructions.  Get help right away if you are not doing well or get worse.   DENTAL ANTIBIOTICS:  In most cases prophylactic antibiotics for Dental procdeures after total joint surgery are not necessary.  Exceptions are as follows:  1. History of prior total joint infection  2. Severely immunocompromised (Organ Transplant, cancer chemotherapy, Rheumatoid biologic medications such as Humera)  3. Poorly controlled diabetes (A1C &gt; 8.0, blood glucose over 200)  If you have one of these conditions, contact your surgeon for an antibiotic prescription, prior to your dental procedure.    Pick up stool softner and laxative for home use following surgery while on pain medications. Do not submerge incision under water . Please use good hand washing techniques while changing dressing each day. May shower starting three days after surgery. Please use a clean towel to pat the incision dry following showers. Continue to use ice for pain and swelling after  surgery. Do not use any lotions or creams on the incision until instructed by your surgeon.

## 2023-10-19 NOTE — Transfer of Care (Signed)
 Immediate Anesthesia Transfer of Care Note  Patient: BRADYN COLESTOCK  Procedure(s) Performed: PATELLAR TENDON REPAIR (Left: Knee)  Patient Location: PACU  Anesthesia Type:General  Level of Consciousness: awake and patient cooperative  Airway & Oxygen Therapy: Patient Spontanous Breathing and Patient connected to face mask  Post-op Assessment: Report given to RN and Post -op Vital signs reviewed and stable  Post vital signs: Reviewed and stable  Last Vitals:  Vitals Value Taken Time  BP 133/81 10/19/23 1806  Temp    Pulse 80 10/19/23 1807  Resp 19 10/19/23 1807  SpO2 97 % 10/19/23 1807  Vitals shown include unfiled device data.  Last Pain:  Vitals:   10/19/23 1433  TempSrc: Oral  PainSc:          Complications: No notable events documented.

## 2023-10-19 NOTE — Anesthesia Postprocedure Evaluation (Signed)
 Anesthesia Post Note  Patient: Crystal Little  Procedure(s) Performed: PATELLAR TENDON REPAIR (Left: Knee)     Patient location during evaluation: PACU Anesthesia Type: General Level of consciousness: awake and alert Pain management: pain level controlled Vital Signs Assessment: post-procedure vital signs reviewed and stable Respiratory status: spontaneous breathing, nonlabored ventilation and respiratory function stable Cardiovascular status: blood pressure returned to baseline and stable Postop Assessment: no apparent nausea or vomiting Anesthetic complications: no   No notable events documented.  Last Vitals:  Vitals:   10/19/23 1815 10/19/23 1830  BP: (!) 152/84 (!) 145/65  Pulse: 75 71  Resp: 17 12  Temp:    SpO2: 93% 96%    Last Pain:  Vitals:   10/19/23 1830  TempSrc:   PainSc: Asleep                 Erin Havers

## 2023-10-19 NOTE — Op Note (Signed)
NAME: Crystal Little, NONDORF MEDICAL RECORD NO: [id number] ACCOUNT NO: [id number] DATE OF BIRTH: August 07, 1941 FACILITY: [facility] LOCATION: [facility] PHYSICIAN: Gus Rankin. Lavi Sheehan, MD  Operative Report   DATE OF PROCEDURE: 10/19/2023  PREOPERATIVE DIAGNOSIS:  Left patellar tendon rupture.  POSTOPERATIVE DIAGNOSIS:  Left patellar tendon rupture.  PROCEDURE PERFORMED:  Left patellar tendon repair.  SURGEON:  Gus Rankin.  Areej Tayler, MD  ASSISTANT:  Jeanett Schlein, PA-C.  ANESTHESIA:  General and adductor canal block.  ESTIMATED BLOOD LOSS:  Minimal.  DRAINS:  None.  TOURNIQUET TIME:  54 minutes at 300 mmHg.  COMPLICATIONS:  None.  CONDITION:  Stable recovery.  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  She was doing very well and then had an episode where she developed anterior knee pain.  She had a small fragment that avulsed off  the inferior pole of the patella.  Initially, it did not appear that there was any issue with the extensor mechanism, but then upon presenting to the office a week and a half ago, she could not fully extend the knee and the patella had migrated  superiorly consistent with the tendon rupturing off the inferior pole of the patella.  She presents now for patellar tendon repair.  DESCRIPTION OF PROCEDURE:  After successful administration of adductor canal block and general anesthetic, the tourniquet was placed high on the left thigh and the left lower extremity was prepped and draped in the usual sterile fashion.  Extremities  were wrapped in Esmarch and tourniquet inflated to 300 mmHg.  Midline incision was made with a #10 blade through the subcutaneous tissue to the extensor mechanism.  The superficial retinaculum was incised and the tendon identified.  There is a soft spot  between the patellar tendon and the inferior pole of the patella consistent with where it had ruptured off.  This was  filled with some scar tissue.  I excised the scar tissue to show the free edges of the tendon and the inferior pole of the patella.   There was a tiny bone fragment in there that I excised.  I then drilled three holes in the patella vertically.  We then passed a #2 FiberWire suture starting around the tibial tubercle and coursing up the patellar tendon, one on the medial half and the  other on the lateral half.  We used a Administrator, sports through the tendon.  The sutures were then passed through the drill holes and then the patella advanced back to its more appropriate position and the sutures tied to maintain the repair and keep the  patella in its normal position.  This was felt to be a very stable repair.  Flexion against gravity was 60 degrees and there was no gapping at all in the repair.  I then oversewed the repair with the FiberWire suture.  The retinacular layer was then  closed with interrupted #1 Ethibond sutures.  Overall, the repair was extremely stable.  Tourniquet was then released for a total tourniquet time of 54 minutes.  The subcutaneous was then thoroughly irrigated with saline.  Subcutaneous tissue was closed  with interrupted 2-0 Vicryl, subcuticular running 4-0 Monocryl.  Incision was cleaned and dried and Dermabond was applied to the skin.  She was then placed into a knee immobilizer, awakened and transported to recovery in stable condition.   PUS D: 10/19/2023 5:38:15 pm T: 10/19/2023 7:21:00 pm  JOB: [id number]/ [id number]

## 2023-10-19 NOTE — Brief Op Note (Signed)
 10/19/2023  5:33 PM  PATIENT:  Crystal Little  83 y.o. female  PRE-OPERATIVE DIAGNOSIS:  Left patellar tendon tear  POST-OPERATIVE DIAGNOSIS:  left patellar tendon repair  PROCEDURE:  Procedure(s): PATELLAR TENDON REPAIR (Left)  SURGEON:  Surgeons and Role:    Liliane Rei, MD - Primary  PHYSICIAN ASSISTANT:   ASSISTANTS: Sharlynn Dear, PA-C   ANESTHESIA:   regional and general  EBL:  minimal  BLOOD ADMINISTERED:none  DRAINS: none   LOCAL MEDICATIONS USED:  NONE  COUNTS:  YES  TOURNIQUET:  * Missing tourniquet times found for documented tourniquets in log: [id number] *  DICTATION: .Other Dictation: Dictation Number [id number]  PLAN OF CARE: Admit for overnight observation  PATIENT DISPOSITION:  PACU - hemodynamically stable.

## 2023-10-20 ENCOUNTER — Encounter (HOSPITAL_COMMUNITY): Payer: Self-pay | Admitting: Orthopedic Surgery

## 2023-10-20 DIAGNOSIS — Z96652 Presence of left artificial knee joint: Secondary | ICD-10-CM | POA: Diagnosis not present

## 2023-10-20 DIAGNOSIS — S86812A Strain of other muscle(s) and tendon(s) at lower leg level, left leg, initial encounter: Secondary | ICD-10-CM | POA: Diagnosis not present

## 2023-10-20 LAB — BASIC METABOLIC PANEL
Anion gap: 8 (ref 5–15)
BUN: 17 mg/dL (ref 8–23)
CO2: 26 mmol/L (ref 22–32)
Calcium: 8.5 mg/dL — ABNORMAL LOW (ref 8.9–10.3)
Chloride: 99 mmol/L (ref 98–111)
Creatinine, Ser: 0.8 mg/dL (ref 0.44–1.00)
GFR, Estimated: >60 mL/min (ref >60)
Glucose, Bld: 139 mg/dL — ABNORMAL HIGH (ref 70–99)
Potassium: 3.7 mmol/L (ref 3.5–5.1)
Sodium: 133 mmol/L — ABNORMAL LOW (ref 135–145)

## 2023-10-20 LAB — CBC
HCT: 33.2 % — ABNORMAL LOW (ref 36.0–46.0)
Hemoglobin: 10.4 g/dL — ABNORMAL LOW (ref 12.0–15.0)
MCH: 29 pg (ref 26.0–34.0)
MCHC: 31.3 g/dL (ref 30.0–36.0)
MCV: 92.5 fL (ref 80.0–100.0)
Platelets: 234 10*3/uL (ref 150–400)
RBC: 3.59 MIL/uL — ABNORMAL LOW (ref 3.87–5.11)
RDW: 14.8 % (ref 11.5–15.5)
WBC: 9.2 10*3/uL (ref 4.0–10.5)
nRBC: 0 % (ref 0.0–0.2)

## 2023-10-20 MED ORDER — TRAMADOL HCL 50 MG PO TABS: 50.0000 mg | ORAL_TABLET | Freq: Four times a day (QID) | ORAL | 0 refills | Status: DC | PRN

## 2023-10-20 MED ORDER — ASPIRIN 81 MG PO CHEW: 81.0000 mg | CHEWABLE_TABLET | Freq: Two times a day (BID) | ORAL | 0 refills | Status: AC

## 2023-10-20 MED ORDER — OXYCODONE HCL 5 MG PO TABS: 5.0000 mg | ORAL_TABLET | Freq: Four times a day (QID) | ORAL | 0 refills | Status: DC | PRN

## 2023-10-20 MED ORDER — METHOCARBAMOL 500 MG PO TABS: 500.0000 mg | ORAL_TABLET | Freq: Four times a day (QID) | ORAL | 0 refills | Status: DC | PRN

## 2023-10-20 MED ORDER — ONDANSETRON HCL 4 MG PO TABS: 4.0000 mg | ORAL_TABLET | Freq: Four times a day (QID) | ORAL | 0 refills | Status: DC | PRN

## 2023-10-20 NOTE — Progress Notes (Signed)
   Subjective: 1 Day Post-Op Procedure(s) (LRB): PATELLAR TENDON REPAIR (Left) Patient reports pain as mild.   Patient seen in rounds by Dr. Lequita Halt. Patient is well, and has had no acute complaints or problems Plan is to go Home after hospital stay.  Objective: Vital signs in last 24 hours: Temp:  [97.7 F (36.5 C)-98.2 F (36.8 C)] 98.2 F (36.8 C) (01/16 0537) Pulse Rate:  [71-96] 72 (01/16 0537) Resp:  [12-19] 17 (01/16 0537) BP: (119-163)/(50-89) 121/50 (01/16 0537) SpO2:  [93 %-100 %] 96 % (01/16 0537) Weight:  [94 kg] 94 kg (01/15 1427)  Intake/Output from previous day:  Intake/Output Summary (Last 24 hours) at 10/20/2023 0733 Last data filed at 10/20/2023 0600 Gross per 24 hour  Intake 2486.71 ml  Output 270 ml  Net 2216.71 ml    Intake/Output this shift: No intake/output data recorded.  Labs: Recent Labs    10/20/23 0345  HGB 10.4*   Recent Labs    10/20/23 0345  WBC 9.2  RBC 3.59*  HCT 33.2*  PLT 234   Recent Labs    10/20/23 0345  NA 133*  K 3.7  CL 99  CO2 26  BUN 17  CREATININE 0.80  GLUCOSE 139*  CALCIUM 8.5*   No results for input(s): "LABPT", "INR" in the last 72 hours.  Exam: General - Patient is Alert and Oriented Extremity - Neurologically intact Neurovascular intact Sensation intact distally Dorsiflexion/Plantar flexion intact Dressing/Incision - clean, dry, no drainage Motor Function - intact, moving foot and toes well on exam.   Past Medical History:  Diagnosis Date   Anemia    Anxiety    Diverticulosis    Frequent UTI    sees Dr. Earlene Plater   GERD (gastroesophageal reflux disease)    IBS (irritable bowel syndrome)    Leg edema    Osteoarthritis    Status post dilation of esophageal narrowing    Varicose veins of legs    sees [facility]     Assessment/Plan: 1 Day Post-Op Procedure(s) (LRB): PATELLAR TENDON REPAIR (Left) Principal Problem:   Peri-prosthetic patellar fracture Active Problems:   Patellar  tendon rupture, left, initial encounter  Estimated body mass index is 33.96 kg/m as calculated from the following:   Height as of this encounter: 5' 5.5" (1.664 m).   Weight as of this encounter: 94 kg. Up with therapy  DVT Prophylaxis - Aspirin Weight-bearing as tolerated No ROM to the LLE Maintain knee immobilizer at all times  Plan for discharge to home later today if therapy feels she is mobilizing well enough. Will likely begin HHPT when we allow her to start ROM work. Can always order if needed at discharge however.   Follow-up in our office in 2 weeks  The PDMP database was reviewed today prior to any opioid medications being prescribed to this patient.   Arther Abbott, PA-C Orthopedic Surgery [phone] 10/20/2023, 7:33 AM

## 2023-10-20 NOTE — Care Management Obs Status (Signed)
MEDICARE OBSERVATION STATUS NOTIFICATION   Patient Details  Name: Crystal Little MRN: [id number] Date of Birth: 1940-12-06   Medicare Observation Status Notification Given:       Howell Rucks, RN 10/20/2023, 9:45 AM

## 2023-10-20 NOTE — Evaluation (Signed)
Physical Therapy Evaluation Patient Details Name: Crystal Little MRN: [id number] DOB: November 02, 1940 Today's Date: 10/20/2023  History of Present Illness  Pt s/p L patellar tendon repair and with hx of back surgery and L TKR (24)  Clinical Impression  Pt admitted as above and presenting with functional mobility limitations 2* bil LE decreased strength/ROM (L knee in KI and R knee limited by significant arthritic changes), post op pain and decreased endurance and ambulatory balance deficits.  Pt hopes to progress to dc home with assist of family/friends and reports OP PT to start in 2 weeks.        If plan is discharge home, recommend the following: A lot of help with walking and/or transfers;A little help with bathing/dressing/bathroom;Assistance with cooking/housework;Assist for transportation;Help with stairs or ramp for entrance   Can travel by private vehicle        Equipment Recommendations None recommended by PT  Recommendations for Other Services       Functional Status Assessment Patient has had a recent decline in their functional status and demonstrates the ability to make significant improvements in function in a reasonable and predictable amount of time.     Precautions / Restrictions Precautions Precautions: Fall;Knee Precaution Comments: NO ROM L knee Required Braces or Orthoses: Knee Immobilizer - Left Knee Immobilizer - Left: On at all times Restrictions Weight Bearing Restrictions Per Provider Order: Yes LLE Weight Bearing Per Provider Order: Weight bearing as tolerated (with KI in place)      Mobility  Bed Mobility Overal bed mobility: Needs Assistance Bed Mobility: Supine to Sit     Supine to sit: Min assist     General bed mobility comments: increased time with min assist to manage L LE    Transfers Overall transfer level: Needs assistance Equipment used: Rolling walker (2 wheels) Transfers: Sit to/from Stand Sit to Stand: Min assist, Mod  assist, From elevated surface           General transfer comment: cues for LE management and use of UEs to self assist; physical assist to bring wt up and fwd and to balance in standing with RW    Ambulation/Gait Ambulation/Gait assistance: Min assist Gait Distance (Feet): 48 Feet Assistive device: Rolling walker (2 wheels) Gait Pattern/deviations: Step-to pattern, Decreased step length - right, Decreased step length - left, Shuffle, Trunk flexed Gait velocity: decr     General Gait Details: Increased time with cues for sequence, posture and position from [employer]            Wheelchair Mobility     Tilt Bed    Modified Rankin (Stroke Patients Only)       Balance Overall balance assessment: Needs assistance Sitting-balance support: Feet supported, No upper extremity supported Sitting balance-Leahy Scale: Good     Standing balance support: Bilateral upper extremity supported Standing balance-Leahy Scale: Poor                               Pertinent Vitals/Pain Pain Assessment Pain Assessment: 0-10 Pain Score: 3  Pain Location: L knee Pain Descriptors / Indicators: Aching, Sore    Home Living Family/patient expects to be discharged to:: Private residence Living Arrangements: Alone Available Help at Discharge: Family;Friend(s);Available 24 hours/day Type of Home: Other(Comment) (condo) Home Access: Stairs to enter Entrance Stairs-Rails: Right Entrance Stairs-Number of Steps: 2   Home Layout: One level Home Equipment: Agricultural consultant (2 wheels);Cane - single point  Prior Function Prior Level of Function : Independent/Modified Independent             Mobility Comments: using RW or cane prior to admit 2* knee instability       Extremity/Trunk Assessment   Upper Extremity Assessment Upper Extremity Assessment: Overall WFL for tasks assessed    Lower Extremity Assessment Lower Extremity Assessment: Generalized weakness;RLE  deficits/detail;LLE deficits/detail RLE Deficits / Details: noted arthritic changes at knee - pt states I need this knee done next LLE Deficits / Details: KI in place    Cervical / Trunk Assessment Cervical / Trunk Assessment: Kyphotic  Communication   Communication Communication: Hearing impairment  Cognition Arousal: Alert Behavior During Therapy: WFL for tasks assessed/performed Overall Cognitive Status: Within Functional Limits for tasks assessed                                          General Comments      Exercises General Exercises - Lower Extremity Ankle Circles/Pumps: AROM, Both, 15 reps, Supine   Assessment/Plan    PT Assessment Patient needs continued PT services  PT Problem List Decreased strength;Decreased range of motion;Decreased activity tolerance;Decreased balance;Decreased mobility;Decreased knowledge of use of DME;Pain       PT Treatment Interventions DME instruction;Gait training;Stair training;Functional mobility training;Therapeutic activities;Therapeutic exercise;Balance training;Patient/family education    PT Goals (Current goals can be found in the Care Plan section)  Acute Rehab PT Goals Patient Stated Goal: Regain IND PT Goal Formulation: With patient Time For Goal Achievement: 11/03/23 Potential to Achieve Goals: Good    Frequency 7X/week     Co-evaluation               AM-PAC PT "6 Clicks" Mobility  Outcome Measure Help needed turning from your back to your side while in a flat bed without using bedrails?: A Little Help needed moving from lying on your back to sitting on the side of a flat bed without using bedrails?: A Little Help needed moving to and from a bed to a chair (including a wheelchair)?: A Lot Help needed standing up from a chair using your arms (e.g., wheelchair or bedside chair)?: A Lot Help needed to walk in hospital room?: A Little Help needed climbing 3-5 steps with a railing? : Total 6 Click  Score: 14    End of Session Equipment Utilized During Treatment: Gait belt;Left knee immobilizer Activity Tolerance: Patient tolerated treatment well;Patient limited by fatigue Patient left: in chair;with call bell/phone within reach;with chair alarm set Nurse Communication: Mobility status PT Visit Diagnosis: Muscle weakness (generalized) (M62.81);Difficulty in walking, not elsewhere classified (R26.2);Unsteadiness on feet (R26.81)    Time: 1610-9604 PT Time Calculation (min) (ACUTE ONLY): 33 min   Charges:   PT Evaluation $PT Eval Low Complexity: 1 Low PT Treatments $Gait Training: 8-22 mins PT General Charges $$ ACUTE PT VISIT: 1 Visit         Mauro Kaufmann PT Acute Rehabilitation Services Pager [phone] Office [phone]   Delani Kohli 10/20/2023, 11:13 AM

## 2023-10-20 NOTE — Progress Notes (Signed)
Physical Therapy Treatment Patient Details Name: Crystal Little MRN: [id number] DOB: Aug 04, 1941 Today's Date: 10/20/2023   History of Present Illness Pt s/p L patellar tendon repair and with hx of back surgery and L TKR (24)    PT Comments  Pt continues very cooperative and with noted improvement in quality of gait and distance ambulated but requiring significant assist for sit to stand 2* KI on L LE and significant arthritic changes and weakness at R knee.    If plan is discharge home, recommend the following: A lot of help with walking and/or transfers;A little help with bathing/dressing/bathroom;Assistance with cooking/housework;Assist for transportation;Help with stairs or ramp for entrance   Can travel by private vehicle        Equipment Recommendations  None recommended by PT    Recommendations for Other Services       Precautions / Restrictions Precautions Precautions: Fall;Knee Precaution Comments: NO ROM L knee Required Braces or Orthoses: Knee Immobilizer - Left Knee Immobilizer - Left: On at all times Restrictions Weight Bearing Restrictions Per Provider Order: Yes LLE Weight Bearing Per Provider Order: Weight bearing as tolerated (with KI)     Mobility  Bed Mobility Overal bed mobility: Needs Assistance Bed Mobility: Sit to Supine     Supine to sit: Min assist Sit to supine: Min assist   General bed mobility comments: increased time with min assist to manage L LE    Transfers Overall transfer level: Needs assistance Equipment used: Rolling walker (2 wheels) Transfers: Sit to/from Stand Sit to Stand: Mod assist, +2 physical assistance           General transfer comment: cues for LE management and use of UEs to self assist; physical assist to bring wt up and fwd and to balance in standing with RW    Ambulation/Gait Ambulation/Gait assistance: Min assist Gait Distance (Feet): 88 Feet Assistive device: Rolling walker (2 wheels) Gait  Pattern/deviations: Step-to pattern, Decreased step length - right, Decreased step length - left, Shuffle, Trunk flexed Gait velocity: decr     General Gait Details: Increased time with cues for sequence, posture and position from [employer]             Wheelchair Mobility     Tilt Bed    Modified Rankin (Stroke Patients Only)       Balance Overall balance assessment: Needs assistance Sitting-balance support: Feet supported, No upper extremity supported Sitting balance-Leahy Scale: Good     Standing balance support: Single extremity supported Standing balance-Leahy Scale: Poor                              Cognition Arousal: Alert Behavior During Therapy: WFL for tasks assessed/performed Overall Cognitive Status: Within Functional Limits for tasks assessed                                          Exercises General Exercises - Lower Extremity Ankle Circles/Pumps: AROM, Both, 15 reps, Supine    General Comments        Pertinent Vitals/Pain Pain Assessment Pain Assessment: 0-10 Pain Score: 3  Pain Location: L knee Pain Descriptors / Indicators: Aching, Sore Pain Intervention(s): Limited activity within patient's tolerance, Monitored during session, Ice applied    Home Living Family/patient expects to be discharged to:: Private residence Living Arrangements: Alone Available  Help at Discharge: Family;Friend(s);Available 24 hours/day Type of Home: Other(Comment) (condo) Home Access: Stairs to enter Entrance Stairs-Rails: Right Entrance Stairs-Number of Steps: 2   Home Layout: One level Home Equipment: Agricultural consultant (2 wheels);Cane - single point      Prior Function            PT Goals (current goals can now be found in the care plan section) Acute Rehab PT Goals Patient Stated Goal: Regain IND PT Goal Formulation: With patient Time For Goal Achievement: 11/03/23 Potential to Achieve Goals: Good Progress towards  PT goals: Progressing toward goals    Frequency    7X/week      PT Plan      Co-evaluation              AM-PAC PT "6 Clicks" Mobility   Outcome Measure  Help needed turning from your back to your side while in a flat bed without using bedrails?: A Little Help needed moving from lying on your back to sitting on the side of a flat bed without using bedrails?: A Little Help needed moving to and from a bed to a chair (including a wheelchair)?: A Lot Help needed standing up from a chair using your arms (e.g., wheelchair or bedside chair)?: A Lot Help needed to walk in hospital room?: A Little Help needed climbing 3-5 steps with a railing? : Total 6 Click Score: 14    End of Session Equipment Utilized During Treatment: Gait belt;Left knee immobilizer Activity Tolerance: Patient tolerated treatment well;Patient limited by fatigue Patient left: in bed;with call bell/phone within reach;with bed alarm set Nurse Communication: Mobility status PT Visit Diagnosis: Muscle weakness (generalized) (M62.81);Difficulty in walking, not elsewhere classified (R26.2);Unsteadiness on feet (R26.81)     Time: 2956-2130 PT Time Calculation (min) (ACUTE ONLY): 25 min  Charges:    $Gait Training: 8-22 mins $Therapeutic Activity: 8-22 mins PT General Charges $$ ACUTE PT VISIT: 1 Visit                     Crystal Little PT Acute Rehabilitation Services Pager [phone] Office [phone]   Crystal Little 10/20/2023, 2:35 PM

## 2023-10-20 NOTE — TOC Transition Note (Signed)
Transition of Care [facility]) - Discharge Note   Patient Details  Name: Crystal Little MRN: [id number] Date of Birth: 08/26/1941  Transition of Care [facility]) CM/SW Contact:  Howell Rucks, RN Phone Number: 10/20/2023, 10:32 AM   Clinical Narrative:  Met with pt at bedside to review dc follow up therapy and home DME, confirmed knee immobilizer for 4-6 weeks, no other home DME needs at this time. Therapy follow up pending PT eval. TOC will continue to follow.      Final next level of care: Home/Self Care Barriers to Discharge: No Barriers Identified   Patient Goals and CMS Choice Patient states their goals for this hospitalization and ongoing recovery are:: return home          Discharge Placement                       Discharge Plan and Services Additional resources added to the After Visit Summary for                                       Social Drivers of Health (SDOH) Interventions SDOH Screenings   Food Insecurity: No Food Insecurity (10/19/2023)  Housing: Low Risk  (10/19/2023)  Transportation Needs: No Transportation Needs (10/19/2023)  Utilities: Not At Risk (10/19/2023)  Alcohol Screen: Low Risk  (06/13/2023)  Depression (PHQ2-9): Low Risk  (06/20/2023)  Financial Resource Strain: Low Risk  (06/13/2023)  Physical Activity: Insufficiently Active (06/13/2023)  Social Connections: Moderately Integrated (10/19/2023)  Stress: No Stress Concern Present (06/13/2023)  Tobacco Use: Low Risk  (10/19/2023)  Health Literacy: Adequate Health Literacy (06/13/2023)     Readmission Risk Interventions     No data to display

## 2023-10-20 NOTE — Plan of Care (Signed)
  Problem: Education: Goal: Knowledge of General Education information will improve Description Including pain rating scale, medication(s)/side effects and non-pharmacologic comfort measures Outcome: Progressing   Problem: Health Behavior/Discharge Planning: Goal: Ability to manage health-related needs will improve Outcome: Progressing   

## 2023-10-21 DIAGNOSIS — Z96652 Presence of left artificial knee joint: Secondary | ICD-10-CM | POA: Diagnosis not present

## 2023-10-21 DIAGNOSIS — S86812A Strain of other muscle(s) and tendon(s) at lower leg level, left leg, initial encounter: Secondary | ICD-10-CM | POA: Diagnosis not present

## 2023-10-21 LAB — CBC
HCT: 31.3 % — ABNORMAL LOW (ref 36.0–46.0)
Hemoglobin: 9.6 g/dL — ABNORMAL LOW (ref 12.0–15.0)
MCH: 28.8 pg (ref 26.0–34.0)
MCHC: 30.7 g/dL (ref 30.0–36.0)
MCV: 94 fL (ref 80.0–100.0)
Platelets: 264 10*3/uL (ref 150–400)
RBC: 3.33 MIL/uL — ABNORMAL LOW (ref 3.87–5.11)
RDW: 14.9 % (ref 11.5–15.5)
WBC: 11.1 10*3/uL — ABNORMAL HIGH (ref 4.0–10.5)
nRBC: 0 % (ref 0.0–0.2)

## 2023-10-21 NOTE — Plan of Care (Signed)

## 2023-10-21 NOTE — Progress Notes (Signed)
   Subjective: 2 Days Post-Op Procedure(s) (LRB): PATELLAR TENDON REPAIR (Left) Patient seen in rounds for Dr. Lequita Halt. Patient is well, and has had no acute complaints or problems. No issues overnight.  Patient reports pain as moderate.  Worked with PT yesterday but limited by severely arthritic right knee.  Objective: Vital signs in last 24 hours: Temp:  [97.9 F (36.6 C)-98.5 F (36.9 C)] 98 F (36.7 C) (01/17 0553) Pulse Rate:  [65-91] 74 (01/17 0553) Resp:  [15-18] 15 (01/17 0553) BP: (112-132)/(50-58) 128/55 (01/17 0553) SpO2:  [95 %-96 %] 96 % (01/17 0553)  Intake/Output from previous day:  Intake/Output Summary (Last 24 hours) at 10/21/2023 0811 Last data filed at 10/21/2023 0600 Gross per 24 hour  Intake 1358.01 ml  Output 250 ml  Net 1108.01 ml    Intake/Output this shift: No intake/output data recorded.  Labs: Recent Labs    10/20/23 0345 10/21/23 0356  HGB 10.4* 9.6*   Recent Labs    10/20/23 0345 10/21/23 0356  WBC 9.2 11.1*  RBC 3.59* 3.33*  HCT 33.2* 31.3*  PLT 234 264   Recent Labs    10/20/23 0345  NA 133*  K 3.7  CL 99  CO2 26  BUN 17  CREATININE 0.80  GLUCOSE 139*  CALCIUM 8.5*   No results for input(s): "LABPT", "INR" in the last 72 hours.  Exam: General - Patient is Alert and Oriented Extremity - Neurologically intact Neurovascular intact Sensation intact distally Dorsiflexion/Plantar flexion intact Dressing/Incision - Blood pooling on Aquacel. Aquacel removed. Two areas at distal incision with active drainage but incision remains well approximated. Motor Function - intact, moving foot and toes well on exam.  Past Medical History:  Diagnosis Date   Anemia    Anxiety    Diverticulosis    Frequent UTI    sees Dr. Earlene Plater   GERD (gastroesophageal reflux disease)    IBS (irritable bowel syndrome)    Leg edema    Osteoarthritis    Status post dilation of esophageal narrowing    Varicose veins of legs    sees [facility]     Assessment/Plan: 2 Days Post-Op Procedure(s) (LRB): PATELLAR TENDON REPAIR (Left) Principal Problem:   Peri-prosthetic patellar fracture Active Problems:   Patellar tendon rupture, left, initial encounter  Estimated body mass index is 33.96 kg/m as calculated from the following:   Height as of this encounter: 5' 5.5" (1.664 m).   Weight as of this encounter: 94 kg.  DVT Prophylaxis - Aspirin Weight-bearing as tolerated. Maintain knee immobilizer at all times.  Continue physical therapy. Hopeful to d/c home today pending progress with physical therapy and if meeting goals. Follow-up in clinic in 2 weeks.  Alfonzo Feller, PA-C Orthopedic Surgery 10/21/2023, 8:11 AM

## 2023-10-21 NOTE — Progress Notes (Signed)
Orthopedic Tech Progress Note Patient Details:  Crystal Little Apr 19, 1941 [id number] Knee immobilizer was delivered to bedside.  Ortho Devices Type of Ortho Device: Knee Immobilizer Ortho Device/Splint Interventions: Ordered      Shaquitta Burbridge E Terrel Nesheiwat 10/21/2023, 4:11 PM

## 2023-10-21 NOTE — Progress Notes (Signed)
Physical Therapy Treatment Patient Details Name: Crystal Little MRN: [id number] DOB: October 30, 1940 Today's Date: 10/21/2023   History of Present Illness Pt s/p L patellar tendon repair and with hx of back surgery and L TKR (24)    PT Comments  Pt continues to progress with mobility including up to ambulate in hall, negotiated stairs, up to bathroom for toileting and hand hygiene standing at sink, and with noted marked improvement with transfers sit <>stand and stability in standing.  Noted increased bleeding under dressing - nursing is aware and monitoring.    If plan is discharge home, recommend the following: A little help with bathing/dressing/bathroom;Assistance with cooking/housework;Assist for transportation;Help with stairs or ramp for entrance;A little help with walking and/or transfers   Can travel by private vehicle        Equipment Recommendations  None recommended by PT    Recommendations for Other Services       Precautions / Restrictions Precautions Precautions: Fall;Knee Precaution Comments: NO ROM L knee Required Braces or Orthoses: Knee Immobilizer - Left Knee Immobilizer - Left: On at all times Restrictions Weight Bearing Restrictions Per Provider Order: Yes LLE Weight Bearing Per Provider Order: Weight bearing as tolerated     Mobility  Bed Mobility Overal bed mobility: Needs Assistance Bed Mobility: Supine to Sit     Supine to sit: Contact guard     General bed mobility comments: UP in chair and returns to same.  Pt reports she has lift chair and intends to sleep in it initially    Transfers Overall transfer level: Needs assistance Equipment used: Rolling walker (2 wheels) Transfers: Sit to/from Stand Sit to Stand: Min assist           General transfer comment: cues for LE management and use of UEs to self assist; physical assist to bring wt up and fwd and to balance in standing with RW - to/from recliner and BSC     Ambulation/Gait Ambulation/Gait assistance: Contact guard assist, Supervision Gait Distance (Feet): 140 Feet (and 15' twice to/from bathroom) Assistive device: Rolling walker (2 wheels) Gait Pattern/deviations: Step-to pattern, Decreased step length - right, Decreased step length - left, Shuffle, Trunk flexed Gait velocity: decr     General Gait Details: Increased time with cues for sequence, posture and position from RW   Stairs Stairs: Yes Stairs assistance: Min assist Stair Management: One rail Right, Step to pattern, Forwards, With cane Number of Stairs: 5 General stair comments: 2+3 stairs with cues for sequence and cane placement   Wheelchair Mobility     Tilt Bed    Modified Rankin (Stroke Patients Only)       Balance Overall balance assessment: Needs assistance Sitting-balance support: Feet supported, No upper extremity supported Sitting balance-Leahy Scale: Good     Standing balance support: No upper extremity supported Standing balance-Leahy Scale: Fair                              Cognition Arousal: Alert Behavior During Therapy: WFL for tasks assessed/performed Overall Cognitive Status: Within Functional Limits for tasks assessed                                          Exercises General Exercises - Lower Extremity Ankle Circles/Pumps: AROM, Both, 15 reps, Supine    General Comments  Pertinent Vitals/Pain Pain Assessment Pain Assessment: 0-10 Pain Score: 2  Pain Location: L knee Pain Descriptors / Indicators: Aching, Sore Pain Intervention(s): Limited activity within patient's tolerance, Monitored during session, Premedicated before session, Ice applied    Home Living                          Prior Function            PT Goals (current goals can now be found in the care plan section) Acute Rehab PT Goals Patient Stated Goal: Regain IND PT Goal Formulation: With patient Time For Goal  Achievement: 11/03/23 Potential to Achieve Goals: Good Progress towards PT goals: Progressing toward goals    Frequency    7X/week      PT Plan      Co-evaluation              AM-PAC PT "6 Clicks" Mobility   Outcome Measure  Help needed turning from your back to your side while in a flat bed without using bedrails?: A Little Help needed moving from lying on your back to sitting on the side of a flat bed without using bedrails?: A Little Help needed moving to and from a bed to a chair (including a wheelchair)?: A Little Help needed standing up from a chair using your arms (e.g., wheelchair or bedside chair)?: A Little Help needed to walk in hospital room?: A Little Help needed climbing 3-5 steps with a railing? : A Little 6 Click Score: 18    End of Session Equipment Utilized During Treatment: Gait belt;Left knee immobilizer Activity Tolerance: Patient tolerated treatment well Patient left: in chair;with call bell/phone within reach;with chair alarm set Nurse Communication: Mobility status PT Visit Diagnosis: Muscle weakness (generalized) (M62.81);Difficulty in walking, not elsewhere classified (R26.2);Unsteadiness on feet (R26.81)     Time: 1191-4782 PT Time Calculation (min) (ACUTE ONLY): 56 min  Charges:    $Gait Training: 8-22 mins $Therapeutic Activity: 23-37 mins PT General Charges $$ ACUTE PT VISIT: 1 Visit                     Mauro Kaufmann PT Acute Rehabilitation Services Pager [phone] Office [phone]    Guido Comp 10/21/2023, 3:11 PM

## 2023-10-21 NOTE — Progress Notes (Signed)
Physical Therapy Treatment Patient Details Name: Crystal Little MRN: [id number] DOB: 11/11/1940 Today's Date: 10/21/2023   History of Present Illness Pt s/p L patellar tendon repair and with hx of back surgery and L TKR (24)    PT Comments  Pt continues very cooperative with continued improvement in quality of gait and distance ambulated.  Also noted, decreasing level of assist for bed mobility and transfers.  This am, pt up to ambulate increased distance in hall and up to bathroom for toileting and hand hygiene standing at sink.  Pt hopeful for dc home this pm.    If plan is discharge home, recommend the following: A lot of help with walking and/or transfers;A little help with bathing/dressing/bathroom;Assistance with cooking/housework;Assist for transportation;Help with stairs or ramp for entrance   Can travel by private vehicle        Equipment Recommendations  None recommended by PT    Recommendations for Other Services       Precautions / Restrictions Precautions Precautions: Fall;Knee Precaution Comments: NO ROM L knee Required Braces or Orthoses: Knee Immobilizer - Left Knee Immobilizer - Left: On at all times Restrictions Weight Bearing Restrictions Per Provider Order: Yes LLE Weight Bearing Per Provider Order: Weight bearing as tolerated (WBAT with KI in place)     Mobility  Bed Mobility Overal bed mobility: Needs Assistance Bed Mobility: Supine to Sit     Supine to sit: Contact guard     General bed mobility comments: increased time with CGA for L LE safety only    Transfers Overall transfer level: Needs assistance Equipment used: Rolling walker (2 wheels) Transfers: Sit to/from Stand Sit to Stand: Min assist, Mod assist, From elevated surface           General transfer comment: cues for LE management and use of UEs to self assist; physical assist to bring wt up and fwd and to balance in standing with RW    Ambulation/Gait Ambulation/Gait  assistance: Min assist, Contact guard assist Gait Distance (Feet): 140 Feet (and 15' x 2 to/from bathroom) Assistive device: Rolling walker (2 wheels) Gait Pattern/deviations: Step-to pattern, Decreased step length - right, Decreased step length - left, Shuffle, Trunk flexed Gait velocity: decr     General Gait Details: Increased time with cues for sequence, posture and position from [employer]             Wheelchair Mobility     Tilt Bed    Modified Rankin (Stroke Patients Only)       Balance Overall balance assessment: Needs assistance Sitting-balance support: Feet supported, No upper extremity supported Sitting balance-Leahy Scale: Good     Standing balance support: Single extremity supported Standing balance-Leahy Scale: Poor                              Cognition Arousal: Alert Behavior During Therapy: WFL for tasks assessed/performed Overall Cognitive Status: Within Functional Limits for tasks assessed                                          Exercises General Exercises - Lower Extremity Ankle Circles/Pumps: AROM, Both, 15 reps, Supine    General Comments        Pertinent Vitals/Pain Pain Assessment Pain Assessment: 0-10 Pain Score: 3  Pain Location: L knee Pain Descriptors / Indicators:  Aching, Sore Pain Intervention(s): Limited activity within patient's tolerance, Monitored during session, Premedicated before session, Ice applied    Home Living                          Prior Function            PT Goals (current goals can now be found in the care plan section) Acute Rehab PT Goals Patient Stated Goal: Regain IND PT Goal Formulation: With patient Time For Goal Achievement: 11/03/23 Potential to Achieve Goals: Good Progress towards PT goals: Progressing toward goals    Frequency    7X/week      PT Plan      Co-evaluation              AM-PAC PT "6 Clicks" Mobility   Outcome  Measure  Help needed turning from your back to your side while in a flat bed without using bedrails?: A Little Help needed moving from lying on your back to sitting on the side of a flat bed without using bedrails?: A Little Help needed moving to and from a bed to a chair (including a wheelchair)?: A Lot Help needed standing up from a chair using your arms (e.g., wheelchair or bedside chair)?: A Lot Help needed to walk in hospital room?: A Little Help needed climbing 3-5 steps with a railing? : Total 6 Click Score: 14    End of Session Equipment Utilized During Treatment: Gait belt;Left knee immobilizer Activity Tolerance: Patient tolerated treatment well Patient left: in chair;with call bell/phone within reach;with chair alarm set Nurse Communication: Mobility status PT Visit Diagnosis: Muscle weakness (generalized) (M62.81);Difficulty in walking, not elsewhere classified (R26.2);Unsteadiness on feet (R26.81)     Time: 2956-2130 PT Time Calculation (min) (ACUTE ONLY): 23 min  Charges:    $Gait Training: 8-22 mins $Therapeutic Activity: 8-22 mins PT General Charges $$ ACUTE PT VISIT: 1 Visit                     Mauro Kaufmann PT Acute Rehabilitation Services Pager [phone] Office [phone]    Crystal Little 10/21/2023, 3:02 PM

## 2023-11-01 NOTE — Discharge Summary (Signed)
Patient ID: Crystal Little MRN: [id number] DOB/AGE: Feb 05, 1941 83 y.o.  Admit date: 10/19/2023 Discharge date: 10/21/2023  Admission Diagnoses:  Principal Problem:   Peri-prosthetic patellar fracture Active Problems:   Patellar tendon rupture, left, initial encounter   Discharge Diagnoses:  Same  Past Medical History:  Diagnosis Date   Anemia    Anxiety    Diverticulosis    Frequent UTI    sees Dr. Earlene Plater   GERD (gastroesophageal reflux disease)    IBS (irritable bowel syndrome)    Leg edema    Osteoarthritis    Status post dilation of esophageal narrowing    Varicose veins of legs    sees [facility]     Surgeries: Procedure(s): PATELLAR TENDON REPAIR on 10/19/2023   Consultants:   Discharged Condition: Improved  Hospital Course: Crystal Little is an 83 y.o. female who was admitted 10/19/2023 for operative treatment ofPeri-prosthetic patellar fracture. Patient has severe unremitting pain that affects sleep, daily activities, and work/hobbies. After pre-op clearance the patient was taken to the operating room on 10/19/2023 and underwent  Procedure(s): PATELLAR TENDON REPAIR.    Patient was given perioperative antibiotics:  Anti-infectives (From admission, onward)    Start     Dose/Rate Route Frequency Ordered Stop   10/19/23 2200  ceFAZolin (ANCEF) IVPB 2g/100 mL premix        2 g 200 mL/hr over 30 Minutes Intravenous Every 6 hours 10/19/23 2027 10/21/23 0431   10/19/23 1430  ceFAZolin (ANCEF) IVPB 2g/100 mL premix        2 g 200 mL/hr over 30 Minutes Intravenous On call to O.R. 10/19/23 1400 10/19/23 1627        Patient was given sequential compression devices, early ambulation, and chemoprophylaxis to prevent DVT.  Patient benefited maximally from hospital stay and there were no complications.    Recent vital signs: No data found.   Recent laboratory studies: No results for input(s): "WBC", "HGB", "HCT", "PLT", "NA", "K", "CL", "CO2", "BUN",  "CREATININE", "GLUCOSE", "INR", "CALCIUM" in the last 72 hours.  Invalid input(s): "PT", "2"   Discharge Medications:   Allergies as of 10/21/2023       Reactions   Amoxicillin    Unknown reaction         Medication List     STOP taking these medications    phentermine 37.5 MG capsule       TAKE these medications    ALPRAZolam 0.25 MG tablet Commonly known as: XANAX Take 1 tablet (0.25 mg total) by mouth at bedtime as needed for anxiety. What changed: when to take this   ascorbic acid 500 MG tablet Commonly known as: VITAMIN C Take 500 mg by mouth daily.   aspirin 81 MG chewable tablet Chew 1 tablet (81 mg total) by mouth 2 (two) times daily for 20 days. Then take one 81 mg aspirin once a day for three weeks. Then discontinue aspirin.   cholecalciferol 25 MCG (1000 UNIT) tablet Commonly known as: VITAMIN D3 Take 1,000 Units by mouth daily.   Hydrocortisone 2 % Crea Apply 1 Application topically daily as needed (leg itching).   Iron (Ferrous Sulfate) 325 (65 Fe) MG Tabs Take 325 mg by mouth daily.   methocarbamol 500 MG tablet Commonly known as: ROBAXIN Take 1 tablet (500 mg total) by mouth every 6 (six) hours as needed for muscle spasms. What changed: when to take this   omeprazole 40 MG capsule Commonly known as: PRILOSEC Take 1 capsule (40  mg total) by mouth daily.   ondansetron 4 MG tablet Commonly known as: ZOFRAN Take 1 tablet (4 mg total) by mouth every 6 (six) hours as needed for nausea.   oxyCODONE 5 MG immediate release tablet Commonly known as: Oxy IR/ROXICODONE Take 1-2 tablets (5-10 mg total) by mouth every 6 (six) hours as needed for severe pain (pain score 7-10).   THERATEARS OP Place 1 drop into both eyes daily as needed (dry eyes).   torsemide 20 MG tablet Commonly known as: DEMADEX TAKE 2 TABLETS BY MOUTH TWICE A DAY What changed: when to take this   traMADol 50 MG tablet Commonly known as: ULTRAM Take 1-2 tablets (50-100 mg  total) by mouth every 6 (six) hours as needed for moderate pain (pain score 4-6).   VIACTIV CALCIUM PLUS D PO Take 1 tablet by mouth daily.   vitamin E 180 MG (400 UNITS) capsule Take 400 Units by mouth daily.               Discharge Care Instructions  (From admission, onward)           Start     Ordered   10/20/23 0000  Weight bearing as tolerated        10/20/23 0735   10/20/23 0000  Change dressing       Comments: You may remove the bulky bandage (ACE wrap and gauze) two days after surgery. You will have an adhesive waterproof bandage underneath. Leave this in place until your first follow-up appointment.   10/20/23 0735            Diagnostic Studies: No results found.  Disposition: Discharge disposition: 01-Home or Self Care       Discharge Instructions     Call MD / Call 911   Complete by: As directed    If you experience chest pain or shortness of breath, CALL 911 and be transported to the hospital emergency room.  If you develope a fever above 101 F, pus (white drainage) or increased drainage or redness at the wound, or calf pain, call your surgeon's office.   Change dressing   Complete by: As directed    You may remove the bulky bandage (ACE wrap and gauze) two days after surgery. You will have an adhesive waterproof bandage underneath. Leave this in place until your first follow-up appointment.   Constipation Prevention   Complete by: As directed    Drink plenty of fluids.  Prune juice may be helpful.  You may use a stool softener, such as Colace (over the counter) 100 mg twice a day.  Use MiraLax (over the counter) for constipation as needed.   Diet - low sodium heart healthy   Complete by: As directed    Do not put a pillow under the knee. Place it under the heel.   Complete by: As directed    Driving restrictions   Complete by: As directed    No driving for two weeks   Post-operative opioid taper instructions:   Complete by: As directed     POST-OPERATIVE OPIOID TAPER INSTRUCTIONS: It is important to wean off of your opioid medication as soon as possible. If you do not need pain medication after your surgery it is ok to stop day one. Opioids include: Codeine, Hydrocodone(Norco, Vicodin), Oxycodone(Percocet, oxycontin) and hydromorphone amongst others.  Long term and even short term use of opiods can cause: Increased pain response Dependence Constipation Depression Respiratory depression And more.  Withdrawal symptoms can  include Flu like symptoms Nausea, vomiting And more Techniques to manage these symptoms Hydrate well Eat regular healthy meals Stay active Use relaxation techniques(deep breathing, meditating, yoga) Do Not substitute Alcohol to help with tapering If you have been on opioids for less than two weeks and do not have pain than it is ok to stop all together.  Plan to wean off of opioids This plan should start within one week post op of your joint replacement. Maintain the same interval or time between taking each dose and first decrease the dose.  Cut the total daily intake of opioids by one tablet each day Next start to increase the time between doses. The last dose that should be eliminated is the evening dose.      TED hose   Complete by: As directed    Use stockings (TED hose) for three weeks on both leg(s).  You may remove them at night for sleeping.   Weight bearing as tolerated   Complete by: As directed         Follow-up Information     Aluisio, Homero Fellers, MD. Schedule an appointment as soon as possible for a visit in 2 week(s).   Specialty: Orthopedic Surgery Contact information: [address] [place] [address] [phone]                  Signed: Arther Abbott 11/01/2023, 12:59 PM

## 2023-12-21 DIAGNOSIS — M792 Neuralgia and neuritis, unspecified: Secondary | ICD-10-CM | POA: Diagnosis not present

## 2023-12-21 DIAGNOSIS — L603 Nail dystrophy: Secondary | ICD-10-CM | POA: Diagnosis not present

## 2023-12-21 DIAGNOSIS — R609 Edema, unspecified: Secondary | ICD-10-CM | POA: Diagnosis not present

## 2023-12-21 DIAGNOSIS — L602 Onychogryphosis: Secondary | ICD-10-CM | POA: Diagnosis not present

## 2024-01-05 ENCOUNTER — Ambulatory Visit (INDEPENDENT_AMBULATORY_CARE_PROVIDER_SITE_OTHER): Admitting: Family Medicine

## 2024-01-05 ENCOUNTER — Encounter: Payer: Self-pay | Admitting: Family Medicine

## 2024-01-05 ENCOUNTER — Ambulatory Visit: Admitting: Family Medicine

## 2024-01-05 ENCOUNTER — Telehealth: Payer: Self-pay

## 2024-01-05 VITALS — BP 110/58 | HR 67 | Temp 97.7°F | Wt 209.0 lb

## 2024-01-05 DIAGNOSIS — M7989 Other specified soft tissue disorders: Secondary | ICD-10-CM | POA: Diagnosis not present

## 2024-01-05 DIAGNOSIS — I83893 Varicose veins of bilateral lower extremities with other complications: Secondary | ICD-10-CM

## 2024-01-05 DIAGNOSIS — R609 Edema, unspecified: Secondary | ICD-10-CM

## 2024-01-05 MED ORDER — ALPRAZOLAM 0.25 MG PO TABS: 0.2500 mg | ORAL_TABLET | Freq: Every day | ORAL | 1 refills | Status: DC

## 2024-01-05 NOTE — Telephone Encounter (Signed)
 Pt has appointment with Dr Clent Ridges this afternoon

## 2024-01-05 NOTE — Progress Notes (Signed)
   Subjective:    Patient ID: Crystal Little, female    DOB: [birth year], 83 y.o.   MRN: [id number]  HPI Here for several issues. First she asks about her chronic leg swelling. She has had this for years, and this has been fairly well controlled for a few years with Torsemide. She was originally taking a total of 40 mg BID, but she stopped the evening doses because she had to run to the bathroom all night. 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 On 10-19-23 she had a left patellar tendon repair per Dr. Lequita Halt, and she has had extra swelling since then. The swelling is uncomfortable and makes her rehab more difficult. Of note on 10-19-23 her creatinine was 0.80 and her GFR was >60. She also asks advice about the varicose veins in her legs. She has been seeing the [facility] for years, but she has become dissatisfied with them. She ask to see someone for a second opinion.    Review of Systems  Constitutional: Negative.   HENT:  Positive for hearing loss. Negative for congestion, ear discharge, ear pain and sinus pain.   Respiratory: Negative.    Cardiovascular:  Positive for leg swelling. Negative for chest pain and palpitations.  Musculoskeletal:  Positive for arthralgias.       Objective:   Physical Exam Constitutional:      Appearance: Normal appearance.     Comments: Walks with her walker   HENT:     Right Ear: There is impacted cerumen.     Left Ear: There is impacted cerumen.  Cardiovascular:     Rate and Rhythm: Normal rate and regular rhythm.     Pulses: Normal pulses.     Heart sounds: Normal heart sounds.  Musculoskeletal:     Comments: 2+ edema in the right lower leg and 3+ edema in the right lower leg   Neurological:     Mental Status: She is alert.           Assessment & Plan:  For the leg edema, she will continue to take two 20 mg tablets of Torsemide in the mornings, and she will add one  tablet in the afternoons. For the varicose veins, we will refer her to Vascular Surgery.  Gershon Crane, MD

## 2024-01-16 DIAGNOSIS — M25562 Pain in left knee: Secondary | ICD-10-CM | POA: Diagnosis not present

## 2024-01-16 DIAGNOSIS — M25662 Stiffness of left knee, not elsewhere classified: Secondary | ICD-10-CM | POA: Diagnosis not present

## 2024-01-18 DIAGNOSIS — M25562 Pain in left knee: Secondary | ICD-10-CM | POA: Diagnosis not present

## 2024-01-18 DIAGNOSIS — M25662 Stiffness of left knee, not elsewhere classified: Secondary | ICD-10-CM | POA: Diagnosis not present

## 2024-01-20 DIAGNOSIS — M25662 Stiffness of left knee, not elsewhere classified: Secondary | ICD-10-CM | POA: Diagnosis not present

## 2024-01-20 DIAGNOSIS — M25562 Pain in left knee: Secondary | ICD-10-CM | POA: Diagnosis not present

## 2024-01-23 DIAGNOSIS — M25562 Pain in left knee: Secondary | ICD-10-CM | POA: Diagnosis not present

## 2024-01-23 DIAGNOSIS — M25662 Stiffness of left knee, not elsewhere classified: Secondary | ICD-10-CM | POA: Diagnosis not present

## 2024-01-25 DIAGNOSIS — M25562 Pain in left knee: Secondary | ICD-10-CM | POA: Diagnosis not present

## 2024-01-25 DIAGNOSIS — M25662 Stiffness of left knee, not elsewhere classified: Secondary | ICD-10-CM | POA: Diagnosis not present

## 2024-01-30 DIAGNOSIS — M25562 Pain in left knee: Secondary | ICD-10-CM | POA: Diagnosis not present

## 2024-01-30 DIAGNOSIS — M25662 Stiffness of left knee, not elsewhere classified: Secondary | ICD-10-CM | POA: Diagnosis not present

## 2024-02-02 DIAGNOSIS — M25662 Stiffness of left knee, not elsewhere classified: Secondary | ICD-10-CM | POA: Diagnosis not present

## 2024-02-02 DIAGNOSIS — M25562 Pain in left knee: Secondary | ICD-10-CM | POA: Diagnosis not present

## 2024-02-08 DIAGNOSIS — M25662 Stiffness of left knee, not elsewhere classified: Secondary | ICD-10-CM | POA: Diagnosis not present

## 2024-02-08 DIAGNOSIS — M25562 Pain in left knee: Secondary | ICD-10-CM | POA: Diagnosis not present

## 2024-02-10 DIAGNOSIS — M25562 Pain in left knee: Secondary | ICD-10-CM | POA: Diagnosis not present

## 2024-02-10 DIAGNOSIS — M25662 Stiffness of left knee, not elsewhere classified: Secondary | ICD-10-CM | POA: Diagnosis not present

## 2024-02-15 DIAGNOSIS — M25662 Stiffness of left knee, not elsewhere classified: Secondary | ICD-10-CM | POA: Diagnosis not present

## 2024-02-15 DIAGNOSIS — M25562 Pain in left knee: Secondary | ICD-10-CM | POA: Diagnosis not present

## 2024-02-17 DIAGNOSIS — M25662 Stiffness of left knee, not elsewhere classified: Secondary | ICD-10-CM | POA: Diagnosis not present

## 2024-02-17 DIAGNOSIS — M25562 Pain in left knee: Secondary | ICD-10-CM | POA: Diagnosis not present

## 2024-02-22 DIAGNOSIS — M25662 Stiffness of left knee, not elsewhere classified: Secondary | ICD-10-CM | POA: Diagnosis not present

## 2024-02-22 DIAGNOSIS — M25562 Pain in left knee: Secondary | ICD-10-CM | POA: Diagnosis not present

## 2024-02-24 DIAGNOSIS — M25662 Stiffness of left knee, not elsewhere classified: Secondary | ICD-10-CM | POA: Diagnosis not present

## 2024-02-24 DIAGNOSIS — M25562 Pain in left knee: Secondary | ICD-10-CM | POA: Diagnosis not present

## 2024-02-28 ENCOUNTER — Ambulatory Visit
Admission: RE | Admit: 2024-02-28 | Discharge: 2024-02-28 | Disposition: A | Discharge status: Home / Self Care | Payer: Medicare Other | Source: Ambulatory Visit | Attending: Obstetrics and Gynecology | Admitting: Obstetrics and Gynecology

## 2024-02-28 DIAGNOSIS — Z13820 Encounter for screening for osteoporosis: Secondary | ICD-10-CM

## 2024-02-28 DIAGNOSIS — M8588 Other specified disorders of bone density and structure, other site: Secondary | ICD-10-CM | POA: Diagnosis not present

## 2024-02-29 ENCOUNTER — Other Ambulatory Visit: Payer: Self-pay | Admitting: Vascular Surgery

## 2024-02-29 DIAGNOSIS — M25562 Pain in left knee: Secondary | ICD-10-CM | POA: Diagnosis not present

## 2024-02-29 DIAGNOSIS — M7989 Other specified soft tissue disorders: Secondary | ICD-10-CM

## 2024-02-29 DIAGNOSIS — M25662 Stiffness of left knee, not elsewhere classified: Secondary | ICD-10-CM | POA: Diagnosis not present

## 2024-03-02 DIAGNOSIS — M25562 Pain in left knee: Secondary | ICD-10-CM | POA: Diagnosis not present

## 2024-03-02 DIAGNOSIS — M25662 Stiffness of left knee, not elsewhere classified: Secondary | ICD-10-CM | POA: Diagnosis not present

## 2024-03-06 DIAGNOSIS — M170 Bilateral primary osteoarthritis of knee: Secondary | ICD-10-CM | POA: Diagnosis not present

## 2024-03-07 DIAGNOSIS — M25562 Pain in left knee: Secondary | ICD-10-CM | POA: Diagnosis not present

## 2024-03-13 DIAGNOSIS — M8580 Other specified disorders of bone density and structure, unspecified site: Secondary | ICD-10-CM | POA: Diagnosis not present

## 2024-03-14 DIAGNOSIS — M25562 Pain in left knee: Secondary | ICD-10-CM | POA: Diagnosis not present

## 2024-03-14 DIAGNOSIS — M25662 Stiffness of left knee, not elsewhere classified: Secondary | ICD-10-CM | POA: Diagnosis not present

## 2024-03-16 DIAGNOSIS — M25662 Stiffness of left knee, not elsewhere classified: Secondary | ICD-10-CM | POA: Diagnosis not present

## 2024-03-16 DIAGNOSIS — M25562 Pain in left knee: Secondary | ICD-10-CM | POA: Diagnosis not present

## 2024-03-20 ENCOUNTER — Ambulatory Visit (HOSPITAL_COMMUNITY)
Admission: RE | Admit: 2024-03-20 | Discharge: 2024-03-20 | Disposition: A | Discharge status: Home / Self Care | Source: Ambulatory Visit | Attending: Vascular Surgery | Admitting: Vascular Surgery

## 2024-03-20 ENCOUNTER — Encounter: Payer: Self-pay | Admitting: Vascular Surgery

## 2024-03-20 ENCOUNTER — Ambulatory Visit: Attending: Vascular Surgery | Admitting: Vascular Surgery

## 2024-03-20 VITALS — BP 146/73 | HR 78 | Temp 98.4°F | Resp 22 | Ht 65.5 in | Wt 209.0 lb

## 2024-03-20 DIAGNOSIS — I872 Venous insufficiency (chronic) (peripheral): Secondary | ICD-10-CM | POA: Insufficient documentation

## 2024-03-20 DIAGNOSIS — M7989 Other specified soft tissue disorders: Secondary | ICD-10-CM | POA: Diagnosis not present

## 2024-03-20 DIAGNOSIS — I890 Lymphedema, not elsewhere classified: Secondary | ICD-10-CM | POA: Insufficient documentation

## 2024-03-20 NOTE — Progress Notes (Signed)
 Patient name: Crystal Little MRN: [id number] DOB: [birth year] Sex: female  REASON FOR CONSULT: Leg swelling  HPI: Crystal Little is a 83 y.o. female, with history of varicose veins that presents for evaluation of leg swelling.  Patient notes bilateral lower extremity swelling for years.  She has been under the care of [facility].  States she has had vein closure procedures for both lower extremities.  Recently had left knee replacement last year and this has limited her mobility and her swelling has gotten worse.  She has pneumatic compression pumps that she wears at least 1 hour a day.  States it is hard for her to wear compression stockings.  She does have a bed that elevates her legs as well at night.  Past Medical History:  Diagnosis Date   Anemia    Anxiety    Diverticulosis    Frequent UTI    sees Dr. Nolon Baxter   GERD (gastroesophageal reflux disease)    IBS (irritable bowel syndrome)    Leg edema    Osteoarthritis    Status post dilation of esophageal narrowing    Varicose veins of legs    sees [facility]     Past Surgical History:  Procedure Laterality Date   BACK SURGERY  10/2021   lumbar 4-5   COLONOSCOPY  04/26/2011   per Dr. Adan Holms, diverticulosis only, no repeats needed   endovascular repair of bilateral leg varicosities     per Dr. Enedina Harrow    ESOPHAGOGASTRODUODENOSCOPY  01/19/2010   per Dr. Adan Holms, with dilatation    ESOPHAGOGASTRODUODENOSCOPY (EGD) WITH ESOPHAGEAL DILATION     PATELLAR TENDON REPAIR Left 10/19/2023   Procedure: PATELLAR TENDON REPAIR;  Surgeon: Liliane Rei, MD;  Location: [facility];  Service: Orthopedics;  Laterality: Left;   right knee arthroscopy  12/17/05 and 01-06-12   per Dr. Abigail Abler   TONSILLECTOMY     TOOTH EXTRACTION Right 09/20/2023   Lower right side with bone implant   TOTAL KNEE ARTHROPLASTY Left 04/18/2023   Procedure: TOTAL KNEE ARTHROPLASTY;  Surgeon: Liliane Rei, MD;  Location: [facility];  Service:  Orthopedics;  Laterality: Left;    Family History  Problem Relation Age of Onset   Irritable bowel syndrome Mother    Pancreatic cancer Paternal Grandmother    Diabetes Maternal Aunt    Diabetes Maternal Uncle    Heart disease Paternal Grandfather    Colon cancer Neg Hx    Colon polyps Neg Hx    Kidney disease Neg Hx     SOCIAL HISTORY: Social History   Socioeconomic History   Marital status: Single    Spouse name: Not on file   Number of children: 3   Years of education: Not on file   Highest education level: Not on file  Occupational History   Occupation: [employer]  Tobacco Use   Smoking status: Never   Smokeless tobacco: Never  Vaping Use   Vaping status: Never Used  Substance and Sexual Activity   Alcohol use: Not Currently    Alcohol/week: 2.0 standard drinks of alcohol    Types: 2 Glasses of wine per week    Comment: occ   Drug use: No   Sexual activity: Not Currently  Other Topics Concern   Not on file  Social History Narrative   Not on file   Social Drivers of Health   Financial Resource Strain: Low Risk  (06/13/2023)   Overall Financial Resource Strain (CARDIA)  Difficulty of Paying Living Expenses: Not hard at all  Food Insecurity: No Food Insecurity (10/19/2023)   Hunger Vital Sign    Worried About Running Out of Food in the Last Year: Never true    Ran Out of Food in the Last Year: Never true  Transportation Needs: No Transportation Needs (10/19/2023)   PRAPARE - Administrator, Civil Service (Medical): No    Lack of Transportation (Non-Medical): No  Physical Activity: Insufficiently Active (06/13/2023)   Exercise Vital Sign    Days of Exercise per Week: 2 days    Minutes of Exercise per Session: 40 min  Stress: No Stress Concern Present (06/13/2023)   [employer] of Occupational Health - Occupational Stress Questionnaire    Feeling of Stress : Not at all  Social Connections: Moderately Integrated (10/19/2023)   Social  Connection and Isolation Panel    Frequency of Communication with Friends and Family: More than three times a week    Frequency of Social Gatherings with Friends and Family: More than three times a week    Attends Religious Services: More than 4 times per year    Active Member of [employer] or Organizations: Yes    Attends Banker Meetings: More than 4 times per year    Marital Status: Widowed  Intimate Partner Violence: Not At Risk (10/19/2023)   Humiliation, Afraid, Rape, and Kick questionnaire    Fear of Current or Ex-Partner: No    Emotionally Abused: No    Physically Abused: No    Sexually Abused: No    Allergies  Allergen Reactions   Amoxicillin      Unknown reaction     Current Outpatient Medications  Medication Sig Dispense Refill   ALPRAZolam  (XANAX ) 0.25 MG tablet Take 1 tablet (0.25 mg total) by mouth at bedtime. 90 tablet 1   ascorbic acid (VITAMIN C) 500 MG tablet Take 500 mg by mouth daily.     Calcium-Vitamin D -Vitamin K (VIACTIV CALCIUM PLUS D PO) Take 1 tablet by mouth daily.     Carboxymethylcellulose Sodium (THERATEARS OP) Place 1 drop into both eyes daily as needed (dry eyes).     cholecalciferol (VITAMIN D3) 25 MCG (1000 UNIT) tablet Take 1,000 Units by mouth daily.     Hydrocortisone 2 % CREA Apply 1 Application topically daily as needed (leg itching).     Iron , Ferrous Sulfate , 325 (65 Fe) MG TABS Take 325 mg by mouth daily. 90 tablet 3   methocarbamol  (ROBAXIN ) 500 MG tablet Take 1 tablet (500 mg total) by mouth every 6 (six) hours as needed for muscle spasms. 40 tablet 0   omeprazole  (PRILOSEC) 40 MG capsule Take 1 capsule (40 mg total) by mouth daily. 90 capsule 3   ondansetron  (ZOFRAN ) 4 MG tablet Take 1 tablet (4 mg total) by mouth every 6 (six) hours as needed for nausea. 20 tablet 0   oxyCODONE  (OXY IR/ROXICODONE ) 5 MG immediate release tablet Take 1-2 tablets (5-10 mg total) by mouth every 6 (six) hours as needed for severe pain (pain score 7-10).  42 tablet 0   torsemide  (DEMADEX ) 20 MG tablet TAKE 2 TABLETS BY MOUTH TWICE A DAY (Patient taking differently: Take 40 mg by mouth daily.) 360 tablet 0   traMADol  (ULTRAM ) 50 MG tablet Take 1-2 tablets (50-100 mg total) by mouth every 6 (six) hours as needed for moderate pain (pain score 4-6). 40 tablet 0   vitamin E 180 MG (400 UNITS) capsule Take 400 Units by  mouth daily.     No current facility-administered medications for this visit.    REVIEW OF SYSTEMS:  [X]  denotes positive finding, [ ]  denotes negative finding Cardiac  Comments:  Chest pain or chest pressure:    Shortness of breath upon exertion:    Short of breath when lying flat:    Irregular heart rhythm:        Vascular    Pain in calf, thigh, or hip brought on by ambulation:    Pain in feet at night that wakes you up from your sleep:     Blood clot in your veins:    Leg swelling:  x       Pulmonary    Oxygen at home:    Productive cough:     Wheezing:         Neurologic    Sudden weakness in arms or legs:     Sudden numbness in arms or legs:     Sudden onset of difficulty speaking or slurred speech:    Temporary loss of vision in one eye:     Problems with dizziness:         Gastrointestinal    Blood in stool:     Vomited blood:         Genitourinary    Burning when urinating:     Blood in urine:        Psychiatric    Major depression:         Hematologic    Bleeding problems:    Problems with blood clotting too easily:        Skin    Rashes or ulcers:        Constitutional    Fever or chills:      PHYSICAL EXAM: There were no vitals filed for this visit.  GENERAL: The patient is a well-nourished female, in no acute distress. The vital signs are documented above. CARDIAC: There is a regular rate and rhythm.  VASCULAR:  Bilateral DP pulses palpable Skin thickening with positive Stemmer sign PULMONARY: No respiratory distress. ABDOMEN: Soft and non-tender. MUSCULOSKELETAL: There are no  major deformities or cyanosis. NEUROLOGIC: No focal weakness or paresthesias are detected. SKIN: There are no ulcers or rashes noted. PSYCHIATRIC: The patient has a normal affect.  DATA:    Lower Venous Reflux Study   Patient Name:  SWATHI DAUPHIN  Date of Exam:   03/20/2024  Medical Rec #: [id number]          Accession #:    [id number]  Date of Birth: 04/23/1941         Patient Gender: F  Patient Age:   62 years  Exam Location:  [address]  Procedure:      VAS US  LOWER EXTREMITY VENOUS REFLUX  Referring Phys: Jimmye Moulds    ---------------------------------------------------------------------------  -----    Indications: Lower extremity edema with history of vein ablation.    Performing Technologist: Parke Boll RVS, RCS     Examination Guidelines: A complete evaluation includes B-mode imaging,  spectral  Doppler, color Doppler, and power Doppler as needed of all accessible  portions  of each vessel. Bilateral testing is considered an integral part of a  complete  examination. Limited examinations for reoccurring indications may be  performed  as noted. The reflux portion of the exam is performed with the patient in  reverse Trendelenburg.  Significant venous reflux is defined as >500 ms in the superficial venous  system, and >  1 second in the deep venous system.     Venous Reflux Times  +---------------+---------+------+---------+-----------+-------------------  ---+  RIGHT         Reflux NoReflux Reflux   Diameter  Comments                                          Yes    Time       cms                             +---------------+---------+------+---------+-----------+-------------------  ---+  CFV                     yes  >1 second                                    +---------------+---------+------+---------+-----------+-------------------  ---+  FV mid         no                                                           +---------------+---------+------+---------+-----------+-------------------  ---+  Popliteal     no                                                          +---------------+---------+------+---------+-----------+-------------------  ---+  GSV at [facility]               yes   >500 ms    0.93                             +---------------+---------+------+---------+-----------+-------------------  ---+  GSV prox thigh                                    prior                                                                      ablation/stripping       +---------------+---------+------+---------+-----------+-------------------  ---+  GSV mid thigh                                     prior  ablation/stripping       +---------------+---------+------+---------+-----------+-------------------  ---+  GSV dist thigh                                    prior                                                                      ablation/stripping       +---------------+---------+------+---------+-----------+-------------------  ---+  GSV at knee                                       prior                                                                      ablation/stripping       +---------------+---------+------+---------+-----------+-------------------  ---+  GSV prox calf                                     prior                                                                      ablation/stripping       +---------------+---------+------+---------+-----------+-------------------  ---+  SSV Pop Fossa  no                         0.33    ablated just past                                                          origin                   +---------------+---------+------+---------+-----------+-------------------  ---+  SSV  prox calf                                     prior                                                                      ablation/stripping       +---------------+---------+------+---------+-----------+-------------------  ---+  medial        no                         0.42                             saphenous                                                                  +---------------+---------+------+---------+-----------+-------------------  ---+    Summary:  Right:  - No evidence of deep vein thrombosis from the common femoral through the  popliteal veins.  - No evidence of superficial venous thrombosis.  - The common femoral vein is not competent.  - Great and small saphenous veins not identified and consistent with  history of ablation.    *See table(s) above for measurements and observations.   Electronically signed by Jimmye Moulds MD on 03/20/2024 at 12:15:21 PM.   Assessment/Plan:  83 y.o. female, with history of varicose veins that presents for evaluation of chronic leg swelling.  I discussed that her lower extremity swelling is going to be a chronic problem for her and I discussed there really is no cure and it is more about management of her symptoms at this point.  I suspect this is a combination of chronic venous insufficiency with valvular reflux in addition to some underlying lymphedema.  Her right leg was studied today and there is no evidence of DVT as we discussed.  Her great saphenous vein remains closed after ablation at [facility] and no significant surface reflux..  Her remaining reflux is in the deep venous system and I discussed this is not amendable to further surgical intervention.  I discussed conservative measures with leg elevation, exercise, compression stockings and weight loss.  She is limited with her mobility and has difficult time wearing compression stockings.  We showed her how to use Ace wraps as an  alternative.  I discussed a wedge pillow with her but she has a bed that elevates her legs.  Also discussed the option pneumatic compression pumps but she already has these at home and wears them daily.  She can follow-up with me as needed.   Young Hensen, MD [facility] Office: [phone]

## 2024-03-21 DIAGNOSIS — M25662 Stiffness of left knee, not elsewhere classified: Secondary | ICD-10-CM | POA: Diagnosis not present

## 2024-03-21 DIAGNOSIS — M25562 Pain in left knee: Secondary | ICD-10-CM | POA: Diagnosis not present

## 2024-03-23 DIAGNOSIS — M25562 Pain in left knee: Secondary | ICD-10-CM | POA: Diagnosis not present

## 2024-03-23 DIAGNOSIS — M25662 Stiffness of left knee, not elsewhere classified: Secondary | ICD-10-CM | POA: Diagnosis not present

## 2024-04-03 DIAGNOSIS — M8580 Other specified disorders of bone density and structure, unspecified site: Secondary | ICD-10-CM | POA: Diagnosis not present

## 2024-04-04 DIAGNOSIS — M25562 Pain in left knee: Secondary | ICD-10-CM | POA: Diagnosis not present

## 2024-04-04 DIAGNOSIS — M25662 Stiffness of left knee, not elsewhere classified: Secondary | ICD-10-CM | POA: Diagnosis not present

## 2024-04-11 DIAGNOSIS — M25562 Pain in left knee: Secondary | ICD-10-CM | POA: Diagnosis not present

## 2024-04-11 DIAGNOSIS — M25662 Stiffness of left knee, not elsewhere classified: Secondary | ICD-10-CM | POA: Diagnosis not present

## 2024-04-18 DIAGNOSIS — M7989 Other specified soft tissue disorders: Secondary | ICD-10-CM | POA: Diagnosis not present

## 2024-04-18 DIAGNOSIS — I871 Compression of vein: Secondary | ICD-10-CM | POA: Diagnosis not present

## 2024-04-19 DIAGNOSIS — M25662 Stiffness of left knee, not elsewhere classified: Secondary | ICD-10-CM | POA: Diagnosis not present

## 2024-04-19 DIAGNOSIS — M25562 Pain in left knee: Secondary | ICD-10-CM | POA: Diagnosis not present

## 2024-04-25 DIAGNOSIS — M7989 Other specified soft tissue disorders: Secondary | ICD-10-CM | POA: Diagnosis not present

## 2024-04-25 DIAGNOSIS — K449 Diaphragmatic hernia without obstruction or gangrene: Secondary | ICD-10-CM | POA: Diagnosis not present

## 2024-04-25 DIAGNOSIS — I871 Compression of vein: Secondary | ICD-10-CM | POA: Diagnosis not present

## 2024-05-03 ENCOUNTER — Other Ambulatory Visit (HOSPITAL_COMMUNITY): Payer: Self-pay | Admitting: Physician Assistant

## 2024-05-03 ENCOUNTER — Ambulatory Visit (HOSPITAL_COMMUNITY)
Admission: RE | Admit: 2024-05-03 | Discharge: 2024-05-03 | Disposition: A | Discharge status: Home / Self Care | Source: Ambulatory Visit | Attending: Vascular Surgery | Admitting: Vascular Surgery

## 2024-05-03 DIAGNOSIS — M79662 Pain in left lower leg: Secondary | ICD-10-CM

## 2024-05-03 DIAGNOSIS — M7989 Other specified soft tissue disorders: Secondary | ICD-10-CM | POA: Insufficient documentation

## 2024-05-03 DIAGNOSIS — M1711 Unilateral primary osteoarthritis, right knee: Secondary | ICD-10-CM | POA: Diagnosis not present

## 2024-05-03 DIAGNOSIS — M79604 Pain in right leg: Secondary | ICD-10-CM | POA: Diagnosis not present

## 2024-05-03 DIAGNOSIS — M79605 Pain in left leg: Secondary | ICD-10-CM | POA: Diagnosis not present

## 2024-05-03 DIAGNOSIS — M1712 Unilateral primary osteoarthritis, left knee: Secondary | ICD-10-CM | POA: Diagnosis not present

## 2024-05-08 DIAGNOSIS — M7989 Other specified soft tissue disorders: Secondary | ICD-10-CM | POA: Diagnosis not present

## 2024-05-08 DIAGNOSIS — I871 Compression of vein: Secondary | ICD-10-CM | POA: Diagnosis not present

## 2024-05-10 DIAGNOSIS — M1711 Unilateral primary osteoarthritis, right knee: Secondary | ICD-10-CM | POA: Diagnosis not present

## 2024-05-16 DIAGNOSIS — I890 Lymphedema, not elsewhere classified: Secondary | ICD-10-CM | POA: Diagnosis not present

## 2024-05-16 DIAGNOSIS — I871 Compression of vein: Secondary | ICD-10-CM | POA: Diagnosis not present

## 2024-05-16 DIAGNOSIS — M7989 Other specified soft tissue disorders: Secondary | ICD-10-CM | POA: Diagnosis not present

## 2024-05-17 DIAGNOSIS — M25662 Stiffness of left knee, not elsewhere classified: Secondary | ICD-10-CM | POA: Diagnosis not present

## 2024-05-17 DIAGNOSIS — S86812D Strain of other muscle(s) and tendon(s) at lower leg level, left leg, subsequent encounter: Secondary | ICD-10-CM | POA: Diagnosis not present

## 2024-05-17 DIAGNOSIS — M1711 Unilateral primary osteoarthritis, right knee: Secondary | ICD-10-CM | POA: Diagnosis not present

## 2024-06-07 DIAGNOSIS — H52203 Unspecified astigmatism, bilateral: Secondary | ICD-10-CM | POA: Diagnosis not present

## 2024-06-07 DIAGNOSIS — H04123 Dry eye syndrome of bilateral lacrimal glands: Secondary | ICD-10-CM | POA: Diagnosis not present

## 2024-06-07 DIAGNOSIS — H26491 Other secondary cataract, right eye: Secondary | ICD-10-CM | POA: Diagnosis not present

## 2024-06-07 DIAGNOSIS — H43813 Vitreous degeneration, bilateral: Secondary | ICD-10-CM | POA: Diagnosis not present

## 2024-06-07 DIAGNOSIS — H40013 Open angle with borderline findings, low risk, bilateral: Secondary | ICD-10-CM | POA: Diagnosis not present

## 2024-06-19 DIAGNOSIS — I890 Lymphedema, not elsewhere classified: Secondary | ICD-10-CM | POA: Diagnosis not present

## 2024-06-19 DIAGNOSIS — L821 Other seborrheic keratosis: Secondary | ICD-10-CM | POA: Diagnosis not present

## 2024-06-19 DIAGNOSIS — Z85828 Personal history of other malignant neoplasm of skin: Secondary | ICD-10-CM | POA: Diagnosis not present

## 2024-06-19 DIAGNOSIS — Z08 Encounter for follow-up examination after completed treatment for malignant neoplasm: Secondary | ICD-10-CM | POA: Diagnosis not present

## 2024-06-19 DIAGNOSIS — L814 Other melanin hyperpigmentation: Secondary | ICD-10-CM | POA: Diagnosis not present

## 2024-06-19 DIAGNOSIS — L570 Actinic keratosis: Secondary | ICD-10-CM | POA: Diagnosis not present

## 2024-06-20 ENCOUNTER — Encounter: Payer: Self-pay | Admitting: Family Medicine

## 2024-06-20 ENCOUNTER — Ambulatory Visit (INDEPENDENT_AMBULATORY_CARE_PROVIDER_SITE_OTHER): Admitting: Family Medicine

## 2024-06-20 VITALS — BP 110/58 | HR 75 | Temp 97.9°F | Ht 65.0 in | Wt 205.0 lb

## 2024-06-20 DIAGNOSIS — I890 Lymphedema, not elsewhere classified: Secondary | ICD-10-CM

## 2024-06-20 DIAGNOSIS — R739 Hyperglycemia, unspecified: Secondary | ICD-10-CM | POA: Diagnosis not present

## 2024-06-20 DIAGNOSIS — Z23 Encounter for immunization: Secondary | ICD-10-CM

## 2024-06-20 DIAGNOSIS — K219 Gastro-esophageal reflux disease without esophagitis: Secondary | ICD-10-CM | POA: Diagnosis not present

## 2024-06-20 DIAGNOSIS — M7989 Other specified soft tissue disorders: Secondary | ICD-10-CM | POA: Diagnosis not present

## 2024-06-20 DIAGNOSIS — E66811 Obesity, class 1: Secondary | ICD-10-CM | POA: Insufficient documentation

## 2024-06-20 DIAGNOSIS — R609 Edema, unspecified: Secondary | ICD-10-CM

## 2024-06-20 DIAGNOSIS — D509 Iron deficiency anemia, unspecified: Secondary | ICD-10-CM | POA: Insufficient documentation

## 2024-06-20 DIAGNOSIS — M170 Bilateral primary osteoarthritis of knee: Secondary | ICD-10-CM | POA: Diagnosis not present

## 2024-06-20 LAB — CBC WITH DIFFERENTIAL/PLATELET
Basophils Absolute: 0.1 K/uL (ref 0.0–0.1)
Basophils Relative: 1 % (ref 0.0–3.0)
Eosinophils Absolute: 0.1 K/uL (ref 0.0–0.7)
Eosinophils Relative: 1.6 % (ref 0.0–5.0)
HCT: 37.6 % (ref 36.0–46.0)
Hemoglobin: 12.2 g/dL (ref 12.0–15.0)
Lymphocytes Relative: 28.5 % (ref 12.0–46.0)
Lymphs Abs: 2.2 K/uL (ref 0.7–4.0)
MCHC: 32.4 g/dL (ref 30.0–36.0)
MCV: 88.2 fl (ref 78.0–100.0)
Monocytes Absolute: 0.7 K/uL (ref 0.1–1.0)
Monocytes Relative: 9.1 % (ref 3.0–12.0)
Neutro Abs: 4.7 K/uL (ref 1.4–7.7)
Neutrophils Relative %: 59.8 % (ref 43.0–77.0)
Platelets: 302 K/uL (ref 150.0–400.0)
RBC: 4.27 Mil/uL (ref 3.87–5.11)
RDW: 15 % (ref 11.5–15.5)
WBC: 7.8 K/uL (ref 4.0–10.5)

## 2024-06-20 LAB — LIPID PANEL
Cholesterol: 171 mg/dL (ref 0–200)
HDL: 68 mg/dL (ref >39.00)
LDL Cholesterol: 92 mg/dL (ref 0–99)
NonHDL: 102.95
Total CHOL/HDL Ratio: 3
Triglycerides: 56 mg/dL (ref 0.0–149.0)
VLDL: 11.2 mg/dL (ref 0.0–40.0)

## 2024-06-20 LAB — IBC + FERRITIN
Ferritin: 38.7 ng/mL (ref 10.0–291.0)
Iron: 55 ug/dL (ref 42–145)
Saturation Ratios: 13.5 % — ABNORMAL LOW (ref 20.0–50.0)
TIBC: 406 ug/dL (ref 250.0–450.0)
Transferrin: 290 mg/dL (ref 212.0–360.0)

## 2024-06-20 LAB — BASIC METABOLIC PANEL WITH GFR
BUN: 15 mg/dL (ref 6–23)
CO2: 28 meq/L (ref 19–32)
Calcium: 9.9 mg/dL (ref 8.4–10.5)
Chloride: 103 meq/L (ref 96–112)
Creatinine, Ser: 0.69 mg/dL (ref 0.40–1.20)
GFR: 79.51 mL/min (ref >60.00)
Glucose, Bld: 97 mg/dL (ref 70–99)
Potassium: 4.3 meq/L (ref 3.5–5.1)
Sodium: 139 meq/L (ref 135–145)

## 2024-06-20 LAB — HEPATIC FUNCTION PANEL
ALT: 10 U/L (ref 0–35)
AST: 15 U/L (ref 0–37)
Albumin: 4.2 g/dL (ref 3.5–5.2)
Alkaline Phosphatase: 77 U/L (ref 39–117)
Bilirubin, Direct: 0.1 mg/dL (ref 0.0–0.3)
Total Bilirubin: 0.6 mg/dL (ref 0.2–1.2)
Total Protein: 7.4 g/dL (ref 6.0–8.3)

## 2024-06-20 LAB — HEMOGLOBIN A1C: Hgb A1c MFr Bld: 6.1 % (ref 4.6–6.5)

## 2024-06-20 LAB — TSH: TSH: 2.64 u[IU]/mL (ref 0.35–5.50)

## 2024-06-20 MED ORDER — IRON (FERROUS SULFATE) 325 (65 FE) MG PO TABS: 325.0000 mg | ORAL_TABLET | Freq: Every day | ORAL | 3 refills | Status: AC

## 2024-06-20 MED ORDER — ALPRAZOLAM 0.25 MG PO TABS: 0.2500 mg | ORAL_TABLET | Freq: Every day | ORAL | 1 refills | Status: DC

## 2024-06-20 MED ORDER — TORSEMIDE 20 MG PO TABS: 40.0000 mg | ORAL_TABLET | Freq: Every day | ORAL | 3 refills | Status: AC

## 2024-06-20 MED ORDER — OMEPRAZOLE 40 MG PO CPDR: 40.0000 mg | DELAYED_RELEASE_CAPSULE | Freq: Every day | ORAL | 3 refills | Status: AC

## 2024-06-20 NOTE — Progress Notes (Signed)
 Subjective:    Patient ID: Crystal Little, female    DOB: [birth year], 83 y.o.   MRN: [id number]  HPI Here to follow up on issues. She still has chronic pain in both knees, and she uses either a walker or awheelchair to get around. She still drives her car. She has had swelling in both legs for years, and she has seen several vascular specialists both here and at [facility] about this. They agree she has a combination of lymphedema and venous insufficiency. Her obesity plays a role in this, and she is interested in trying a GLP-1 product to help her. She denies any SOB.  She sleeps well using Xanax . Her GERD is well controlled.    Review of Systems  Constitutional: Negative.   HENT: Negative.    Eyes: Negative.   Respiratory: Negative.    Cardiovascular: Negative.   Gastrointestinal: Negative.   Genitourinary:  Negative for decreased urine volume, difficulty urinating, dyspareunia, dysuria, enuresis, flank pain, frequency, hematuria, pelvic pain and urgency.  Musculoskeletal:  Positive for arthralgias.  Skin: Negative.   Neurological: Negative.  Negative for headaches.  Psychiatric/Behavioral: Negative.         Objective:   Physical Exam Constitutional:      General: She is not in acute distress.    Appearance: She is well-developed. She is obese.     Comments: In a wheelchair   HENT:     Head: Normocephalic and atraumatic.     Right Ear: External ear normal.     Left Ear: External ear normal.     Nose: Nose normal.     Mouth/Throat:     Pharynx: No oropharyngeal exudate.  Eyes:     General: No scleral icterus.    Conjunctiva/sclera: Conjunctivae normal.     Pupils: Pupils are equal, round, and reactive to light.  Neck:     Thyroid : No thyromegaly.     Vascular: No JVD.  Cardiovascular:     Rate and Rhythm: Normal rate and regular rhythm.     Pulses: Normal pulses.     Heart sounds: Normal heart sounds. No murmur heard.    No friction rub. No gallop.  Pulmonary:      Effort: Pulmonary effort is normal. No respiratory distress.     Breath sounds: Normal breath sounds. No wheezing or rales.  Chest:     Chest wall: No tenderness.  Abdominal:     General: Bowel sounds are normal. There is no distension.     Palpations: Abdomen is soft. There is no mass.     Tenderness: There is no abdominal tenderness. There is no guarding or rebound.  Musculoskeletal:        General: Normal range of motion.     Cervical back: Normal range of motion and neck supple.     Comments: Both legs have 4+ edema, the skin is tight and tender   Lymphadenopathy:     Cervical: No cervical adenopathy.  Skin:    General: Skin is warm and dry.     Findings: No erythema or rash.  Neurological:     General: No focal deficit present.     Mental Status: She is alert and oriented to person, place, and time.     Cranial Nerves: No cranial nerve deficit.     Motor: No abnormal muscle tone.     Coordination: Coordination normal.     Deep Tendon Reflexes: Reflexes are normal and symmetric. Reflexes normal.  Psychiatric:  Mood and Affect: Mood normal.        Behavior: Behavior normal.        Thought Content: Thought content normal.        Judgment: Judgment normal.           Assessment & Plan:  She has lymphedema and venous insufficiency, and she will continue to take Torsemide  daily. She has compression stockings at home she can wear. We agree that losing weight would help this issue, since obesity is a factor. She will check with her insurance company to see if they will cover one of these medications. Her GERD is stable. She will follow up with Dr. Melodi for the knee pain. Get labs to check CBC, iron , etc, for the iron  deficiency anemia. We spent a total of ( 34  ) minutes reviewing records and discussing these issues.  Garnette Olmsted, MD

## 2024-06-20 NOTE — Addendum Note (Signed)
 Addended by: LADONNA INOCENTE SAILOR on: 06/20/2024 04:31 PM   Modules accepted: Orders

## 2024-06-20 NOTE — Addendum Note (Signed)
 Addended by: JOHNNY SENIOR A on: 06/20/2024 02:05 PM   Modules accepted: Orders

## 2024-06-21 ENCOUNTER — Ambulatory Visit: Payer: Self-pay | Admitting: Family Medicine

## 2024-06-26 ENCOUNTER — Encounter: Payer: Self-pay | Admitting: Family Medicine

## 2024-06-28 MED ORDER — OZEMPIC (0.25 OR 0.5 MG/DOSE) 2 MG/3ML ~~LOC~~ SOPN: 0.2500 mg | PEN_INJECTOR | SUBCUTANEOUS | 2 refills | Status: DC

## 2024-06-28 NOTE — Telephone Encounter (Signed)
 Tell her to try this for 4 weeks and then report back

## 2024-07-02 ENCOUNTER — Telehealth: Payer: Self-pay

## 2024-07-02 ENCOUNTER — Other Ambulatory Visit (HOSPITAL_COMMUNITY): Payer: Self-pay

## 2024-07-02 NOTE — Telephone Encounter (Signed)
FYI.  Pt notified

## 2024-07-02 NOTE — Telephone Encounter (Signed)
 Highest A1C 6.1  Ozempic Brena is approved exclusively as an adjunct to diet and exercise to improve glycemic  control in adults with type 2 diabetes mellitus. A review of patient's medical chart reveals no  documented diagnosis of type 2 diabetes or an A1C indicative of diabetes. Therefore, they do not  currently meet the criteria for prior authorization of this medication. If clinically appropriate, alternative  options such as Saxenda, Zepbound, or Georjean may be considered for this patient.

## 2024-07-09 ENCOUNTER — Other Ambulatory Visit (HOSPITAL_COMMUNITY): Payer: Self-pay

## 2024-07-13 DIAGNOSIS — S86812D Strain of other muscle(s) and tendon(s) at lower leg level, left leg, subsequent encounter: Secondary | ICD-10-CM | POA: Diagnosis not present

## 2024-07-13 DIAGNOSIS — M1711 Unilateral primary osteoarthritis, right knee: Secondary | ICD-10-CM | POA: Diagnosis not present

## 2024-07-19 ENCOUNTER — Other Ambulatory Visit: Payer: Self-pay

## 2024-07-24 ENCOUNTER — Encounter: Payer: Self-pay | Admitting: Family Medicine

## 2024-07-25 ENCOUNTER — Encounter: Payer: Self-pay | Admitting: Family Medicine

## 2024-07-25 DIAGNOSIS — Z1231 Encounter for screening mammogram for malignant neoplasm of breast: Secondary | ICD-10-CM | POA: Diagnosis not present

## 2024-07-26 MED ORDER — AZITHROMYCIN 250 MG PO TABS: ORAL_TABLET | ORAL | 0 refills | Status: DC

## 2024-07-26 NOTE — Telephone Encounter (Signed)
I sent in a Zpack for her  

## 2024-07-30 NOTE — Patient Instructions (Signed)
 SURGICAL WAITING ROOM VISITATION  Patients having surgery or a procedure may have no more than 2 support people in the waiting area - these visitors may rotate.    Children under the age of 47 must have an adult with them who is not the patient.  Visitors with respiratory illnesses are discouraged from visiting and should remain at home.  If the patient needs to stay at the hospital during part of their recovery, the visitor guidelines for inpatient rooms apply. Pre-op nurse will coordinate an appropriate time for 1 support person to accompany patient in pre-op.  This support person may not rotate.    Please refer to the [facility] website for the visitor guidelines for Inpatients (after your surgery is over and you are in a regular room).       Your procedure is scheduled on: 08/13/24   Report to [facility] Main Entrance    Report to admitting at 12:30 PM   Call this number if you have problems the morning of surgery [phone]   Do not eat food :After Midnight.   After Midnight you may have the following liquids until  12 noon DAY OF SURGERY  Water  Non-Citrus Juices (without pulp, NO RED-Apple, White grape, White cranberry) Black Coffee (NO MILK/CREAM OR CREAMERS, sugar ok)  Clear Tea (NO MILK/CREAM OR CREAMERS, sugar ok) regular and decaf                             Plain Jell-O (NO RED)                                           Fruit ices (not with fruit pulp, NO RED)                                     Popsicles (NO RED)                                                               Sports drinks like Gatorade (NO RED)                   The day of surgery:  Drink ONE (1) Pre-Surgery Clear Ensure  at 12 noon the morning of surgery. Drink in one sitting. Do not sip.  This drink was given to you during your hospital  pre-op appointment visit. Nothing else to drink after completing the  Pre-Surgery Clear Ensure     Oral Hygiene is also important to reduce  your risk of infection.                                    Remember - BRUSH YOUR TEETH THE MORNING OF SURGERY WITH YOUR REGULAR TOOTHPASTE  DENTURES WILL BE REMOVED PRIOR TO SURGERY PLEASE DO NOT APPLY Poly grip OR ADHESIVES!!!     Stop all vitamins and herbal supplements 7 days before surgery.   Take these medicines the morning of surgery with A SIP OF  WATER : omeprazole , eye drops, nasal spray              You may not have any metal on your body including hair pins, jewelry, and body piercing             Do not wear make-up, lotions, powders, perfumes/cologne, or deodorant  Do not wear nail polish including gel and S&S, artificial/acrylic nails, or any other type of covering on natural nails including finger and toenails. If you have artificial nails, gel coating, etc. that needs to be removed by a nail salon please have this removed prior to surgery or surgery may need to be canceled/ delayed if the surgeon/ anesthesia feels like they are unable to be safely monitored.   Do not shave  48 hours prior to surgery.             Do not bring valuables to the hospital. [facility] IS NOT             RESPONSIBLE   FOR VALUABLES.   Contacts, glasses, dentures or bridgework may not be worn into surgery.   Bring small overnight bag day of surgery.   DO NOT BRING YOUR HOME MEDICATIONS TO THE HOSPITAL. PHARMACY WILL DISPENSE MEDICATIONS LISTED ON YOUR MEDICATION LIST TO YOU DURING YOUR ADMISSION IN THE HOSPITAL!    Patients discharged on the day of surgery will not be allowed to drive home.  Someone NEEDS to stay with you for the first 24 hours after anesthesia.   Special Instructions: Bring a copy of your healthcare power of attorney and living will documents the day of surgery if you haven't scanned them before.              Please read over the following fact sheets you were given: IF YOU HAVE QUESTIONS ABOUT YOUR PRE-OP INSTRUCTIONS PLEASE CALL [phone] Crystal Little   If you received a  COVID test during your pre-op visit  it is requested that you wear a mask when out in public, stay away from anyone that may not be feeling well and notify your surgeon if you develop symptoms. If you test positive for Covid or have been in contact with anyone that has tested positive in the last 10 days please notify you surgeon.      Pre-operative 4 CHG Bath Instructions  DYNA-Hex 4 Chlorhexidine  Gluconate 4% Solution Antiseptic 4 fl. oz   You can play a key role in reducing the risk of infection after surgery. Your skin needs to be as free of germs as possible. You can reduce the number of germs on your skin by washing with CHG (chlorhexidine  gluconate) soap before surgery. CHG is an antiseptic soap that kills germs and continues to kill germs even after washing.   DO NOT use if you have an allergy to chlorhexidine /CHG or antibacterial soaps. If your skin becomes reddened or irritated, stop using the CHG and notify one of our RNs at   Please shower with the CHG soap starting 4 days before surgery using the following schedule:     Please keep in mind the following:  DO NOT shave, including legs and underarms, starting the day of your first shower.   You may shave your face at any point before/day of surgery.  Place clean sheets on your bed the day you start using CHG soap. Use a clean washcloth (not used since being washed) for each shower. DO NOT sleep with pets once you start using the CHG.  CHG  Shower Instructions:  If you choose to wash your hair and private area, wash first with your normal shampoo/soap.  After you use shampoo/soap, rinse your hair and body thoroughly to remove shampoo/soap residue.  Turn the water  OFF and apply about 3 tablespoons (45 ml) of CHG soap to a CLEAN washcloth.  Apply CHG soap ONLY FROM YOUR NECK DOWN TO YOUR TOES (washing for 3-5 minutes)  DO NOT use CHG soap on face, private areas, open wounds, or sores.  Pay special attention to the area where your  surgery is being performed.  If you are having back surgery, having someone wash your back for you may be helpful. Wait 2 minutes after CHG soap is applied, then you may rinse off the CHG soap.  Pat dry with a clean towel  Put on clean clothes/pajamas   If you choose to wear lotion, please use ONLY the CHG-compatible lotions on the back of this paper.     Additional instructions for the day of surgery: DO NOT APPLY any lotions, deodorants, cologne, or perfumes.   Put on clean/comfortable clothes.  Brush your teeth.  Ask your nurse before applying any prescription medications to the skin.   CHG Compatible Lotions   Aveeno Moisturizing lotion  Cetaphil Moisturizing Cream  Cetaphil Moisturizing Lotion  Clairol Herbal Essence Moisturizing Lotion, Dry Skin  Clairol Herbal Essence Moisturizing Lotion, Extra Dry Skin  Clairol Herbal Essence Moisturizing Lotion, Normal Skin  Curel Age Defying Therapeutic Moisturizing Lotion with Alpha Hydroxy  Curel Extreme Care Body Lotion  Curel Soothing Hands Moisturizing Hand Lotion  Curel Therapeutic Moisturizing Cream, Fragrance-Free  Curel Therapeutic Moisturizing Lotion, Fragrance-Free  Curel Therapeutic Moisturizing Lotion, Original Formula  Eucerin Daily Replenishing Lotion  Eucerin Dry Skin Therapy Plus Alpha Hydroxy Crme  Eucerin Dry Skin Therapy Plus Alpha Hydroxy Lotion  Eucerin Original Crme  Eucerin Original Lotion  Eucerin Plus Crme Eucerin Plus Lotion  Eucerin TriLipid Replenishing Lotion  Keri Anti-Bacterial Hand Lotion  Keri Deep Conditioning Original Lotion Dry Skin Formula Softly Scented  Keri Deep Conditioning Original Lotion, Fragrance Free Sensitive Skin Formula  Keri Lotion Fast Absorbing Fragrance Free Sensitive Skin Formula  Keri Lotion Fast Absorbing Softly Scented Dry Skin Formula  Keri Original Lotion  Keri Skin Renewal Lotion Keri Silky Smooth Lotion  Keri Silky Smooth Sensitive Skin Lotion  Nivea Body Creamy  Conditioning Oil  Nivea Body Extra Enriched Lotion  Nivea Body Original Lotion  Nivea Body Sheer Moisturizing Lotion Nivea Crme  Nivea Skin Firming Lotion  NutraDerm 30 Skin Lotion  NutraDerm Skin Lotion  NutraDerm Therapeutic Skin Cream  NutraDerm Therapeutic Skin Lotion  ProShield Protective Hand Cream  Incentive Spirometer  An incentive spirometer is a tool that can help keep your lungs clear and active. This tool measures how well you are filling your lungs with each breath. Taking long deep breaths may help reverse or decrease the chance of developing breathing (pulmonary) problems (especially infection) following: A long period of time when you are unable to move or be active. BEFORE THE PROCEDURE  If the spirometer includes an indicator to show your best effort, your nurse or respiratory therapist will set it to a desired goal. If possible, sit up straight or lean slightly forward. Try not to slouch. Hold the incentive spirometer in an upright position. INSTRUCTIONS FOR USE  Sit on the edge of your bed if possible, or sit up as far as you can in bed or on a chair. Hold the incentive spirometer in an  upright position. Breathe out normally. Place the mouthpiece in your mouth and seal your lips tightly around it. Breathe in slowly and as deeply as possible, raising the piston or the ball toward the top of the column. Hold your breath for 3-5 seconds or for as long as possible. Allow the piston or ball to fall to the bottom of the column. Remove the mouthpiece from your mouth and breathe out normally. Rest for a few seconds and repeat Steps 1 through 7 at least 10 times every 1-2 hours when you are awake. Take your time and take a few normal breaths between deep breaths. The spirometer may include an indicator to show your best effort. Use the indicator as a goal to work toward during each repetition. After each set of 10 deep breaths, practice coughing to be sure your lungs are  clear. If you have an incision (the cut made at the time of surgery), support your incision when coughing by placing a pillow or rolled up towels firmly against it. Once you are able to get out of bed, walk around indoors and cough well. You may stop using the incentive spirometer when instructed by your caregiver.  RISKS AND COMPLICATIONS Take your time so you do not get dizzy or light-headed. If you are in pain, you may need to take or ask for pain medication before doing incentive spirometry. It is harder to take a deep breath if you are having pain. AFTER USE Rest and breathe slowly and easily. It can be helpful to keep track of a log of your progress. Your caregiver can provide you with a simple table to help with this. If you are using the spirometer at home, follow these instructions: SEEK MEDICAL CARE IF:  You are having difficultly using the spirometer. You have trouble using the spirometer as often as instructed. Your pain medication is not giving enough relief while using the spirometer. You develop fever of 100.5 F (38.1 C) or higher. SEEK IMMEDIATE MEDICAL CARE IF:  You cough up bloody sputum that had not been present before. You develop fever of 102 F (38.9 C) or greater. You develop worsening pain at or near the incision site. MAKE SURE YOU:  Understand these instructions. Will watch your condition. Will get help right away if you are not doing well or get worse. Document Released: 01/31/2007 Document Revised: 12/13/2011 Document Reviewed: 04/03/2007 [facility] Patient Information 2014 [place], [place].

## 2024-07-30 NOTE — Progress Notes (Signed)
 COVID Vaccine received:  []  No [x]  Yes Date of any COVID positive Test in last 90 days: no PCP - Dr. Garnette Olmsted Cardiologist - n/a  Chest x-ray -  EKG -   Stress Test -  ECHO - 09/11/19 Epic Cardiac Cath -   Bowel Prep - [x]  No  []   Yes ______  Pacemaker / ICD device [x]  No []  Yes   Spinal Cord Stimulator:[x]  No []  Yes       History of Sleep Apnea? [x]  No []  Yes   CPAP used?- [x]  No []  Yes    Does the patient monitor blood sugar?          [x]  No []  Yes  []  N/A  Patient has: [x]  NO Hx DM   []  Pre-DM                 []  DM1  []   DM2 Does patient have a [employer] or Dexacom? []  No []  Yes   Fasting Blood Sugar Ranges-  Checks Blood Sugar _____ times a day  GLP1 agonist / usual dose - no GLP1 instructions:  SGLT-2 inhibitors / usual dose - no SGLT-2 instructions:   Blood Thinner / Instructions:no Aspirin  Instructions:no  Comments:   Activity level: Patient is  unable to climb a flight of stairs without difficulty; [x]  No CP  [x]  No SOB, but would have _knee pain__   Patient can  perform ADLs without assistance.   Anesthesia review:   Patient denies shortness of breath, fever, cough and chest pain at PAT appointment.  Patient verbalized understanding and agreement to the Pre-Surgical Instructions that were given to them at this PAT appointment. Patient was also educated of the need to review these PAT instructions again prior to his/her surgery.I reviewed the appropriate phone numbers to call if they have any and questions or concerns.

## 2024-07-31 ENCOUNTER — Encounter (HOSPITAL_COMMUNITY): Payer: Self-pay

## 2024-07-31 ENCOUNTER — Other Ambulatory Visit: Payer: Self-pay

## 2024-07-31 ENCOUNTER — Encounter (HOSPITAL_COMMUNITY)
Admission: RE | Admit: 2024-07-31 | Discharge: 2024-07-31 | Disposition: A | Discharge status: Home / Self Care | Source: Ambulatory Visit | Attending: Orthopedic Surgery | Admitting: Orthopedic Surgery

## 2024-07-31 VITALS — BP 137/59 | HR 74 | Temp 97.5°F | Resp 16 | Ht 63.0 in | Wt 200.0 lb

## 2024-07-31 DIAGNOSIS — D509 Iron deficiency anemia, unspecified: Secondary | ICD-10-CM | POA: Diagnosis not present

## 2024-07-31 DIAGNOSIS — Z01818 Encounter for other preprocedural examination: Secondary | ICD-10-CM

## 2024-07-31 DIAGNOSIS — Z01812 Encounter for preprocedural laboratory examination: Secondary | ICD-10-CM | POA: Insufficient documentation

## 2024-07-31 HISTORY — DX: Personal history of other diseases of the digestive system: Z87.19

## 2024-07-31 LAB — CBC
HCT: 39.6 % (ref 36.0–46.0)
Hemoglobin: 12.1 g/dL (ref 12.0–15.0)
MCH: 28.3 pg (ref 26.0–34.0)
MCHC: 30.6 g/dL (ref 30.0–36.0)
MCV: 92.7 fL (ref 80.0–100.0)
Platelets: 290 K/uL (ref 150–400)
RBC: 4.27 MIL/uL (ref 3.87–5.11)
RDW: 14.5 % (ref 11.5–15.5)
WBC: 8 K/uL (ref 4.0–10.5)
nRBC: 0 % (ref 0.0–0.2)

## 2024-07-31 LAB — BASIC METABOLIC PANEL WITH GFR
Anion gap: 5 (ref 5–15)
BUN: 17 mg/dL (ref 8–23)
CO2: 30 mmol/L (ref 22–32)
Calcium: 9.8 mg/dL (ref 8.9–10.3)
Chloride: 103 mmol/L (ref 98–111)
Creatinine, Ser: 0.76 mg/dL (ref 0.44–1.00)
GFR, Estimated: >60 mL/min (ref >60)
Glucose, Bld: 123 mg/dL — ABNORMAL HIGH (ref 70–99)
Potassium: 4.5 mmol/L (ref 3.5–5.1)
Sodium: 138 mmol/L (ref 135–145)

## 2024-07-31 LAB — SURGICAL PCR SCREEN
MRSA, PCR: NEGATIVE
Staphylococcus aureus: NEGATIVE

## 2024-08-03 NOTE — H&P (Signed)
 TOTAL KNEE ADMISSION H&P  Patient is being admitted for right total knee arthroplasty.  Subjective:  Chief Complaint: Right knee pain.  HPI: Crystal Little, 83 y.o. female has a history of pain and functional disability in the right knee due to arthritis and has failed non-surgical conservative treatments for greater than 12 weeks to include NSAID's and/or analgesics, corticosteriod injections, use of assistive devices, and activity modification. Onset of symptoms was gradual, starting several years ago with gradually worsening course since that time. The patient noted no past surgery on the right knee.  Patient currently rates pain in the right knee at 8 out of 10 with activity. Patient has night pain, worsening of pain with activity and weight bearing, pain with passive range of motion, and crepitus. Patient has evidence of advanced bone-on-bone arthritis in the lateral compartment with valgus deformity by imaging studies. There is no active infection.  Patient Active Problem List   Diagnosis Date Noted   Iron  deficiency anemia 06/20/2024   Obesity (BMI 30.0-34.9) 06/20/2024   Chronic venous insufficiency 03/20/2024   Lymphedema 03/20/2024   Peri-prosthetic patellar fracture 10/19/2023   Patellar tendon rupture, left, initial encounter 10/19/2023   Nocturnal leg cramps 06/20/2023   Leg swelling 11/09/2021   Sciatica, right side 05/17/2017   Special screening for malignant neoplasms, colon 04/26/2011   Diverticulosis of colon 04/26/2011   Dysphagia, pharyngoesophageal phase 01/08/2010   Enthesopathy of ankle and tarsus 12/24/2008   PLANTAR FASCIITIS, LEFT 12/24/2008   OA (osteoarthritis) of knee 05/09/2007   DEPENDENT EDEMA 05/09/2007   Personal history presenting hazards to health 05/09/2007   GERD 05/02/2007    Past Medical History:  Diagnosis Date   Anemia    Anxiety    Diverticulosis    Frequent UTI    sees Dr. Nicholaus   GERD (gastroesophageal reflux disease)    History  of hiatal hernia    IBS (irritable bowel syndrome)    Leg edema    Osteoarthritis    Peripheral vascular disease    Status post dilation of esophageal narrowing    Varicose veins of legs    sees [facility]     Past Surgical History:  Procedure Laterality Date   BACK SURGERY  10/2021   lumbar 4-5   COLONOSCOPY  04/26/2011   per Dr. Jakie, diverticulosis only, no repeats needed   endovascular repair of bilateral leg varicosities     per Dr. Audrey    ESOPHAGOGASTRODUODENOSCOPY  01/19/2010   per Dr. Jakie, with dilatation    ESOPHAGOGASTRODUODENOSCOPY (EGD) WITH ESOPHAGEAL DILATION     PATELLAR TENDON REPAIR Left 10/19/2023   Procedure: PATELLAR TENDON REPAIR;  Surgeon: Melodi Lerner, MD;  Location: [facility];  Service: Orthopedics;  Laterality: Left;   right knee arthroscopy  12/17/05 and 01-06-12   per Dr. Beverley   TONSILLECTOMY     TOOTH EXTRACTION Right 09/20/2023   Lower right side with bone implant   TOTAL KNEE ARTHROPLASTY Left 04/18/2023   Procedure: TOTAL KNEE ARTHROPLASTY;  Surgeon: Melodi Lerner, MD;  Location: [facility];  Service: Orthopedics;  Laterality: Left;    Prior to Admission medications   Medication Sig Start Date End Date Taking? Authorizing Provider  ALPRAZolam  (XANAX ) 0.25 MG tablet Take 1 tablet (0.25 mg total) by mouth at bedtime. Patient taking differently: Take 0.25 mg by mouth at bedtime as needed for anxiety or sleep. 06/20/24  Yes Johnny Garnette LABOR, MD  azithromycin  (ZITHROMAX  Z-PAK) 250 MG tablet As directed 07/26/24  Yes  Johnny Garnette LABOR, MD  Calcium-Vitamin D -Vitamin K (VIACTIV CALCIUM PLUS D PO) Take 1 tablet by mouth daily.   Yes [provider]  Carboxymethylcellulose Sodium (THERATEARS OP) Place 1 drop into both eyes 3 (three) times daily as needed (dry/irritated eyes.).   Yes [provider]  cholecalciferol (VITAMIN D3) 25 MCG (1000 UNIT) tablet Take 1,000 Units by mouth daily.   Yes [provider]   fluticasone (FLONASE) 50 MCG/ACT nasal spray Place 1 spray into both nostrils daily as needed (nasal congestion.).   Yes [provider]  Hydrocortisone 2 % CREA Apply 1 Application topically daily as needed (leg itching).   Yes [provider]  ibuprofen (ADVIL) 200 MG tablet Take 400 mg by mouth every 8 (eight) hours as needed (pain.).   Yes [provider]  Iron , Ferrous Sulfate , 325 (65 Fe) MG TABS Take 325 mg by mouth daily. 06/20/24  Yes Johnny Garnette LABOR, MD  omeprazole  (PRILOSEC) 40 MG capsule Take 1 capsule (40 mg total) by mouth daily. 06/20/24  Yes Johnny Garnette LABOR, MD  torsemide  (DEMADEX ) 20 MG tablet Take 2 tablets (40 mg total) by mouth daily. 06/20/24  Yes Johnny Garnette LABOR, MD    Allergies  Allergen Reactions   Amoxicillin      Unknown reaction     Social History   Socioeconomic History   Marital status: Single    Spouse name: Not on file   Number of children: 3   Years of education: Not on file   Highest education level: Not on file  Occupational History   Occupation: [employer]  Tobacco Use   Smoking status: Never   Smokeless tobacco: Never  Vaping Use   Vaping status: Never Used  Substance and Sexual Activity   Alcohol use: Yes    Alcohol/week: 2.0 standard drinks of alcohol    Types: 2 Glasses of wine per week    Comment: occas   Drug use: No   Sexual activity: Not Currently  Other Topics Concern   Not on file  Social History Narrative   Not on file   Social Drivers of Health   Financial Resource Strain: Low Risk  (06/13/2023)   Overall Financial Resource Strain (CARDIA)    Difficulty of Paying Living Expenses: Not hard at all  Food Insecurity: No Food Insecurity (10/19/2023)   Hunger Vital Sign    Worried About Running Out of Food in the Last Year: Never true    Ran Out of Food in the Last Year: Never true  Transportation Needs: No Transportation Needs (10/19/2023)   PRAPARE - Administrator, Civil Service (Medical):  No    Lack of Transportation (Non-Medical): No  Physical Activity: Insufficiently Active (06/13/2023)   Exercise Vital Sign    Days of Exercise per Week: 2 days    Minutes of Exercise per Session: 40 min  Stress: No Stress Concern Present (06/13/2023)   [employer] of Occupational Health - Occupational Stress Questionnaire    Feeling of Stress : Not at all  Social Connections: Moderately Integrated (10/19/2023)   Social Connection and Isolation Panel    Frequency of Communication with Friends and Family: More than three times a week    Frequency of Social Gatherings with Friends and Family: More than three times a week    Attends Religious Services: More than 4 times per year    Active Member of [employer] or Organizations: Yes    Attends Banker Meetings: More than  4 times per year    Marital Status: Widowed  Intimate Partner Violence: Not At Risk (10/19/2023)   Humiliation, Afraid, Rape, and Kick questionnaire    Fear of Current or Ex-Partner: No    Emotionally Abused: No    Physically Abused: No    Sexually Abused: No    Tobacco Use: Low Risk  (06/20/2024)   Patient History    Smoking Tobacco Use: Never    Smokeless Tobacco Use: Never    Passive Exposure: Not on file   Social History   Substance and Sexual Activity  Alcohol Use Yes   Alcohol/week: 2.0 standard drinks of alcohol   Types: 2 Glasses of wine per week   Comment: occas    Family History  Problem Relation Age of Onset   Irritable bowel syndrome Mother    Pancreatic cancer Paternal Grandmother    Diabetes Maternal Aunt    Diabetes Maternal Uncle    Heart disease Paternal Grandfather    Colon cancer Neg Hx    Colon polyps Neg Hx    Kidney disease Neg Hx     Review of Systems  Constitutional:  Negative for chills and fever.  HENT:  Negative for congestion, sore throat and tinnitus.   Eyes:  Negative for double vision, photophobia and pain.  Respiratory:  Negative for cough, shortness of breath  and wheezing.   Cardiovascular:  Negative for chest pain, palpitations and orthopnea.  Gastrointestinal:  Negative for heartburn, nausea and vomiting.  Genitourinary:  Negative for dysuria, frequency and urgency.  Musculoskeletal:  Positive for joint pain.  Neurological:  Negative for dizziness, weakness and headaches.    Objective:  Physical Exam: Well nourished and well developed.  General: Alert and oriented x3, cooperative and pleasant, no acute distress.  Head: normocephalic, atraumatic, neck supple.  Eyes: EOMI.  Musculoskeletal:   Right Knee: Significant valgus deformity. Range of motion from 5 to 100 degrees. Marked crepitus on range of motion. Diffuse tenderness. No instability.  Calves soft and nontender. Motor function intact in LE. Strength 5/5 LE bilaterally. Neuro: Distal pulses 2+. Sensation to light touch intact in LE.  Imaging Review Plain radiographs demonstrate severe degenerative joint disease of the right knee. The overall alignment is significant valgus. The bone quality appears to be adequate for age and reported activity level.  Assessment/Plan:  End stage arthritis, right knee   The patient history, physical examination, clinical judgment of the provider and imaging studies are consistent with end stage degenerative joint disease of the right knee and total knee arthroplasty is deemed medically necessary. The treatment options including medical management, injection therapy arthroscopy and arthroplasty were discussed at length. The risks and benefits of total knee arthroplasty were presented and reviewed. The risks due to aseptic loosening, infection, stiffness, patella tracking problems, thromboembolic complications and other imponderables were discussed. The patient acknowledged the explanation, agreed to proceed with the plan and consent was signed. Patient is being admitted for inpatient treatment for surgery, pain control, PT, OT, prophylactic antibiotics,  VTE prophylaxis, progressive ambulation and ADLs and discharge planning. The patient is planning to be discharged home.  Anticipated LOS equal to or greater than 2 midnights due to - Age 34 and older with one or more of the following:  - Obesity  - Expected need for hospital services (PT, OT, Nursing) required for safe  discharge  - Anticipated need for postoperative skilled nursing care or inpatient rehab  - Active co-morbidities: None OR   - Unanticipated findings during/Post  Surgery: None  - Patient is a high risk of re-admission due to: None  Therapy Plans: SNF -- wants [facility] Disposition: SNF Planned DVT Prophylaxis: Aspirin  81 mg BID DME Needed: None PCP: Garnette Olmsted, MD (clearance received) TXA: IV Allergies: Amoxcillin (severe diarrhea) Metal Allergy: None Anesthesia Concerns: None BMI: 35.1 Last HgbA1c: 6.1% (06/2024) Pain Regimen: Oxycodone , tramadol  Pharmacy: Printed scripts  Other: - Had oxycodone  and flexeril  with previous TKA, which did well. Methocarbamol  not as effective.    - Patient was instructed on what medications to stop prior to surgery. - Follow-up visit in 2 weeks with Dr. Melodi - Begin physical therapy following surgery - Pre-operative lab work as pre-surgical testing - Prescriptions will be provided in hospital at time of discharge  Roxie Mess, PA-C Orthopedic Surgery [facility] [place]

## 2024-08-13 ENCOUNTER — Observation Stay (HOSPITAL_COMMUNITY)
Admission: RE | Admit: 2024-08-13 | Discharge: 2024-08-16 | Disposition: A | Discharge status: Skilled Nursing Facility | Attending: Orthopedic Surgery | Admitting: Orthopedic Surgery

## 2024-08-13 ENCOUNTER — Encounter (HOSPITAL_COMMUNITY)
Admission: RE | Disposition: A | Discharge status: Skilled Nursing Facility | Payer: Self-pay | Source: Home / Self Care | Attending: Orthopedic Surgery

## 2024-08-13 ENCOUNTER — Encounter (HOSPITAL_COMMUNITY): Payer: Self-pay | Admitting: Orthopedic Surgery

## 2024-08-13 ENCOUNTER — Ambulatory Visit (HOSPITAL_COMMUNITY): Admitting: Certified Registered Nurse Anesthetist

## 2024-08-13 ENCOUNTER — Other Ambulatory Visit: Payer: Self-pay

## 2024-08-13 DIAGNOSIS — M1711 Unilateral primary osteoarthritis, right knee: Secondary | ICD-10-CM

## 2024-08-13 DIAGNOSIS — F419 Anxiety disorder, unspecified: Secondary | ICD-10-CM

## 2024-08-13 DIAGNOSIS — Z96652 Presence of left artificial knee joint: Secondary | ICD-10-CM | POA: Diagnosis not present

## 2024-08-13 DIAGNOSIS — M179 Osteoarthritis of knee, unspecified: Principal | ICD-10-CM | POA: Diagnosis present

## 2024-08-13 SURGERY — ARTHROPLASTY, KNEE, TOTAL
Anesthesia: Spinal | Site: Knee | Laterality: Right

## 2024-08-13 MED ORDER — CHLORHEXIDINE GLUCONATE 0.12 % MT SOLN
15.0000 mL | Freq: Once | OROMUCOSAL | Status: AC
  Administered 2024-08-13: 15 mL via OROMUCOSAL

## 2024-08-13 MED ORDER — ONDANSETRON HCL 4 MG/2ML IJ SOLN
INTRAMUSCULAR | Status: AC
  Filled 2024-08-13: qty 2

## 2024-08-13 MED ORDER — DOCUSATE SODIUM 100 MG PO CAPS
100.0000 mg | ORAL_CAPSULE | Freq: Two times a day (BID) | ORAL | Status: DC
  Administered 2024-08-13 – 2024-08-16 (×6): 100 mg via ORAL
  Filled 2024-08-13 (×6): qty 1

## 2024-08-13 MED ORDER — HYDROMORPHONE HCL 1 MG/ML IJ SOLN
0.5000 mg | INTRAMUSCULAR | Status: DC | PRN
  Administered 2024-08-13: 0.5 mg via INTRAVENOUS
  Administered 2024-08-14: 1 mg via INTRAVENOUS
  Filled 2024-08-13 (×2): qty 1

## 2024-08-13 MED ORDER — SODIUM CHLORIDE 0.9 % IV SOLN: INTRAVENOUS | Status: DC

## 2024-08-13 MED ORDER — SODIUM CHLORIDE (PF) 0.9 % IJ SOLN
INTRAMUSCULAR | Status: DC | PRN
  Administered 2024-08-13: 80 mL

## 2024-08-13 MED ORDER — TRANEXAMIC ACID-NACL 1000-0.7 MG/100ML-% IV SOLN
1000.0000 mg | INTRAVENOUS | Status: AC
  Administered 2024-08-13: 1000 mg via INTRAVENOUS
  Filled 2024-08-13: qty 100

## 2024-08-13 MED ORDER — FERROUS SULFATE 325 (65 FE) MG PO TABS
325.0000 mg | ORAL_TABLET | Freq: Every day | ORAL | Status: DC
Start: 2024-08-14 — End: 2024-08-16
  Administered 2024-08-14 – 2024-08-16 (×3): 325 mg via ORAL
  Filled 2024-08-13 (×3): qty 1

## 2024-08-13 MED ORDER — SODIUM CHLORIDE 0.9 % IR SOLN
Status: DC | PRN
  Administered 2024-08-13: 1000 mL

## 2024-08-13 MED ORDER — DEXAMETHASONE SOD PHOSPHATE PF 10 MG/ML IJ SOLN
10.0000 mg | Freq: Once | INTRAMUSCULAR | Status: AC
  Administered 2024-08-14: 10 mg via INTRAVENOUS

## 2024-08-13 MED ORDER — BISACODYL 10 MG RE SUPP
10.0000 mg | Freq: Every day | RECTAL | Status: DC | PRN
  Administered 2024-08-16: 10 mg via RECTAL
  Filled 2024-08-13: qty 1

## 2024-08-13 MED ORDER — DEXAMETHASONE SOD PHOSPHATE PF 10 MG/ML IJ SOLN: 8.0000 mg | Freq: Once | INTRAMUSCULAR | Status: DC

## 2024-08-13 MED ORDER — BUPIVACAINE IN DEXTROSE 0.75-8.25 % IT SOLN
INTRATHECAL | Status: DC | PRN
Start: 2024-08-13 — End: 2024-08-13
  Administered 2024-08-13: 1.6 mL via INTRATHECAL

## 2024-08-13 MED ORDER — ACETAMINOPHEN 10 MG/ML IV SOLN
1000.0000 mg | Freq: Four times a day (QID) | INTRAVENOUS | Status: DC
  Administered 2024-08-13: 1000 mg via INTRAVENOUS
  Filled 2024-08-13: qty 100

## 2024-08-13 MED ORDER — FENTANYL CITRATE (PF) 50 MCG/ML IJ SOSY: 25.0000 ug | PREFILLED_SYRINGE | INTRAMUSCULAR | Status: DC | PRN

## 2024-08-13 MED ORDER — FENTANYL CITRATE (PF) 50 MCG/ML IJ SOSY
50.0000 ug | PREFILLED_SYRINGE | INTRAMUSCULAR | Status: DC
  Administered 2024-08-13: 50 ug via INTRAVENOUS
  Filled 2024-08-13: qty 2

## 2024-08-13 MED ORDER — PROPOFOL 500 MG/50ML IV EMUL
INTRAVENOUS | Status: DC | PRN
  Administered 2024-08-13: 85 ug/kg/min via INTRAVENOUS

## 2024-08-13 MED ORDER — CEFAZOLIN SODIUM-DEXTROSE 2-4 GM/100ML-% IV SOLN
2.0000 g | Freq: Four times a day (QID) | INTRAVENOUS | Status: AC
  Administered 2024-08-13 – 2024-08-14 (×2): 2 g via INTRAVENOUS
  Filled 2024-08-13 (×2): qty 100

## 2024-08-13 MED ORDER — LACTATED RINGERS IV SOLN: INTRAVENOUS | Status: DC

## 2024-08-13 MED ORDER — ASPIRIN 81 MG PO CHEW
81.0000 mg | CHEWABLE_TABLET | Freq: Two times a day (BID) | ORAL | Status: DC
  Administered 2024-08-14 – 2024-08-16 (×5): 81 mg via ORAL
  Filled 2024-08-13 (×5): qty 1

## 2024-08-13 MED ORDER — ONDANSETRON HCL 4 MG/2ML IJ SOLN
INTRAMUSCULAR | Status: DC | PRN
  Administered 2024-08-13: 4 mg via INTRAVENOUS

## 2024-08-13 MED ORDER — CEFAZOLIN SODIUM-DEXTROSE 2-4 GM/100ML-% IV SOLN
2.0000 g | INTRAVENOUS | Status: AC
  Administered 2024-08-13: 2 g via INTRAVENOUS
  Filled 2024-08-13: qty 100

## 2024-08-13 MED ORDER — ACETAMINOPHEN 325 MG PO TABS
325.0000 mg | ORAL_TABLET | Freq: Four times a day (QID) | ORAL | Status: DC | PRN
  Administered 2024-08-14: 650 mg via ORAL
  Filled 2024-08-13: qty 2

## 2024-08-13 MED ORDER — POVIDONE-IODINE 10 % EX SWAB: 2.0000 | Freq: Once | CUTANEOUS | Status: DC

## 2024-08-13 MED ORDER — ONDANSETRON HCL 4 MG/2ML IJ SOLN: 4.0000 mg | Freq: Four times a day (QID) | INTRAMUSCULAR | Status: DC | PRN

## 2024-08-13 MED ORDER — METOCLOPRAMIDE HCL 5 MG/ML IJ SOLN: 5.0000 mg | Freq: Three times a day (TID) | INTRAMUSCULAR | Status: DC | PRN

## 2024-08-13 MED ORDER — PHENOL 1.4 % MT LIQD: 1.0000 | OROMUCOSAL | Status: DC | PRN

## 2024-08-13 MED ORDER — OXYCODONE HCL 5 MG PO TABS
5.0000 mg | ORAL_TABLET | ORAL | Status: DC | PRN
  Administered 2024-08-13: 10 mg via ORAL
  Administered 2024-08-13 (×2): 5 mg via ORAL
  Administered 2024-08-14 – 2024-08-16 (×9): 10 mg via ORAL
  Filled 2024-08-13 (×2): qty 2
  Filled 2024-08-13: qty 1
  Filled 2024-08-13 (×2): qty 2
  Filled 2024-08-13: qty 1
  Filled 2024-08-13 (×7): qty 2

## 2024-08-13 MED ORDER — TRAMADOL HCL 50 MG PO TABS
50.0000 mg | ORAL_TABLET | Freq: Four times a day (QID) | ORAL | Status: DC | PRN
  Administered 2024-08-14: 50 mg via ORAL
  Administered 2024-08-15 – 2024-08-16 (×3): 100 mg via ORAL
  Filled 2024-08-13: qty 2
  Filled 2024-08-13: qty 1
  Filled 2024-08-13 (×2): qty 2

## 2024-08-13 MED ORDER — PANTOPRAZOLE SODIUM 40 MG PO TBEC
40.0000 mg | DELAYED_RELEASE_TABLET | Freq: Every day | ORAL | Status: DC
  Administered 2024-08-14 – 2024-08-16 (×3): 40 mg via ORAL
  Filled 2024-08-13 (×3): qty 1

## 2024-08-13 MED ORDER — ROPIVACAINE HCL 5 MG/ML IJ SOLN
INTRAMUSCULAR | Status: DC | PRN
  Administered 2024-08-13: 20 mL via PERINEURAL

## 2024-08-13 MED ORDER — BUPIVACAINE LIPOSOME 1.3 % IJ SUSP
INTRAMUSCULAR | Status: AC
  Filled 2024-08-13: qty 20

## 2024-08-13 MED ORDER — STERILE WATER FOR IRRIGATION IR SOLN
Status: DC | PRN
  Administered 2024-08-13: 2000 mL

## 2024-08-13 MED ORDER — DEXAMETHASONE SOD PHOSPHATE PF 10 MG/ML IJ SOLN
INTRAMUSCULAR | Status: DC | PRN
  Administered 2024-08-13: 10 mg via INTRAVENOUS

## 2024-08-13 MED ORDER — ONDANSETRON HCL 4 MG/2ML IJ SOLN: 4.0000 mg | Freq: Once | INTRAMUSCULAR | Status: DC | PRN

## 2024-08-13 MED ORDER — PROPOFOL 1000 MG/100ML IV EMUL
INTRAVENOUS | Status: AC
  Filled 2024-08-13: qty 100

## 2024-08-13 MED ORDER — FLEET ENEMA RE ENEM: 1.0000 | ENEMA | Freq: Once | RECTAL | Status: DC | PRN

## 2024-08-13 MED ORDER — CYCLOBENZAPRINE HCL 10 MG PO TABS
10.0000 mg | ORAL_TABLET | Freq: Three times a day (TID) | ORAL | Status: DC | PRN
  Administered 2024-08-13 – 2024-08-16 (×7): 10 mg via ORAL
  Filled 2024-08-13 (×8): qty 1

## 2024-08-13 MED ORDER — POLYETHYLENE GLYCOL 3350 17 G PO PACK
17.0000 g | PACK | Freq: Every day | ORAL | Status: DC | PRN
  Administered 2024-08-16: 17 g via ORAL
  Filled 2024-08-13: qty 1

## 2024-08-13 MED ORDER — SODIUM CHLORIDE (PF) 0.9 % IJ SOLN
INTRAMUSCULAR | Status: AC
  Filled 2024-08-13: qty 10

## 2024-08-13 MED ORDER — ACETAMINOPHEN 500 MG PO TABS
1000.0000 mg | ORAL_TABLET | Freq: Four times a day (QID) | ORAL | Status: AC
  Administered 2024-08-13 – 2024-08-14 (×3): 1000 mg via ORAL
  Filled 2024-08-13 (×4): qty 2

## 2024-08-13 MED ORDER — ONDANSETRON HCL 4 MG PO TABS: 4.0000 mg | ORAL_TABLET | Freq: Four times a day (QID) | ORAL | Status: DC | PRN

## 2024-08-13 MED ORDER — PROPOFOL 10 MG/ML IV BOLUS
INTRAVENOUS | Status: DC | PRN
  Administered 2024-08-13: 40 mg via INTRAVENOUS

## 2024-08-13 MED ORDER — DIPHENHYDRAMINE HCL 12.5 MG/5ML PO ELIX: 12.5000 mg | ORAL_SOLUTION | ORAL | Status: DC | PRN

## 2024-08-13 MED ORDER — SODIUM CHLORIDE (PF) 0.9 % IJ SOLN
INTRAMUSCULAR | Status: AC
  Filled 2024-08-13: qty 50

## 2024-08-13 MED ORDER — TORSEMIDE 20 MG PO TABS
40.0000 mg | ORAL_TABLET | Freq: Every day | ORAL | Status: DC
  Administered 2024-08-14: 40 mg via ORAL
  Filled 2024-08-13 (×2): qty 2

## 2024-08-13 MED ORDER — BUPIVACAINE LIPOSOME 1.3 % IJ SUSP: 20.0000 mL | Freq: Once | INTRAMUSCULAR | Status: DC

## 2024-08-13 MED ORDER — METOCLOPRAMIDE HCL 5 MG PO TABS: 5.0000 mg | ORAL_TABLET | Freq: Three times a day (TID) | ORAL | Status: DC | PRN

## 2024-08-13 MED ORDER — 0.9 % SODIUM CHLORIDE (POUR BTL) OPTIME
TOPICAL | Status: DC | PRN
  Administered 2024-08-13: 1000 mL

## 2024-08-13 MED ORDER — ORAL CARE MOUTH RINSE: 15.0000 mL | Freq: Once | OROMUCOSAL | Status: AC

## 2024-08-13 MED ORDER — ALPRAZOLAM 0.25 MG PO TABS
0.2500 mg | ORAL_TABLET | Freq: Every evening | ORAL | Status: DC | PRN
  Administered 2024-08-13 – 2024-08-14 (×2): 0.25 mg via ORAL
  Filled 2024-08-13 (×3): qty 1

## 2024-08-13 MED ORDER — MENTHOL 3 MG MT LOZG: 1.0000 | LOZENGE | OROMUCOSAL | Status: DC | PRN

## 2024-08-13 SURGICAL SUPPLY — 43 items
ATTUNE MED DOME PAT 41 KNEE (Knees) IMPLANT
ATTUNE PS FEM RT SZ 6 CEM KNEE (Femur) IMPLANT
ATTUNE PSRP INSR SZ6 8 KNEE (Insert) IMPLANT
BAG COUNTER SPONGE SURGICOUNT (BAG) IMPLANT
BAG ZIPLOCK 12X15 (MISCELLANEOUS) ×1 IMPLANT
BASE TIBIAL ROT PLAT SZ 5 KNEE (Knees) IMPLANT
BLADE SAG 18X100X1.27 (BLADE) ×1 IMPLANT
BLADE SAW SGTL 11.0X1.19X90.0M (BLADE) ×1 IMPLANT
BNDG ELASTIC 4X5.8 VLCR NS LF (GAUZE/BANDAGES/DRESSINGS) IMPLANT
BNDG ELASTIC 6INX 5YD STR LF (GAUZE/BANDAGES/DRESSINGS) ×1 IMPLANT
BOWL SMART MIX CTS (DISPOSABLE) ×1 IMPLANT
CEMENT HV SMART SET (Cement) ×2 IMPLANT
COVER SURGICAL LIGHT HANDLE (MISCELLANEOUS) ×1 IMPLANT
CUFF TRNQT CYL 34X4.125X (TOURNIQUET CUFF) ×1 IMPLANT
DERMABOND ADVANCED .7 DNX12 (GAUZE/BANDAGES/DRESSINGS) ×1 IMPLANT
DRAPE U-SHAPE 47X51 STRL (DRAPES) ×1 IMPLANT
DRESSING AQUACEL AG SP 3.5X10 (GAUZE/BANDAGES/DRESSINGS) IMPLANT
DRSG AQUACEL AG ADV 3.5X10 (GAUZE/BANDAGES/DRESSINGS) ×1 IMPLANT
DURAPREP 26ML APPLICATOR (WOUND CARE) ×1 IMPLANT
ELECT REM PT RETURN 15FT ADLT (MISCELLANEOUS) ×1 IMPLANT
GLOVE BIO SURGEON STRL SZ 6.5 (GLOVE) IMPLANT
GLOVE BIO SURGEON STRL SZ8 (GLOVE) ×1 IMPLANT
GLOVE BIOGEL PI IND STRL 7.0 (GLOVE) ×1 IMPLANT
GLOVE BIOGEL PI IND STRL 8 (GLOVE) ×1 IMPLANT
GOWN STRL REUS W/ TWL LRG LVL3 (GOWN DISPOSABLE) ×1 IMPLANT
HOLDER FOLEY CATH W/STRAP (MISCELLANEOUS) ×1 IMPLANT
IMMOBILIZER KNEE 20 THIGH 36 (SOFTGOODS) ×1 IMPLANT
KIT TURNOVER KIT A (KITS) ×1 IMPLANT
MANIFOLD NEPTUNE II (INSTRUMENTS) ×1 IMPLANT
NS IRRIG 1000ML POUR BTL (IV SOLUTION) ×1 IMPLANT
PACK TOTAL KNEE CUSTOM (KITS) ×1 IMPLANT
PADDING CAST COTTON 6X4 STRL (CAST SUPPLIES) ×2 IMPLANT
PENCIL SMOKE EVACUATOR (MISCELLANEOUS) ×1 IMPLANT
PIN STEINMAN FIXATION KNEE (PIN) IMPLANT
PROTECTOR NERVE ULNAR (MISCELLANEOUS) ×1 IMPLANT
SET HNDPC FAN SPRY TIP SCT (DISPOSABLE) ×1 IMPLANT
SUT MNCRL AB 4-0 PS2 18 (SUTURE) ×1 IMPLANT
SUT VIC AB 2-0 CT1 TAPERPNT 27 (SUTURE) ×3 IMPLANT
SUTURE STRATFX 0 PDS 27 VIOLET (SUTURE) ×1 IMPLANT
TOWEL GREEN STERILE FF (TOWEL DISPOSABLE) ×1 IMPLANT
TRAY FOLEY MTR SLVR 16FR STAT (SET/KITS/TRAYS/PACK) ×1 IMPLANT
TUBE SUCTION HIGH CAP CLEAR NV (SUCTIONS) ×1 IMPLANT
WRAP KNEE MAXI GEL POST OP (GAUZE/BANDAGES/DRESSINGS) ×1 IMPLANT

## 2024-08-13 NOTE — Anesthesia Preprocedure Evaluation (Addendum)
 Anesthesia Evaluation  Patient identified by MRN, date of birth, ID band Patient awake    Reviewed: Allergy & Precautions, NPO status , Patient's Chart, lab work & pertinent test results  Airway Mallampati: II  TM Distance: >3 FB Neck ROM: Full    Dental  (+) Teeth Intact, Dental Advisory Given   Pulmonary neg pulmonary ROS   Pulmonary exam normal breath sounds clear to auscultation       Cardiovascular + Peripheral Vascular Disease  Normal cardiovascular exam Rhythm:Regular Rate:Normal     Neuro/Psych  PSYCHIATRIC DISORDERS Anxiety     S/p L4-5 fusion  Neuromuscular disease    GI/Hepatic Neg liver ROS, hiatal hernia,GERD  Medicated,,  Endo/Other  Obesity   Renal/GU negative Renal ROS     Musculoskeletal  (+) Arthritis ,    Abdominal   Peds  Hematology negative hematology ROS (+) Plt 290k   Anesthesia Other Findings Day of surgery medications reviewed with the patient.  Reproductive/Obstetrics                              Anesthesia Physical Anesthesia Plan  ASA: 3  Anesthesia Plan: Spinal   Post-op Pain Management: Regional block* and Tylenol  PO (pre-op)*   Induction: Intravenous  PONV Risk Score and Plan: 2 and TIVA, Dexamethasone  and Ondansetron   Airway Management Planned: Simple Face Mask and Natural Airway  Additional Equipment:   Intra-op Plan:   Post-operative Plan:   Informed Consent: I have reviewed the patients History and Physical, chart, labs and discussed the procedure including the risks, benefits and alternatives for the proposed anesthesia with the patient or authorized representative who has indicated his/her understanding and acceptance.     Dental advisory given  Plan Discussed with: CRNA  Anesthesia Plan Comments:          Anesthesia Quick Evaluation

## 2024-08-13 NOTE — Anesthesia Procedure Notes (Signed)
 Spinal  Patient location during procedure: OR Start time: 08/13/2024 12:38 PM End time: 08/13/2024 12:40 PM Reason for block: surgical anesthesia Staffing Performed: resident/CRNA  Resident/CRNA: Vanassa Penniman C, CRNA Performed by: Ondra Deboard C, CRNA Authorized by: Corinne Garnette BRAVO, MD   Preanesthetic Checklist Completed: patient identified, IV checked, site marked, risks and benefits discussed, surgical consent, monitors and equipment checked, pre-op evaluation and timeout performed Spinal Block Patient position: sitting Prep: DuraPrep and site prepped and draped Patient monitoring: heart rate, cardiac monitor, continuous pulse ox and blood pressure Approach: midline Location: L3-4 Injection technique: single-shot Needle Needle type: Pencan  Needle gauge: 24 G Needle length: 9 cm Assessment Sensory level: T4 Events: CSF return Additional Notes IV functioning, monitors applied to pt. Expiration date of kit checked and confirmed to be in date. Sterile prep and drape, hand hygiene and sterile gloved used. Pt was positioned and spine was prepped in sterile fashion. Skin was anesthetized with lidocaine . Free flow of clear CSF obtained prior to injecting local anesthetic into CSF x 1 attempt. Spinal needle aspirated freely following injection. Needle was carefully withdrawn, and pt tolerated procedure well. Loss of motor and sensory on exam post injection.

## 2024-08-13 NOTE — Discharge Instructions (Signed)
Crystal Arabian, MD Total Joint Specialist [facility] [address], [address] [place], [place] [address] [phone]  TOTAL KNEE REPLACEMENT POSTOPERATIVE DIRECTIONS    Knee Rehabilitation, Guidelines Following Surgery  Results after knee surgery are often greatly improved when you follow the exercise, range of motion and muscle strengthening exercises prescribed by your doctor. Safety measures are also important to protect the knee from further injury. If any of these exercises cause you to have increased pain or swelling in your knee joint, decrease the amount until you are comfortable again and slowly increase them. If you have problems or questions, call your caregiver or physical therapist for advice.   BLOOD CLOT PREVENTION Take an 81 mg Aspirin two times a day for three weeks following surgery. Then take an 81 mg Aspirin once a day for three weeks. Then discontinue Aspirin. You may resume your vitamins/supplements upon discharge from the hospital. Do not take any NSAIDs (Advil, Aleve, Ibuprofen, Meloxicam, etc.) until you are 3 weeks out from surgery.  HOME CARE INSTRUCTIONS  Remove items at home which could result in a fall. This includes throw rugs or furniture in walking pathways.  ICE to the affected knee as much as tolerated. Icing helps control swelling. If the swelling is well controlled you will be more comfortable and rehab easier. Continue to use ice on the knee for pain and swelling from surgery. You may notice swelling that will progress down to the foot and ankle. This is normal after surgery. Elevate the leg when you are not up walking on it.    Continue to use the breathing machine which will help keep your temperature down. It is common for your temperature to cycle up and down following surgery, especially at night when you are not up moving around and exerting yourself. The breathing machine keeps your lungs expanded and your temperature down. Do  not place pillow under the operative knee, focus on keeping the knee straight while resting  DIET You may resume your previous home diet once you are discharged from the hospital.  DRESSING / WOUND CARE / SHOWERING Keep your bulky bandage on for 2 days. On the third post-operative day you may remove the Ace bandage and gauze. There is a waterproof adhesive bandage on your skin which will stay in place until your first follow-up appointment. Once you remove this you will not need to place another bandage You may begin showering 3 days following surgery, but do not submerge the incision under water.  ACTIVITY For the first 5 days, the key is rest and control of pain and swelling Do your home exercises twice a day starting on post-operative day 3. On the days you go to physical therapy, just do the home exercises once that day. You should rest, ice and elevate the leg for 50 minutes out of every hour. Get up and walk/stretch for 10 minutes per hour. After 5 days you can increase your activity slowly as tolerated. Walk with your walker as instructed. Use the walker until you are comfortable transitioning to a cane. Walk with the cane in the opposite hand of the operative leg. You may discontinue the cane once you are comfortable and walking steadily. Avoid periods of inactivity such as sitting longer than an hour when not asleep. This helps prevent blood clots.  You may discontinue the knee immobilizer once you are able to perform a straight leg raise while lying down. You may resume a sexual relationship in one month  or when given the OK by your doctor.  You may return to work once you are cleared by your doctor.  Do not drive a car for 6 weeks or until released by your surgeon.  Do not drive while taking narcotics.  TED HOSE STOCKINGS Wear the elastic stockings on both legs for three weeks following surgery during the day. You may remove them at night for sleeping.  WEIGHT BEARING Weight  bearing as tolerated with assist device (walker, cane, etc) as directed, use it as long as suggested by your surgeon or therapist, typically at least 4-6 weeks.  POSTOPERATIVE CONSTIPATION PROTOCOL Constipation - defined medically as fewer than three stools per week and severe constipation as less than one stool per week.  One of the most common issues patients have following surgery is constipation.  Even if you have a regular bowel pattern at home, your normal regimen is likely to be disrupted due to multiple reasons following surgery.  Combination of anesthesia, postoperative narcotics, change in appetite and fluid intake all can affect your bowels.  In order to avoid complications following surgery, here are some recommendations in order to help you during your recovery period.  Colace (docusate) - Pick up an over-the-counter form of Colace or another stool softener and take twice a day as long as you are requiring postoperative pain medications.  Take with a full glass of water daily.  If you experience loose stools or diarrhea, hold the colace until you stool forms back up. If your symptoms do not get better within 1 week or if they get worse, check with your doctor. Dulcolax (bisacodyl) - Pick up over-the-counter and take as directed by the product packaging as needed to assist with the movement of your bowels.  Take with a full glass of water.  Use this product as needed if not relieved by Colace only.  MiraLax (polyethylene glycol) - Pick up over-the-counter to have on hand. MiraLax is a solution that will increase the amount of water in your bowels to assist with bowel movements.  Take as directed and can mix with a glass of water, juice, soda, coffee, or tea. Take if you go more than two days without a movement. Do not use MiraLax more than once per day. Call your doctor if you are still constipated or irregular after using this medication for 7 days in a row.  If you continue to have problems  with postoperative constipation, please contact the office for further assistance and recommendations.  If you experience "the worst abdominal pain ever" or develop nausea or vomiting, please contact the office immediatly for further recommendations for treatment.  ITCHING If you experience itching with your medications, try taking only a single pain pill, or even half a pain pill at a time.  You can also use Benadryl over the counter for itching or also to help with sleep.   MEDICATIONS See your medication summary on the "After Visit Summary" that the nursing staff will review with you prior to discharge.  You may have some home medications which will be placed on hold until you complete the course of blood thinner medication.  It is important for you to complete the blood thinner medication as prescribed by your surgeon.  Continue your approved medications as instructed at time of discharge.  PRECAUTIONS If you experience chest pain or shortness of breath - call 911 immediately for transfer to the hospital emergency department.  If you develop a fever greater that 101 F,  purulent drainage from wound, increased redness or drainage from wound, foul odor from the wound/dressing, or calf pain - CONTACT YOUR SURGEON.                                                   FOLLOW-UP APPOINTMENTS Make sure you keep all of your appointments after your operation with your surgeon and caregivers. You should call the office at the above phone number and make an appointment for approximately two weeks after the date of your surgery or on the date instructed by your surgeon outlined in the "After Visit Summary".  RANGE OF MOTION AND STRENGTHENING EXERCISES  Rehabilitation of the knee is important following a knee injury or an operation. After just a few days of immobilization, the muscles of the thigh which control the knee become weakened and shrink (atrophy). Knee exercises are designed to build up the tone and  strength of the thigh muscles and to improve knee motion. Often times heat used for twenty to thirty minutes before working out will loosen up your tissues and help with improving the range of motion but do not use heat for the first two weeks following surgery. These exercises can be done on a training (exercise) mat, on the floor, on a table or on a bed. Use what ever works the best and is most comfortable for you Knee exercises include:  Leg Lifts - While your knee is still immobilized in a splint or cast, you can do straight leg raises. Lift the leg to 60 degrees, hold for 3 sec, and slowly lower the leg. Repeat 10-20 times 2-3 times daily. Perform this exercise against resistance later as your knee gets better.  Quad and Hamstring Sets - Tighten up the muscle on the front of the thigh (Quad) and hold for 5-10 sec. Repeat this 10-20 times hourly. Hamstring sets are done by pushing the foot backward against an object and holding for 5-10 sec. Repeat as with quad sets.  Leg Slides: Lying on your back, slowly slide your foot toward your buttocks, bending your knee up off the floor (only go as far as is comfortable). Then slowly slide your foot back down until your leg is flat on the floor again. Angel Wings: Lying on your back spread your legs to the side as far apart as you can without causing discomfort.  A rehabilitation program following serious knee injuries can speed recovery and prevent re-injury in the future due to weakened muscles. Contact your doctor or a physical therapist for more information on knee rehabilitation.   POST-OPERATIVE OPIOID TAPER INSTRUCTIONS: It is important to wean off of your opioid medication as soon as possible. If you do not need pain medication after your surgery it is ok to stop day one. Opioids include: Codeine, Hydrocodone(Norco, Vicodin), Oxycodone(Percocet, oxycontin) and hydromorphone amongst others.  Long term and even short term use of opiods can  cause: Increased pain response Dependence Constipation Depression Respiratory depression And more.  Withdrawal symptoms can include Flu like symptoms Nausea, vomiting And more Techniques to manage these symptoms Hydrate well Eat regular healthy meals Stay active Use relaxation techniques(deep breathing, meditating, yoga) Do Not substitute Alcohol to help with tapering If you have been on opioids for less than two weeks and do not have pain than it is ok to stop all together.  Plan  to wean off of opioids This plan should start within one week post op of your joint replacement. Maintain the same interval or time between taking each dose and first decrease the dose.  Cut the total daily intake of opioids by one tablet each day Next start to increase the time between doses. The last dose that should be eliminated is the evening dose.   IF YOU ARE TRANSFERRED TO A SKILLED REHAB FACILITY If the patient is transferred to a skilled rehab facility following release from the hospital, a list of the current medications will be sent to the facility for the patient to continue.  When discharged from the skilled rehab facility, please have the facility set up the patient's Home Health Physical Therapy prior to being released. Also, the skilled facility will be responsible for providing the patient with their medications at time of release from the facility to include their pain medication, the muscle relaxants, and their blood thinner medication. If the patient is still at the rehab facility at time of the two week follow up appointment, the skilled rehab facility will also need to assist the patient in arranging follow up appointment in our office and any transportation needs.  MAKE SURE YOU:  Understand these instructions.  Get help right away if you are not doing well or get worse.   DENTAL ANTIBIOTICS:  In most cases prophylactic antibiotics for Dental procdeures after total joint surgery are  not necessary.  Exceptions are as follows:  1. History of prior total joint infection  2. Severely immunocompromised (Organ Transplant, cancer chemotherapy, Rheumatoid biologic meds such as Humera)  3. Poorly controlled diabetes (A1C &gt; 8.0, blood glucose over 200)  If you have one of these conditions, contact your surgeon for an antibiotic prescription, prior to your dental procedure.    Pick up stool softner and laxative for home use following surgery while on pain medications. Do not submerge incision under water. Please use good hand washing techniques while changing dressing each day. May shower starting three days after surgery. Please use a clean towel to pat the incision dry following showers. Continue to use ice for pain and swelling after surgery. Do not use any lotions or creams on the incision until instructed by your surgeon.  

## 2024-08-13 NOTE — Care Plan (Signed)
 Ortho Bundle Case Management Note  Patient Details  Name: Crystal Little MRN: [id number] Date of Birth: [birth year]  RT TKA on 08/13/24  DCP: [facility]  DME: No needs; has RW  PT: [facility]                  DME Arranged:  N/A DME Agency:  NA  HH Arranged:    HH Agency:     Additional Comments: Please contact me with any questions of if this plan should need to change.  Burnard Dross, Case Manager [facility]  [phone]  Ext. [phone]   08/13/2024, 2:57 PM

## 2024-08-13 NOTE — Op Note (Signed)
 OPERATIVE REPORT-TOTAL KNEE ARTHROPLASTY   Pre-operative diagnosis- Osteoarthritis  Right knee(s)  Post-operative diagnosis- Osteoarthritis Right knee(s)  Procedure-  Right  Total Knee Arthroplasty  Surgeon- Crystal GAILS. Christianna Belmonte, MD  Assistant- Roxie Mess, PA-C   Anesthesia-  Adductor canal block and spinal  EBL- 25 ml   Drains None  Tourniquet time-  37 minutes @ 300 mm hg  Complications- None  Condition-PACU - hemodynamically stable.   Brief Clinical Note  Crystal Little is a 83 y.o. year old female with end stage OA of her right knee with progressively worsening pain and dysfunction. She has constant pain, with activity and at rest and significant functional deficits with difficulties even with ADLs. She has had extensive non-op management including analgesics, injections of cortisone and viscosupplements, and home exercise program, but remains in significant pain with significant dysfunction.Radiographs show bone on bone arthritis lateral and patellofemoral. She presents now for right Total Knee Arthroplasty.     Procedure in detail---   The patient is brought into the operating room and positioned supine on the operating table. After successful administration of  Adductor canal block and spinal,   a tourniquet is placed high on the  Right thigh(s) and the lower extremity is prepped and draped in the usual sterile fashion. Time out is performed by the operating team and then the  Right lower extremity is wrapped in Esmarch, knee flexed and the tourniquet inflated to 300 mmHg.       A midline incision is made with a ten blade through the subcutaneous tissue to the level of the extensor mechanism. A fresh blade is used to make a medial parapatellar arthrotomy. Soft tissue over the proximal medial tibia is subperiosteally elevated to the joint line with a knife and into the semimembranosus bursa with a Cobb elevator. Soft tissue over the proximal lateral tibia is elevated with  attention being paid to avoiding the patellar tendon on the tibial tubercle. The patella is everted, knee flexed 90 degrees and the ACL and PCL are removed. Findings are bone on bone lateral and patellofemoral with large global osteophytes        The drill is used to create a starting hole in the distal femur and the canal is thoroughly irrigated with sterile saline to remove the fatty contents. The 5 degree Right  valgus alignment guide is placed into the femoral canal and the distal femoral cutting block is pinned to remove 10 mm off the distal femur. Resection is made with an oscillating saw.      The tibia is subluxed forward and the menisci are removed. The extramedullary alignment guide is placed referencing proximally at the medial aspect of the tibial tubercle and distally along the second metatarsal axis and tibial crest. The block is pinned to remove 2mm off the more deficient lateral  side. Resection is made with an oscillating saw. Size 5is the most appropriate size for the tibia and the proximal tibia is prepared with the modular drill and keel punch for that size.      The femoral sizing guide is placed and size 6 is most appropriate. Rotation is marked off the epicondylar axis and confirmed by creating a rectangular flexion gap at 90 degrees. The size 6 cutting block is pinned in this rotation and the anterior, posterior and chamfer cuts are made with the oscillating saw. The intercondylar block is then placed and that cut is made.      Trial size 5 tibial component, trial size  6 posterior stabilized femur and a 8  mm posterior stabilized rotating platform insert trial is placed. Full extension is achieved with excellent varus/valgus and anterior/posterior balance throughout full range of motion. The patella is everted and thickness measured to be 22  mm. Free hand resection is taken to 12 mm, a 41 template is placed, lug holes are drilled, trial patella is placed, and it tracks normally.  Osteophytes are removed off the posterior femur with the trial in place. All trials are removed and the cut bone surfaces prepared with pulsatile lavage. Cement is mixed and once ready for implantation, the size 5 tibial implant, size  6 posterior stabilized femoral component, and the size 41 patella are cemented in place and the patella is held with the clamp. The trial insert is placed and the knee held in full extension. The Exparel  (20 ml mixed with 60 ml saline) is injected into the extensor mechanism, posterior capsule, medial and lateral gutters and subcutaneous tissues.  All extruded cement is removed and once the cement is hard the permanent 8 mm posterior stabilized rotating platform insert is placed into the tibial tray.      The wound is copiously irrigated with saline solution and the extensor mechanism closed with # 0 Stratofix suture. The tourniquet is released for a total tourniquet time of 37  minutes. Flexion against gravity is 140 degrees and the patella tracks normally. Subcutaneous tissue is closed with 2.0 vicryl and subcuticular with running 4.0 Monocryl. The incision is cleaned and dried and steri-strips and a bulky sterile dressing are applied. The limb is placed into a knee immobilizer and the patient is awakened and transported to recovery in stable condition.      Please note that a surgical assistant was a medical necessity for this procedure in order to perform it in a safe and expeditious manner. Surgical assistant was necessary to retract the ligaments and vital neurovascular structures to prevent injury to them and also necessary for proper positioning of the limb to allow for anatomic placement of the prosthesis.   Crystal ROCKFORD Kelle Ruppert, MD    08/13/2024, 1:42 PM

## 2024-08-13 NOTE — Interval H&P Note (Signed)
 History and Physical Interval Note:  08/13/2024 10:29 AM  Crystal Little  has presented today for surgery, with the diagnosis of Right knee osteoarthritis.  The various methods of treatment have been discussed with the patient and family. After consideration of risks, benefits and other options for treatment, the patient has consented to  Procedure(s): ARTHROPLASTY, KNEE, TOTAL (Right) as a surgical intervention.  The patient's history has been reviewed, patient examined, no change in status, stable for surgery.  I have reviewed the patient's chart and labs.  Questions were answered to the patient's satisfaction.     [name]

## 2024-08-13 NOTE — Anesthesia Postprocedure Evaluation (Signed)
 Anesthesia Post Note  Patient: Crystal Little  Procedure(s) Performed: ARTHROPLASTY, KNEE, TOTAL (Right: Knee)     Patient location during evaluation: Nursing Unit Anesthesia Type: Spinal Level of consciousness: awake, awake and alert and oriented Pain management: pain level controlled Vital Signs Assessment: post-procedure vital signs reviewed and stable Respiratory status: spontaneous breathing, nonlabored ventilation and respiratory function stable Cardiovascular status: blood pressure returned to baseline and stable Postop Assessment: no headache, no backache, spinal receding and no apparent nausea or vomiting Anesthetic complications: no   No notable events documented.  Last Vitals:  Vitals:   08/13/24 1530 08/13/24 1602  BP: (!) 145/83 132/66  Pulse: 67 77  Resp: 17 18  Temp:    SpO2: 100% 97%    Last Pain:  Vitals:   08/13/24 1804  TempSrc:   PainSc: 7                  Garnette FORBES Skillern

## 2024-08-13 NOTE — Transfer of Care (Signed)
 Immediate Anesthesia Transfer of Care Note  Patient: Crystal Little  Procedure(s) Performed: ARTHROPLASTY, KNEE, TOTAL (Right: Knee)  Patient Location: PACU  Anesthesia Type:Spinal  Level of Consciousness: awake and drowsy  Airway & Oxygen Therapy: Patient Spontanous Breathing and Patient connected to face mask oxygen  Post-op Assessment: Report given to RN and Post -op Vital signs reviewed and stable  Post vital signs: Reviewed and stable  Last Vitals:  Vitals Value Taken Time  BP 115/58 08/13/24 14:08  Temp    Pulse 71 08/13/24 14:10  Resp 17 08/13/24 14:10  SpO2 100 % 08/13/24 14:10  Vitals shown include unfiled device data.  Last Pain:  Vitals:   08/13/24 1110  TempSrc:   PainSc: 0-No pain         Complications: No notable events documented.

## 2024-08-13 NOTE — Plan of Care (Signed)

## 2024-08-13 NOTE — Anesthesia Procedure Notes (Signed)
 Anesthesia Regional Block: Adductor canal block   Pre-Anesthetic Checklist: , timeout performed,  Correct Patient, Correct Site, Correct Laterality,  Correct Procedure, Correct Position, site marked,  Risks and benefits discussed,  Surgical consent,  Pre-op evaluation,  At surgeon's request and post-op pain management  Laterality: Right  Prep: chloraprep       Needles:  Injection technique: Single-shot  Needle Type: Echogenic Needle     Needle Length: 9cm  Needle Gauge: 21     Additional Needles:   Procedures:,,,, ultrasound used (permanent image in chart),,    Narrative:  Start time: 08/13/2024 12:20 PM End time: 08/13/2024 12:27 PM Injection made incrementally with aspirations every 5 mL.  Performed by: Personally  Anesthesiologist: Corinne Garnette BRAVO, MD  Additional Notes: No pain on injection. No increased resistance to injection. Injection made in 5cc increments.  Good needle visualization.  Patient tolerated procedure well.

## 2024-08-13 NOTE — Progress Notes (Signed)
 Orthopedic Tech Progress Note Patient Details:  Crystal Little September 03, 1941 [id number] Applied CPM per order. Will remove at 6:33pm.  CPM Right Knee CPM Right Knee: On Right Knee Flexion (Degrees): 40 Right Knee Extension (Degrees): 10  Post Interventions Patient Tolerated: Well Instructions Provided: Adjustment of device, Care of device, Poper ambulation with device Ortho Devices Type of Ortho Device: CPM padding Ortho Device/Splint Location: RLE Ortho Device/Splint Interventions: Ordered, Application, Adjustment   Post Interventions Patient Tolerated: Well Instructions Provided: Adjustment of device, Care of device, Poper ambulation with device  [name] 08/13/2024, 2:34 PM

## 2024-08-13 NOTE — Progress Notes (Signed)
 Orthopedic Tech Progress Note Patient Details:  Crystal Little [birth year] [id number]  Patient ID: Crystal Little, female   DOB: 1941-02-23, 83 y.o.   MRN: [id number] Removed CPM from pt.  [name] 08/13/2024, 6:45 PM

## 2024-08-14 ENCOUNTER — Encounter (HOSPITAL_COMMUNITY): Payer: Self-pay | Admitting: Orthopedic Surgery

## 2024-08-14 DIAGNOSIS — M1711 Unilateral primary osteoarthritis, right knee: Secondary | ICD-10-CM | POA: Diagnosis not present

## 2024-08-14 LAB — BASIC METABOLIC PANEL WITH GFR
Anion gap: 12 (ref 5–15)
BUN: 16 mg/dL (ref 8–23)
CO2: 23 mmol/L (ref 22–32)
Calcium: 9.4 mg/dL (ref 8.9–10.3)
Chloride: 101 mmol/L (ref 98–111)
Creatinine, Ser: 0.84 mg/dL (ref 0.44–1.00)
GFR, Estimated: >60 mL/min (ref >60)
Glucose, Bld: 136 mg/dL — ABNORMAL HIGH (ref 70–99)
Potassium: 3.8 mmol/L (ref 3.5–5.1)
Sodium: 136 mmol/L (ref 135–145)

## 2024-08-14 LAB — CBC
HCT: 35 % — ABNORMAL LOW (ref 36.0–46.0)
Hemoglobin: 11.1 g/dL — ABNORMAL LOW (ref 12.0–15.0)
MCH: 29.7 pg (ref 26.0–34.0)
MCHC: 31.7 g/dL (ref 30.0–36.0)
MCV: 93.6 fL (ref 80.0–100.0)
Platelets: 266 K/uL (ref 150–400)
RBC: 3.74 MIL/uL — ABNORMAL LOW (ref 3.87–5.11)
RDW: 14.6 % (ref 11.5–15.5)
WBC: 14.3 K/uL — ABNORMAL HIGH (ref 4.0–10.5)
nRBC: 0 % (ref 0.0–0.2)

## 2024-08-14 NOTE — Evaluation (Addendum)
 Physical Therapy Evaluation Patient Details Name: Crystal Little MRN: [id number] DOB: 1940-11-24 Today's Date: 08/14/2024  History of Present Illness  83 yo female s/p R TKA on 08/13/24. Lymphedema, venous insufficiency, anemia, patellar tendon repair Lknee  10/2023, IBS, PVD, anxiety,  diverticulosis, back surgery  Clinical Impression  Pt is s/p TKA resulting in the deficits listed below (see PT Problem List).  Pt known to me from prior admission; pt motivated to work with PT. Amb ~ 6' with RW and min assist--KI on RLE, knee brace on LLE, limited by fatigue. Patient will benefit from continued inpatient follow up therapy, <3 hours/day   Pt will benefit from acute skilled PT to increase their independence and safety with mobility to allow discharge.          If plan is discharge home, recommend the following: A little help with walking and/or transfers;A little help with bathing/dressing/bathroom;Help with stairs or ramp for entrance;Assist for transportation;Assistance with cooking/housework   Can travel by private vehicle   No    Equipment Recommendations None recommended by PT  Recommendations for Other Services       Functional Status Assessment       Precautions / Restrictions Precautions Precautions: Knee;Fall Required Braces or Orthoses: Knee Immobilizer - Right Knee Immobilizer - Right: Discontinue once straight leg raise with < 10 degree lag      Mobility  Bed Mobility               General bed mobility comments: pt in recliner and returned to same    Transfers Overall transfer level: Needs assistance Equipment used: Rolling walker (2 wheels) Transfers: Sit to/from Stand Sit to Stand: Min assist           General transfer comment: incr time, assist to rise shift wt anteriorly and transition to RW; cues for hand placement and bilLE position    Ambulation/Gait Ambulation/Gait assistance: Min assist Gait Distance (Feet): 30 Feet Assistive  device: Rolling walker (2 wheels) Gait Pattern/deviations: Step-to pattern Gait velocity: decr     General Gait Details: cues for sequence, trunk extension, RW position and step length  Stairs            Wheelchair Mobility     Tilt Bed    Modified Rankin (Stroke Patients Only)       Balance Overall balance assessment: Needs assistance Sitting-balance support: Feet supported, Bilateral upper extremity supported, No upper extremity supported Sitting balance-Leahy Scale: Good     Standing balance support: Reliant on assistive device for balance, During functional activity, Bilateral upper extremity supported Standing balance-Leahy Scale: Poor Standing balance comment: reliant on device and external support                             Pertinent Vitals/Pain Pain Assessment Pain Assessment: Faces Faces Pain Scale: Hurts little more Pain Location: right knee Pain Descriptors / Indicators: Sore, Grimacing Pain Intervention(s): Limited activity within patient's tolerance, Monitored during session, Premedicated before session    Home Living Family/patient expects to be discharged to:: Private residence Living Arrangements: Alone Available Help at Discharge: Family;Friend(s);Available 24 hours/day Type of Home: Other(Comment) Home Access: Stairs to enter Entrance Stairs-Rails: Right Entrance Stairs-Number of Steps: 2   Home Layout: One level Home Equipment: Agricultural [employer]ant (2 wheels);Cane - single point;Other (comment) Additional Comments: wears brace on L knee    Prior Function Prior Level of Function : Independent/Modified Independent  Mobility Comments: using RW or cane prior to admit 2* L knee instability       Extremity/Trunk Assessment   Upper Extremity Assessment Upper Extremity Assessment: Overall WFL for tasks assessed    Lower Extremity Assessment Lower Extremity Assessment: RLE deficits/detail;LLE deficits/detail RLE  Deficits / Details: ankle WFL, knee ext with 10-15 degree quad lag, anticipated post op deficits LLE Deficits / Details: L knee instability at baseline; quads/hamstrings grossly 4/5. hip flexors 3+/5       Communication   Communication Communication: Impaired Factors Affecting Communication: Hearing impaired    Cognition Arousal: Alert Behavior During Therapy: WFL for tasks assessed/performed   PT - Cognitive impairments: No apparent impairments                         Following commands: Intact       Cueing Cueing Techniques: Verbal cues, Tactile cues     General Comments      Exercises Total Joint Exercises Ankle Circles/Pumps: AROM, Both, 10 reps   Assessment/Plan    PT Assessment Patient needs continued PT services  PT Problem List Decreased strength;Decreased activity tolerance;Decreased mobility;Decreased knowledge of use of DME;Decreased balance;Decreased range of motion;Pain       PT Treatment Interventions DME instruction;Therapeutic exercise;Gait training;Functional mobility training;Therapeutic activities;Patient/family education    PT Goals (Current goals can be found in the Care Plan section)  Acute Rehab PT Goals Patient Stated Goal: be able to do more, walk bettter PT Goal Formulation: With patient Time For Goal Achievement: 08/28/24 Potential to Achieve Goals: Good    Frequency 7X/week     Co-evaluation               AM-PAC PT 6 Clicks Mobility  Outcome Measure Help needed turning from your back to your side while in a flat bed without using bedrails?: A Little Help needed moving from lying on your back to sitting on the side of a flat bed without using bedrails?: A Little Help needed moving to and from a bed to a chair (including a wheelchair)?: A Little Help needed standing up from a chair using your arms (e.g., wheelchair or bedside chair)?: A Little Help needed to walk in hospital room?: A Little Help needed climbing 3-5  steps with a railing? : A Lot 6 Click Score: 17    End of Session Equipment Utilized During Treatment: Gait belt;Right knee immobilizer Activity Tolerance: Patient tolerated treatment well Patient left: in chair;with call bell/phone within reach;with chair alarm set;with family/visitor present Nurse Communication: Mobility status PT Visit Diagnosis: Other abnormalities of gait and mobility (R26.89)    Time: 8874-8848 PT Time Calculation (min) (ACUTE ONLY): 26 min   Charges:   PT Evaluation $PT Eval Low Complexity: 1 Low PT Treatments $Gait Training: 8-22 mins PT General Charges $$ ACUTE PT VISIT: 1 Visit         Yazhini Mcaulay, PT  Acute Rehab Dept ([facility]) [phone]  08/14/2024   [facility] 08/14/2024, 2:13 PM

## 2024-08-14 NOTE — Progress Notes (Signed)
 Physical Therapy Treatment Patient Details Name: Crystal Little MRN: [id number] DOB: 1941/09/14 Today's Date: 08/14/2024   History of Present Illness 83 yo female s/p R TKA on 08/13/24. Lymphedema, venous insufficiency, anemia, patellar tendon repair L knee 10/2023, IBS, PVD, anxiety,  diverticulosis, back surgery    PT Comments  Pt progressing toward PT goals. Incr amb distance tolerated this pm. Encouraged ankle pumps, quad sets and terminal knee ext. KI padded for skin protection. Continue PT POC.    If plan is discharge home, recommend the following: A little help with walking and/or transfers;A little help with bathing/dressing/bathroom;Help with stairs or ramp for entrance;Assist for transportation;Assistance with cooking/housework   Can travel by private vehicle     No  Equipment Recommendations  None recommended by PT    Recommendations for Other Services       Precautions / Restrictions Precautions Precautions: Knee;Fall Required Braces or Orthoses: Knee Immobilizer - Right Knee Immobilizer - Right: Discontinue once straight leg raise with < 10 degree lag Restrictions Weight Bearing Restrictions Per Provider Order: No     Mobility  Bed Mobility               General bed mobility comments: pt in recliner and returned to same    Transfers Overall transfer level: Needs assistance Equipment used: Rolling walker (2 wheels) Transfers: Sit to/from Stand, Bed to chair/wheelchair/BSC Sit to Stand: Min assist   Step pivot transfers: Min assist       General transfer comment: incr time, assist to rise shift wt anteriorly and transition to RW; cues for hand placement and bilLE position    Ambulation/Gait Ambulation/Gait assistance: Min assist Gait Distance (Feet): 50 Feet Assistive device: Rolling walker (2 wheels) Gait Pattern/deviations: Step-to pattern, Trunk flexed Gait velocity: decr     General Gait Details: cues for sequence, trunk extension, RW  position and step length   Stairs             Wheelchair Mobility     Tilt Bed    Modified Rankin (Stroke Patients Only)       Balance Overall balance assessment: Needs assistance Sitting-balance support: Feet supported, Bilateral upper extremity supported, No upper extremity supported Sitting balance-Leahy Scale: Good     Standing balance support: Reliant on assistive device for balance, During functional activity, Bilateral upper extremity supported Standing balance-Leahy Scale: Poor Standing balance comment: reliant on device and external support                            Communication Communication Communication: Impaired Factors Affecting Communication: Hearing impaired  Cognition Arousal: Alert Behavior During Therapy: WFL for tasks assessed/performed   PT - Cognitive impairments: No apparent impairments                         Following commands: Intact      Cueing Cueing Techniques: Verbal cues, Tactile cues  Exercises Total Joint Exercises Ankle Circles/Pumps: AROM, Both, 10 reps Quad Sets: AROM, 5 reps, Both    General Comments        Pertinent Vitals/Pain Pain Assessment Pain Assessment: Faces Faces Pain Scale: Hurts little more Pain Location: right knee Pain Descriptors / Indicators: Sore, Grimacing Pain Intervention(s): Limited activity within patient's tolerance, Monitored during session, Premedicated before session, Repositioned, Ice applied    Home Living Family/patient expects to be discharged to:: Private residence Living Arrangements: Alone Available Help at Discharge: Family;Friend(s);Available 24  hours/day Type of Home: Other(Comment) Home Access: Stairs to enter Entrance Stairs-Rails: Right Entrance Stairs-Number of Steps: 2   Home Layout: One level Home Equipment: Agricultural Consultant (2 wheels);Cane - single point;Other (comment) Additional Comments: wears brace on L knee    Prior Function             PT Goals (current goals can now be found in the care plan section) Acute Rehab PT Goals Patient Stated Goal: be able to do more, walk bettter PT Goal Formulation: With patient Time For Goal Achievement: 08/28/24 Potential to Achieve Goals: Good Progress towards PT goals: Progressing toward goals    Frequency    7X/week      PT Plan      Co-evaluation              AM-PAC PT 6 Clicks Mobility   Outcome Measure  Help needed turning from your back to your side while in a flat bed without using bedrails?: A Little Help needed moving from lying on your back to sitting on the side of a flat bed without using bedrails?: A Little Help needed moving to and from a bed to a chair (including a wheelchair)?: A Little Help needed standing up from a chair using your arms (e.g., wheelchair or bedside chair)?: A Little Help needed to walk in hospital room?: A Little Help needed climbing 3-5 steps with a railing? : A Lot 6 Click Score: 17    End of Session Equipment Utilized During Treatment: Gait belt;Right knee immobilizer Activity Tolerance: Patient tolerated treatment well;Patient limited by fatigue Patient left: in chair;with call bell/phone within reach;with chair alarm set Nurse Communication: Mobility status PT Visit Diagnosis: Other abnormalities of gait and mobility (R26.89)     Time: 8547-8468 PT Time Calculation (min) (ACUTE ONLY): 39 min  Charges:    $Gait Training: 23-37 mins $Therapeutic Activity: 8-22 mins PT General Charges $$ ACUTE PT VISIT: 1 Visit                     Rexene, PT  Acute Rehab Dept [facility]) [phone]  08/14/2024    [facility] 08/14/2024, 3:40 PM

## 2024-08-14 NOTE — Progress Notes (Signed)
   Subjective: 1 Day Post-Op Procedure(s) (LRB): ARTHROPLASTY, KNEE, TOTAL (Right) Patient reports pain as mild.   Patient seen in rounds by Dr. Melodi. Patient is well, and has had no acute complaints or problems.No issues overnight.  We will begin therapy today.  Objective: Vital signs in last 24 hours: Temp:  [97.4 F (36.3 C)-98.8 F (37.1 C)] 97.4 F (36.3 C) (11/11 0915) Pulse Rate:  [50-91] 65 (11/11 0915) Resp:  [13-26] 16 (11/11 0915) BP: (113-155)/(56-106) 114/58 (11/11 0915) SpO2:  [93 %-100 %] 98 % (11/11 0915) Weight:  [90 kg] 90 kg (11/10 1110)  Intake/Output from previous day:  Intake/Output Summary (Last 24 hours) at 08/14/2024 1020 Last data filed at 08/14/2024 1002 Gross per 24 hour  Intake 3414.48 ml  Output 1600 ml  Net 1814.48 ml     Intake/Output this shift: Total I/O In: 340 [P.O.:340] Out: 200 [Urine:200]  Labs: No results for input(s): HGB in the last 72 hours. No results for input(s): WBC, RBC, HCT, PLT in the last 72 hours. No results for input(s): NA, K, CL, CO2, BUN, CREATININE, GLUCOSE, CALCIUM in the last 72 hours. No results for input(s): LABPT, INR in the last 72 hours.  Exam: General - Patient is Alert and Oriented Extremity - Neurologically intact Neurovascular intact Sensation intact distally Dorsiflexion/Plantar flexion intact Dressing - dressing C/D/I Motor Function - intact, moving foot and toes well on exam.   Past Medical History:  Diagnosis Date   Anemia    Anxiety    Diverticulosis    Frequent UTI    sees Dr. Nicholaus   GERD (gastroesophageal reflux disease)    History of hiatal hernia    IBS (irritable bowel syndrome)    Leg edema    Osteoarthritis    Peripheral vascular disease    Status post dilation of esophageal narrowing    Varicose veins of legs    sees [facility]     Assessment/Plan: 1 Day Post-Op Procedure(s) (LRB): ARTHROPLASTY, KNEE, TOTAL  (Right) Principal Problem:   OA (osteoarthritis) of knee Active Problems:   Primary osteoarthritis of right knee  Estimated body mass index is 35.15 kg/m as calculated from the following:   Height as of this encounter: 5' 3 (1.6 m).   Weight as of this encounter: 90 kg. Advance diet Up with therapy D/C IV fluids   Patient's anticipated LOS is less than 2 midnights, meeting these requirements: - Lives within 1 hour of care - Has a competent adult at home to recover with post-op recover - NO history of  - Chronic pain requiring opiods  - Diabetes  - Coronary Artery Disease  - Heart failure  - Heart attack  - Stroke  - DVT/VTE  - Cardiac arrhythmia  - Respiratory Failure/COPD  - Renal failure  - Anemia  - Advanced Liver disease  DVT Prophylaxis - Aspirin  Weight bearing as tolerated. Begin therapy.  Plan is to go Home after hospital stay. Social work consulted for SNF placement, patient prefers [facility]. Probable discharge tomorrow pending bed availability.   Roxie Mess, PA-C Orthopedic Surgery [phone] 08/14/2024, 10:20 AM

## 2024-08-14 NOTE — TOC Initial Note (Addendum)
 Transition of Care [facility]) - Initial/Assessment Note    Patient Details  Name: Crystal Little MRN: [id number] Date of Birth: 1941-07-15  Transition of Care [facility]) CM/SW Contact:    Alfonse JONELLE Rex, RN Phone Number: 08/14/2024, 12:43 PM  Clinical Narrative:    Patient admitted for scheduled R TKA on 11/10. TOC consult for SNF Placement, per documentation prefers [facility].  Referral to [facility] sent in SNF HUB, Brittany, admit coordinator w/[facility] , notified of referral. FL2 updated. Level 2 PASRR pending. PT eval pending, await recommendation.      -4:12pm PT eval completed, recommendation for short term rehab, patient agreeable, prefers [facility]. SNF auth initiated via [facility]. Adolph Shara BALBOA [id number], days approved 08/14/2024-08/16/2024         Expected Discharge Plan: Skilled Nursing Facility Barriers to Discharge: Continued Medical Work up   Patient Goals and CMS Choice Patient states their goals for this hospitalization and ongoing recovery are:: [facility] SNF prior to return home CMS Medicare.gov Compare Post Acute Care list provided to:: Patient Choice offered to / list presented to : Patient [place] ownership interest in [facility].provided to:: Patient    Expected Discharge Plan and Services       Living arrangements for the past 2 months: Single Family Home                 DME Arranged: N/A DME Agency: NA                  Prior Living Arrangements/Services Living arrangements for the past 2 months: Single Family Home Lives with:: Self Patient language and need for interpreter reviewed:: Yes Do you feel safe going back to the place where you live?: Yes      Need for Family Participation in Patient Care: Yes (Comment) Care giver support system in place?: Yes (comment)   Criminal Activity/Legal Involvement Pertinent to Current Situation/Hospitalization: No - Comment as needed  Activities of Daily Living   ADL Screening  (condition at time of admission) Independently performs ADLs?: Yes (appropriate for developmental age) Is the patient deaf or have difficulty hearing?: Yes Does the patient have difficulty seeing, even when wearing glasses/contacts?: Yes Does the patient have difficulty concentrating, remembering, or making decisions?: No  Permission Sought/Granted                  Emotional Assessment       Orientation: : Oriented to Self, Oriented to Place, Oriented to  Time, Oriented to Situation Alcohol / Substance Use: Not Applicable Psych Involvement: No (comment)  Admission diagnosis:  Primary osteoarthritis of right knee [M17.11] Patient Active Problem List   Diagnosis Date Noted   Primary osteoarthritis of right knee 08/13/2024   Iron  deficiency anemia 06/20/2024   Obesity (BMI 30.0-34.9) 06/20/2024   Chronic venous insufficiency 03/20/2024   Lymphedema 03/20/2024   Peri-prosthetic patellar fracture 10/19/2023   Patellar tendon rupture, left, initial encounter 10/19/2023   Nocturnal leg cramps 06/20/2023   Leg swelling 11/09/2021   Sciatica, right side 05/17/2017   Special screening for malignant neoplasms, colon 04/26/2011   Diverticulosis of colon 04/26/2011   Dysphagia, pharyngoesophageal phase 01/08/2010   Enthesopathy of ankle and tarsus 12/24/2008   PLANTAR FASCIITIS, LEFT 12/24/2008   OA (osteoarthritis) of knee 05/09/2007   DEPENDENT EDEMA 05/09/2007   Personal history presenting hazards to health 05/09/2007   GERD 05/02/2007   PCP:  Johnny Garnette LABOR, MD Pharmacy:   [id number] [address], [place] - [address] [place] [address] Phone: [phone] Fax: [phone]     Social Drivers of Health (SDOH) Social History: SDOH Screenings   Food Insecurity: No Food Insecurity (08/13/2024)  Housing: Low Risk  (08/13/2024)  Transportation Needs: No Transportation Needs (08/13/2024)  Utilities: Not At Risk (08/13/2024)  Alcohol Screen: Low  Risk  (06/13/2023)  Depression (PHQ2-9): Low Risk  (06/20/2024)  Financial Resource Strain: Low Risk  (06/13/2023)  Physical Activity: Insufficiently Active (06/13/2023)  Social Connections: Moderately Integrated (08/13/2024)  Stress: No Stress Concern Present (06/13/2023)  Tobacco Use: Low Risk  (08/13/2024)  Health Literacy: Adequate Health Literacy (06/13/2023)   SDOH Interventions: Housing Interventions: Intervention Not Indicated Transportation Interventions: Intervention Not Indicated Utilities Interventions: Intervention Not Indicated Social Connections Interventions: Intervention Not Indicated   Readmission Risk Interventions     No data to display

## 2024-08-14 NOTE — NC FL2 (Signed)
 [place]  MEDICAID FL2 LEVEL OF CARE FORM     IDENTIFICATION  Patient Name: Crystal Little Birthdate: 03/29/1941 Sex: female Admission Date (Current Location): 08/13/2024  [facility] and [place] Number:  Producer, Television/film/video and Address:  [facility],  [address] [place] [address]      Provider Number: [id number]  Attending Physician Name and Address:  Melodi Lerner, MD  Relative Name and Phone Number:  ASHLYE, OVIEDO (Daughter)  [phone] (Mobile)    Current Level of Care: Hospital Recommended Level of Care: Skilled Nursing Facility Prior Approval Number:    Date Approved/Denied:   PASRR Number: pending  Discharge Plan: SNF    Current Diagnoses: Patient Active Problem List   Diagnosis Date Noted   Primary osteoarthritis of right knee 08/13/2024   Iron  deficiency anemia 06/20/2024   Obesity (BMI 30.0-34.9) 06/20/2024   Chronic venous insufficiency 03/20/2024   Lymphedema 03/20/2024   Peri-prosthetic patellar fracture 10/19/2023   Patellar tendon rupture, left, initial encounter 10/19/2023   Nocturnal leg cramps 06/20/2023   Leg swelling 11/09/2021   Sciatica, right side 05/17/2017   Special screening for malignant neoplasms, colon 04/26/2011   Diverticulosis of colon 04/26/2011   Dysphagia, pharyngoesophageal phase 01/08/2010   Enthesopathy of ankle and tarsus 12/24/2008   PLANTAR FASCIITIS, LEFT 12/24/2008   OA (osteoarthritis) of knee 05/09/2007   DEPENDENT EDEMA 05/09/2007   Personal history presenting hazards to health 05/09/2007   GERD 05/02/2007    Orientation RESPIRATION BLADDER Height & Weight     Self, Time, Situation, Place  Normal Continent Weight: 90 kg Height:  5' 3 (160 cm)  BEHAVIORAL SYMPTOMS/MOOD NEUROLOGICAL BOWEL NUTRITION STATUS      Continent Diet (regular)  AMBULATORY STATUS COMMUNICATION OF NEEDS Skin   Limited Assist Verbally Surgical wounds (Right TKA)                       Personal Care  Assistance Level of Assistance  Bathing, Feeding, Dressing Bathing Assistance: Limited assistance Feeding assistance: Limited assistance Dressing Assistance: Limited assistance     Functional Limitations Info  Sight, Hearing, Speech Sight Info: Adequate Hearing Info: Impaired (Hard of Hearing) Speech Info: Adequate    SPECIAL CARE FACTORS FREQUENCY  PT (By licensed PT), OT (By licensed OT)     PT Frequency: 5x/wk OT Frequency: 5x/wk            Contractures Contractures Info: Not present    Additional Factors Info  Code Status, Allergies, Psychotropic Code Status Info: Full Code Allergies Info: Amoxicillin  Psychotropic Info: Xanax  0.25mg  po prn QHS         Current Medications (08/14/2024):  This is the current hospital active medication list Current Facility-Administered Medications  Medication Dose Route Frequency Provider Last Rate Last Admin   0.9 %  sodium chloride  infusion   Intravenous Continuous Edmisten, Kristie L, PA 75 mL/hr at 08/13/24 1718 New Bag at 08/13/24 1718   acetaminophen  (TYLENOL ) tablet 1,000 mg  1,000 mg Oral Q6H Edmisten, Kristie L, PA   1,000 mg at 08/14/24 0553   acetaminophen  (TYLENOL ) tablet 325-650 mg  325-650 mg Oral Q6H PRN Edmisten, Kristie L, PA       ALPRAZolam  (XANAX ) tablet 0.25 mg  0.25 mg Oral QHS PRN Edmisten, Kristie L, PA   0.25 mg at 08/13/24 2059   aspirin  chewable tablet 81 mg  81 mg Oral BID Edmisten, Kristie L, PA   81 mg at 08/14/24 0805   bisacodyl  (DULCOLAX) suppository  10 mg  10 mg Rectal Daily PRN Edmisten, Kristie L, PA       cyclobenzaprine  (FLEXERIL ) tablet 10 mg  10 mg Oral TID PRN Edmisten, Kristie L, PA   10 mg at 08/13/24 1714   diphenhydrAMINE  (BENADRYL ) 12.5 MG/5ML elixir 12.5-25 mg  12.5-25 mg Oral Q4H PRN Edmisten, Kristie L, PA       docusate sodium  (COLACE) capsule 100 mg  100 mg Oral BID Edmisten, Kristie L, PA   100 mg at 08/14/24 0805   ferrous sulfate  tablet 325 mg  325 mg Oral Q breakfast Edmisten,  Kristie L, PA   325 mg at 08/14/24 9193   HYDROmorphone  (DILAUDID ) injection 0.5-1 mg  0.5-1 mg Intravenous Q4H PRN Edmisten, Kristie L, PA   1 mg at 08/14/24 0129   menthol  (CEPACOL) lozenge 3 mg  1 lozenge Oral PRN Edmisten, Kristie L, PA       Or   phenol (CHLORASEPTIC) mouth spray 1 spray  1 spray Mouth/Throat PRN Edmisten, Kristie L, PA       metoCLOPramide  (REGLAN ) tablet 5-10 mg  5-10 mg Oral Q8H PRN Edmisten, Kristie L, PA       Or   metoCLOPramide  (REGLAN ) injection 5-10 mg  5-10 mg Intravenous Q8H PRN Edmisten, Kristie L, PA       ondansetron  (ZOFRAN ) tablet 4 mg  4 mg Oral Q6H PRN Edmisten, Kristie L, PA       Or   ondansetron  (ZOFRAN ) injection 4 mg  4 mg Intravenous Q6H PRN Edmisten, Kristie L, PA       oxyCODONE  (Oxy IR/ROXICODONE ) immediate release tablet 5-10 mg  5-10 mg Oral Q4H PRN Edmisten, Kristie L, PA   10 mg at 08/14/24 1003   pantoprazole  (PROTONIX ) EC tablet 40 mg  40 mg Oral Daily Edmisten, Kristie L, PA   40 mg at 08/14/24 0805   polyethylene glycol (MIRALAX  / GLYCOLAX ) packet 17 g  17 g Oral Daily PRN Edmisten, Kristie L, PA       sodium phosphate  (FLEET) enema 1 enema  1 enema Rectal Once PRN Edmisten, Kristie L, PA       torsemide  (DEMADEX ) tablet 40 mg  40 mg Oral Daily Edmisten, Kristie L, PA   40 mg at 08/14/24 9193   traMADol  (ULTRAM ) tablet 50-100 mg  50-100 mg Oral Q6H PRN Edmisten, Kristie L, PA         Discharge Medications: Please see discharge summary for a list of discharge medications.  Relevant Imaging Results:  Relevant Lab Results:   Additional Information SSN: [id number]  Alfonse JONELLE Rex, RN

## 2024-08-14 NOTE — Care Management Obs Status (Signed)
 MEDICARE OBSERVATION STATUS NOTIFICATION   Patient Details  Name: KRISTENE LIBERATI MRN: [id number] Date of Birth: 13-Mar-1941   Medicare Observation Status Notification Given:  Yes    Alfonse JONELLE Rex, RN 08/14/2024, 3:35 PM

## 2024-08-14 NOTE — TOC PASRR Note (Signed)
 30 Day PASRR Note   Patient Details  Name: Crystal Little Date of Birth: Dec 14, 1940   Transition of Care [facility]) CM/SW Contact:    Alfonse JONELLE Rex, RN Phone Number: 08/14/2024, 12:35 PM  To Whom It May Concern:  Please be advised that this patient will require a short-term nursing home stay - anticipated 30 days or less for rehabilitation and strengthening.   The plan is for return home.

## 2024-08-15 DIAGNOSIS — M1711 Unilateral primary osteoarthritis, right knee: Secondary | ICD-10-CM | POA: Diagnosis not present

## 2024-08-15 LAB — CBC
HCT: 31.2 % — ABNORMAL LOW (ref 36.0–46.0)
Hemoglobin: 9.9 g/dL — ABNORMAL LOW (ref 12.0–15.0)
MCH: 29.5 pg (ref 26.0–34.0)
MCHC: 31.7 g/dL (ref 30.0–36.0)
MCV: 92.9 fL (ref 80.0–100.0)
Platelets: 250 K/uL (ref 150–400)
RBC: 3.36 MIL/uL — ABNORMAL LOW (ref 3.87–5.11)
RDW: 14.7 % (ref 11.5–15.5)
WBC: 14.9 K/uL — ABNORMAL HIGH (ref 4.0–10.5)
nRBC: 0 % (ref 0.0–0.2)

## 2024-08-15 MED ORDER — ONDANSETRON HCL 4 MG PO TABS: 4.0000 mg | ORAL_TABLET | Freq: Four times a day (QID) | ORAL | Status: DC | PRN

## 2024-08-15 MED ORDER — CYCLOBENZAPRINE HCL 10 MG PO TABS: 10.0000 mg | ORAL_TABLET | Freq: Three times a day (TID) | ORAL | 0 refills | Status: DC | PRN

## 2024-08-15 MED ORDER — BISACODYL 10 MG RE SUPP: 10.0000 mg | Freq: Every day | RECTAL | Status: AC | PRN

## 2024-08-15 MED ORDER — POLYETHYLENE GLYCOL 3350 17 G PO PACK: 17.0000 g | PACK | Freq: Every day | ORAL | Status: AC | PRN

## 2024-08-15 MED ORDER — OXYCODONE HCL 5 MG PO TABS: 5.0000 mg | ORAL_TABLET | ORAL | 0 refills | Status: DC | PRN

## 2024-08-15 MED ORDER — ONDANSETRON HCL 4 MG/2ML IJ SOLN: 4.0000 mg | Freq: Four times a day (QID) | INTRAMUSCULAR | 0 refills | Status: DC | PRN

## 2024-08-15 MED ORDER — TRAMADOL HCL 50 MG PO TABS: 50.0000 mg | ORAL_TABLET | Freq: Four times a day (QID) | ORAL | 0 refills | Status: DC | PRN

## 2024-08-15 MED ORDER — ASPIRIN 81 MG PO CHEW: 81.0000 mg | CHEWABLE_TABLET | Freq: Two times a day (BID) | ORAL | 0 refills | Status: DC

## 2024-08-15 NOTE — Progress Notes (Signed)
 Physical Therapy Treatment Patient Details Name: Crystal Little MRN: [id number] DOB: May 18, 1941 Today's Date: 08/15/2024   History of Present Illness 83 yo female s/p R TKA on 08/13/24. Lymphedema, venous insufficiency, anemia, patellar tendon repair L knee 10/2023, IBS, PVD, anxiety,  diverticulosis, back surgery    PT Comments  Pt tolerated session well with improved participation compared to the morning with improved pain control. Improved gait tolerance and distance to 37ft, continued cues required for sequencing. She will continue to benefit from skill physical therapy intervention while admitted to acute rehab.    If plan is discharge home, recommend the following: A little help with walking and/or transfers;A little help with bathing/dressing/bathroom;Help with stairs or ramp for entrance;Assist for transportation;Assistance with cooking/housework   Can travel by private vehicle     No  Equipment Recommendations  None recommended by PT    Recommendations for Other Services       Precautions / Restrictions Precautions Precautions: Knee;Fall Recall of Precautions/Restrictions: Intact Knee Immobilizer - Right: Discontinue once straight leg raise with < 10 degree lag Restrictions Weight Bearing Restrictions Per Provider Order: No     Mobility  Bed Mobility Overal bed mobility: Needs Assistance Bed Mobility: Supine to Sit, Sit to Supine     Supine to sit: +2 for physical assistance Sit to supine: +2 for physical assistance   General bed mobility comments: incr time and effort, cont multimodal cues, +2 from flat bed with bedrails, assist with trunk elevation, movement of BLE R>L    Transfers Overall transfer level: Needs assistance Equipment used: Rolling walker (2 wheels) Transfers: Sit to/from Stand Sit to Stand: Mod assist, Max assist           General transfer comment: incr time, assist to rise shift wt anteriorly and transition to RW; cues for hand  placement, trunk extension and bil LE position    Ambulation/Gait Ambulation/Gait assistance: Min assist, +2 safety/equipment Gait Distance (Feet): 35 Feet Assistive device: Rolling walker (2 wheels) Gait Pattern/deviations: Step-to pattern, Trunk flexed Gait velocity: decr     General Gait Details: cues for sequence, trunk extension, RW position and step length; distance limited by pain         Communication Communication Communication: No apparent difficulties;Impaired Factors Affecting Communication: Hearing impaired  Cognition Arousal: Alert Behavior During Therapy: WFL for tasks assessed/performed   PT - Cognitive impairments: No apparent impairments                         Following commands: Intact      Cueing Cueing Techniques: Verbal cues, Tactile cues  Exercises Total Joint Exercises Ankle Circles/Pumps: AROM, Both, 10 reps    General Comments        Pertinent Vitals/Pain Pain Assessment Pain Assessment: Faces Faces Pain Scale: Hurts even more Pain Location: right knee Pain Descriptors / Indicators: Sore, Grimacing Pain Intervention(s): Premedicated before session, Ice applied, Limited activity within patient's tolerance, Monitored during session    PT Goals (current goals can now be found in the care plan section) Acute Rehab PT Goals Patient Stated Goal: be able to do more, walk bettter PT Goal Formulation: With patient Time For Goal Achievement: 08/28/24 Potential to Achieve Goals: Good Progress towards PT goals: Progressing toward goals    Frequency    7X/week      PT Plan      Co-evaluation              AM-PAC PT 6 Clicks  Mobility   Outcome Measure  Help needed turning from your back to your side while in a flat bed without using bedrails?: A Lot Help needed moving from lying on your back to sitting on the side of a flat bed without using bedrails?: A Lot Help needed moving to and from a bed to a chair (including  a wheelchair)?: A Lot Help needed standing up from a chair using your arms (e.g., wheelchair or bedside chair)?: A Lot Help needed to walk in hospital room?: A Lot Help needed climbing 3-5 steps with a railing? : Total 6 Click Score: 11    End of Session Equipment Utilized During Treatment: Gait belt Activity Tolerance: Patient limited by fatigue Patient left: with call bell/phone within reach;in bed;with family/visitor present;with bed alarm set   PT Visit Diagnosis: Other abnormalities of gait and mobility (R26.89)     Time: 8387-8358 PT Time Calculation (min) (ACUTE ONLY): 29 min  Charges:    $Gait Training: 8-22 mins $Therapeutic Activity: 8-22 mins PT General Charges $$ ACUTE PT VISIT: 1 Visit                    Isaiah DEL. Jaques Mineer, PT, DPT   [employer] 08/15/2024, 5:01 PM

## 2024-08-15 NOTE — Discharge Summary (Addendum)
 Physician Discharge Summary   Patient ID: Crystal Little MRN: [id number] DOB/AGE: 01-14-1941 83 y.o.  Admit date: 08/13/2024 Discharge date: 08/16/2024  Primary Diagnosis: Osteoarthritis, right knee   Admission Diagnoses:  Past Medical History:  Diagnosis Date   Anemia    Anxiety    Diverticulosis    Frequent UTI    sees Dr. Nicholaus   GERD (gastroesophageal reflux disease)    History of hiatal hernia    IBS (irritable bowel syndrome)    Leg edema    Osteoarthritis    Peripheral vascular disease    Status post dilation of esophageal narrowing    Varicose veins of legs    sees [facility]    Discharge Diagnoses:   Principal Problem:   OA (osteoarthritis) of knee Active Problems:   Primary osteoarthritis of right knee  Estimated body mass index is 35.15 kg/m as calculated from the following:   Height as of this encounter: 5' 3 (1.6 m).   Weight as of this encounter: 90 kg.  Procedure:  Procedure(s) (LRB): ARTHROPLASTY, KNEE, TOTAL (Right)   Consults: None  HPI: Crystal Little is a 83 y.o. year old female with end stage OA of her right knee with progressively worsening pain and dysfunction. She has constant pain, with activity and at rest and significant functional deficits with difficulties even with ADLs. She has had extensive non-op management including analgesics, injections of cortisone and viscosupplements, and home exercise program, but remains in significant pain with significant dysfunction.Radiographs show bone on bone arthritis lateral and patellofemoral. She presents now for right Total Knee Arthroplasty.     Laboratory Data: Admission on 08/13/2024  Component Date Value Ref Range Status   WBC 08/14/2024 14.3 (H)  4.0 - 10.5 K/uL Final   RBC 08/14/2024 3.74 (L)  3.87 - 5.11 MIL/uL Final   Hemoglobin 08/14/2024 11.1 (L)  12.0 - 15.0 g/dL Final   HCT 88/88/7974 35.0 (L)  36.0 - 46.0 % Final   MCV 08/14/2024 93.6  80.0 - 100.0 fL Final   MCH  08/14/2024 29.7  26.0 - 34.0 pg Final   MCHC 08/14/2024 31.7  30.0 - 36.0 g/dL Final   RDW 88/88/7974 14.6  11.5 - 15.5 % Final   Platelets 08/14/2024 266  150 - 400 K/uL Final   nRBC 08/14/2024 0.0  0.0 - 0.2 % Final   Performed at [facility], [address] [place], [place] [address]   Sodium 08/14/2024 136  135 - 145 mmol/L Final   Potassium 08/14/2024 3.8  3.5 - 5.1 mmol/L Final   Chloride 08/14/2024 101  98 - 111 mmol/L Final   CO2 08/14/2024 23  22 - 32 mmol/L Final   Glucose, Bld 08/14/2024 136 (H)  70 - 99 mg/dL Final   Glucose reference range applies only to samples taken after fasting for at least 8 hours.   BUN 08/14/2024 16  8 - 23 mg/dL Final   Creatinine, Ser 08/14/2024 0.84  0.44 - 1.00 mg/dL Final   Calcium 88/88/7974 9.4  8.9 - 10.3 mg/dL Final   GFR, Estimated 08/14/2024 >60  >60 mL/min Final   Comment: (NOTE) Calculated using the CKD-EPI Creatinine Equation (2021)    Anion gap 08/14/2024 12  5 - 15 Final   Performed at [facility], [address] [place], [place] [address]   WBC 08/15/2024 14.9 (H)  4.0 - 10.5 K/uL Final   RBC 08/15/2024 3.36 (L)  3.87 - 5.11 MIL/uL  Final   Hemoglobin 08/15/2024 9.9 (L)  12.0 - 15.0 g/dL Final   HCT 88/87/7974 31.2 (L)  36.0 - 46.0 % Final   MCV 08/15/2024 92.9  80.0 - 100.0 fL Final   MCH 08/15/2024 29.5  26.0 - 34.0 pg Final   MCHC 08/15/2024 31.7  30.0 - 36.0 g/dL Final   RDW 88/87/7974 14.7  11.5 - 15.5 % Final   Platelets 08/15/2024 250  150 - 400 K/uL Final   nRBC 08/15/2024 0.0  0.0 - 0.2 % Final   Performed at [facility], [address], [place], [place] [address]  Hospital Outpatient Visit on 07/31/2024  Component Date Value Ref Range Status   Sodium 07/31/2024 138  135 - 145 mmol/L Final   Potassium 07/31/2024 4.5  3.5 - 5.1 mmol/L Final   Chloride 07/31/2024 103  98 - 111 mmol/L Final   CO2 07/31/2024 30  22 - 32 mmol/L Final   Glucose, Bld 07/31/2024  123 (H)  70 - 99 mg/dL Final   Glucose reference range applies only to samples taken after fasting for at least 8 hours.   BUN 07/31/2024 17  8 - 23 mg/dL Final   Creatinine, Ser 07/31/2024 0.76  0.44 - 1.00 mg/dL Final   Calcium 89/71/7974 9.8  8.9 - 10.3 mg/dL Final   GFR, Estimated 07/31/2024 >60  >60 mL/min Final   Comment: (NOTE) Calculated using the CKD-EPI Creatinine Equation (2021)    Anion gap 07/31/2024 5  5 - 15 Final   Performed at [facility], [address], [place], [place] [address]   WBC 07/31/2024 8.0  4.0 - 10.5 K/uL Final   RBC 07/31/2024 4.27  3.87 - 5.11 MIL/uL Final   Hemoglobin 07/31/2024 12.1  12.0 - 15.0 g/dL Final   HCT 89/71/7974 39.6  36.0 - 46.0 % Final   MCV 07/31/2024 92.7  80.0 - 100.0 fL Final   MCH 07/31/2024 28.3  26.0 - 34.0 pg Final   MCHC 07/31/2024 30.6  30.0 - 36.0 g/dL Final   RDW 89/71/7974 14.5  11.5 - 15.5 % Final   Platelets 07/31/2024 290  150 - 400 K/uL Final   nRBC 07/31/2024 0.0  0.0 - 0.2 % Final   Performed at [facility], [address], [place], [place] [address]   MRSA, PCR 07/31/2024 NEGATIVE  NEGATIVE Final   Staphylococcus aureus 07/31/2024 NEGATIVE  NEGATIVE Final   Comment: (NOTE) The Xpert SA Assay (FDA approved for NASAL specimens in patients 72 years of age and older), is one component of a comprehensive surveillance program. It is not intended to diagnose infection nor to guide or monitor treatment. Performed at [facility], [address], [place], [place] [address]      X-Rays:No results found.  EKG: Orders placed or performed in visit on 08/27/15   EKG 12-Lead     Hospital Course: Crystal Little is a 83 y.o. who was admitted to [facility]. They were brought to the operating room on 08/13/2024 and underwent Procedure(s): ARTHROPLASTY, KNEE, TOTAL.  Patient tolerated the procedure well and was later transferred to the recovery room and  then to the orthopaedic floor for postoperative care. They were given PO and IV analgesics for pain control following their surgery. They were given 24 hours of postoperative antibiotics of  Anti-infectives (From admission, onward)    Start     Dose/Rate Route Frequency Ordered Stop   08/13/24 1845  ceFAZolin  (ANCEF ) IVPB 2g/100 mL premix        2 g 200 mL/hr over 30 Minutes Intravenous Every 6 hours 08/13/24 1608 08/14/24 0204   08/13/24 1045  ceFAZolin  (ANCEF ) IVPB 2g/100 mL premix        2 g 200 mL/hr over 30 Minutes Intravenous On call to O.R. 08/13/24 1034 08/13/24 1243      and started on DVT prophylaxis in the form of Aspirin .   PT and OT were ordered for total joint protocol. Discharge planning consulted to help with postop disposition and equipment needs. Patient had a good night on the evening of surgery. They started to get up OOB with therapy on POD #0. Continued to work with therapy into POD #2. Pt was seen during rounds on day two and was ready for discharge pending bed availability. Dressing was changed and the incision was clean, dry and intact.  She was discharged to home later that day in stable condition.  Diet: Regular diet Activity: WBAT Follow-up: in 2 weeks Disposition: Skilled nursing facility Discharged Condition: stable   Discharge Instructions     Call MD / Call 911   Complete by: As directed    If you experience chest pain or shortness of breath, CALL 911 and be transported to the hospital emergency room.  If you develope a fever above 101 F, pus (white drainage) or increased drainage or redness at the wound, or calf pain, call your surgeon's office.   Change dressing   Complete by: As directed    You may remove the bulky bandage (ACE wrap and gauze) two days after surgery. You will have an adhesive waterproof bandage underneath. Leave this in place until your first follow-up appointment.   Constipation Prevention   Complete by: As directed    Drink plenty  of fluids.  Prune juice may be helpful.  You may use a stool softener, such as Colace (over the counter) 100 mg twice a day.  Use MiraLax  (over the counter) for constipation as needed.   Diet - low sodium heart healthy   Complete by: As directed    Do not put a pillow under the knee. Place it under the heel.   Complete by: As directed    Driving restrictions   Complete by: As directed    No driving for two weeks   Post-operative opioid taper instructions:   Complete by: As directed    POST-OPERATIVE OPIOID TAPER INSTRUCTIONS: It is important to wean off of your opioid medication as soon as possible. If you do not need pain medication after your surgery it is ok to stop day one. Opioids include: Codeine, Hydrocodone (Norco, Vicodin), Oxycodone (Percocet, oxycontin ) and hydromorphone  amongst others.  Long term and even short term use of opiods can cause: Increased pain response Dependence Constipation Depression Respiratory depression And more.  Withdrawal symptoms can include Flu like symptoms Nausea, vomiting And more Techniques to manage these symptoms Hydrate well Eat regular healthy meals Stay active Use relaxation techniques(deep breathing, meditating, yoga) Do Not substitute Alcohol to help with tapering If you have been on opioids for less than two weeks and do not have pain than it is ok to stop all together.  Plan to wean off of opioids This plan should start within one week post op of your joint replacement. Maintain the same interval or time between taking each dose and first decrease the dose.  Cut the total daily intake of opioids by one tablet each day Next start to  increase the time between doses. The last dose that should be eliminated is the evening dose.      TED hose   Complete by: As directed    Use stockings (TED hose) for three weeks on both leg(s).  You may remove them at night for sleeping.   Weight bearing as tolerated   Complete by: As directed        Allergies as of 08/15/2024       Reactions   Amoxicillin     Unknown reaction         Medication List     STOP taking these medications    ibuprofen 200 MG tablet Commonly known as: ADVIL       TAKE these medications    ALPRAZolam  0.25 MG tablet Commonly known as: XANAX  Take 1 tablet (0.25 mg total) by mouth at bedtime. What changed:  when to take this reasons to take this   aspirin  81 MG chewable tablet Chew 1 tablet (81 mg total) by mouth 2 (two) times daily for 21 days. Then take one 81 mg aspirin  once a day for three weeks. Then discontinue aspirin .   azithromycin  250 MG tablet Commonly known as: Zithromax  Z-Pak As directed   bisacodyl  10 MG suppository Commonly known as: DULCOLAX Place 1 suppository (10 mg total) rectally daily as needed for moderate constipation.   cholecalciferol 25 MCG (1000 UNIT) tablet Commonly known as: VITAMIN D3 Take 1,000 Units by mouth daily.   cyclobenzaprine  10 MG tablet Commonly known as: FLEXERIL  Take 1 tablet (10 mg total) by mouth 3 (three) times daily as needed for muscle spasms.   fluticasone 50 MCG/ACT nasal spray Commonly known as: FLONASE Place 1 spray into both nostrils daily as needed (nasal congestion.).   Hydrocortisone 2 % Crea Apply 1 Application topically daily as needed (leg itching).   Iron  (Ferrous Sulfate ) 325 (65 Fe) MG Tabs Take 325 mg by mouth daily.   omeprazole  40 MG capsule Commonly known as: PRILOSEC Take 1 capsule (40 mg total) by mouth daily.   ondansetron  4 MG tablet Commonly known as: ZOFRAN  Take 1 tablet (4 mg total) by mouth every 6 (six) hours as needed for nausea.   ondansetron  4 MG/2ML Soln injection Commonly known as: ZOFRAN  Inject 2 mLs (4 mg total) into the vein every 6 (six) hours as needed for nausea.   oxyCODONE  5 MG immediate release tablet Commonly known as: Oxy IR/ROXICODONE  Take 1 tablet (5 mg total) by mouth every 4 (four) hours as needed for severe pain (pain  score 7-10).   polyethylene glycol 17 g packet Commonly known as: MIRALAX  / GLYCOLAX  Take 17 g by mouth daily as needed for mild constipation.   THERATEARS OP Place 1 drop into both eyes 3 (three) times daily as needed (dry/irritated eyes.).   torsemide  20 MG tablet Commonly known as: DEMADEX  Take 2 tablets (40 mg total) by mouth daily.   traMADol  50 MG tablet Commonly known as: ULTRAM  Take 1-2 tablets (50-100 mg total) by mouth every 6 (six) hours as needed for moderate pain (pain score 4-6).   VIACTIV CALCIUM PLUS D PO Take 1 tablet by mouth daily.               Discharge Care Instructions  (From admission, onward)           Start     Ordered   08/15/24 0000  Weight bearing as tolerated        08/15/24 0749   08/15/24  0000  Change dressing       Comments: You may remove the bulky bandage (ACE wrap and gauze) two days after surgery. You will have an adhesive waterproof bandage underneath. Leave this in place until your first follow-up appointment.   08/15/24 0749            Follow-up Information     Melodi Lerner, MD. Go on 08/29/2024.   Specialty: Orthopedic Surgery Why: You are scheduled for a post op appointment on Wednesday 08/29/24 at 2:30pm Contact information: [address] [place] [address] [phone]                 Signed: Roxie Mess, PA-C Orthopedic Surgery 08/15/2024, 7:49 AM

## 2024-08-15 NOTE — Plan of Care (Signed)
  Problem: Activity: Goal: Risk for activity intolerance will decrease Outcome: Progressing   Problem: Nutrition: Goal: Adequate nutrition will be maintained Outcome: Progressing   Problem: Coping: Goal: Level of anxiety will decrease Outcome: Progressing   Problem: Elimination: Goal: Will not experience complications related to urinary retention Outcome: Progressing   Problem: Pain Managment: Goal: General experience of comfort will improve and/or be controlled Outcome: Progressing   Problem: Skin Integrity: Goal: Risk for impaired skin integrity will decrease Outcome: Progressing

## 2024-08-15 NOTE — Progress Notes (Signed)
 Physical Therapy Treatment Patient Details Name: Crystal Little MRN: [id number] DOB: 16-Jan-1941 Today's Date: 08/15/2024   History of Present Illness 83 yo female s/p R TKA on 08/13/24. Lymphedema, venous insufficiency, anemia, patellar tendon repair L knee 10/2023, IBS, PVD, anxiety,  diverticulosis, back surgery    PT Comments  Pt progressing slowly, activity progression limited by pain despite being premedicated for session. Pt was able to amb ~ 6' with RW and min assist, chair follow for safety. Pt's dtr reports pt was very confused last night, pt is slightly lethargic this morning, likely d/t pain meds. RN notified. Continue PT in acute setting; d/c plan remains appropriate.    If plan is discharge home, recommend the following: A little help with walking and/or transfers;A little help with bathing/dressing/bathroom;Help with stairs or ramp for entrance;Assist for transportation;Assistance with cooking/housework   Can travel by private vehicle     No  Equipment Recommendations  None recommended by PT    Recommendations for Other Services       Precautions / Restrictions Precautions Precautions: Knee;Fall Recall of Precautions/Restrictions: Intact Knee Immobilizer - Right: Discontinue once straight leg raise with < 10 degree lag Restrictions Weight Bearing Restrictions Per Provider Order: No     Mobility  Bed Mobility               General bed mobility comments: pt in recliner, returned to same    Transfers Overall transfer level: Needs assistance Equipment used: Rolling walker (2 wheels) Transfers: Sit to/from Stand Sit to Stand: Min assist, Mod assist           General transfer comment: incr time, assist to rise shift wt anteriorly and transition to RW; cues for hand placement, trunk extension and bil LE position    Ambulation/Gait Ambulation/Gait assistance: Min assist, +2 safety/equipment Gait Distance (Feet): 6 Feet Assistive device: Rolling  walker (2 wheels) Gait Pattern/deviations: Step-to pattern, Trunk flexed Gait velocity: decr     General Gait Details: cues for sequence, trunk extension, RW position and step length; distance limited by pain   Stairs             Wheelchair Mobility     Tilt Bed    Modified Rankin (Stroke Patients Only)       Balance                                            Communication Communication Communication: No apparent difficulties;Impaired Factors Affecting Communication: Hearing impaired  Cognition Arousal: Alert Behavior During Therapy: WFL for tasks assessed/performed   PT - Cognitive impairments: No apparent impairments                       PT - Cognition Comments: very sleepy Following commands: Intact      Cueing    Exercises Total Joint Exercises Ankle Circles/Pumps: AROM, Both, 10 reps Quad Sets: AROM, Both (7 reps) Heel Slides: AAROM, Right, 10 reps, Limitations Heel Slides Limitations: pain, muscle guarding Straight Leg Raises: AAROM, Right (7 reps)    General Comments        Pertinent Vitals/Pain Pain Assessment Pain Assessment: Faces Faces Pain Scale: Hurts whole lot Pain Location: right knee Pain Descriptors / Indicators: Sore, Grimacing Pain Intervention(s): Limited activity within patient's tolerance, Monitored during session, Premedicated before session, Repositioned, Ice applied    Home Living  Prior Function            PT Goals (current goals can now be found in the care plan section) Acute Rehab PT Goals Patient Stated Goal: be able to do more, walk bettter PT Goal Formulation: With patient Time For Goal Achievement: 08/28/24 Potential to Achieve Goals: Good Progress towards PT goals: Progressing toward goals    Frequency    7X/week      PT Plan      Co-evaluation              AM-PAC PT 6 Clicks Mobility   Outcome Measure  Help needed  turning from your back to your side while in a flat bed without using bedrails?: A Little Help needed moving from lying on your back to sitting on the side of a flat bed without using bedrails?: A Little Help needed moving to and from a bed to a chair (including a wheelchair)?: A Lot Help needed standing up from a chair using your arms (e.g., wheelchair or bedside chair)?: A Lot Help needed to walk in hospital room?: A Lot Help needed climbing 3-5 steps with a railing? : Total 6 Click Score: 13    End of Session Equipment Utilized During Treatment: Gait belt Activity Tolerance: Patient limited by pain Patient left: in chair;with call bell/phone within reach;with chair alarm set Nurse Communication: Mobility status PT Visit Diagnosis: Other abnormalities of gait and mobility (R26.89)     Time: 8959-8877 PT Time Calculation (min) (ACUTE ONLY): 42 min  Charges:    $Gait Training: 8-22 mins $Therapeutic Exercise: 8-22 mins $Therapeutic Activity: 8-22 mins PT General Charges $$ ACUTE PT VISIT: 1 Visit                     Rexene, PT  Acute Rehab Dept [facility]) [phone]  08/15/2024    [facility] 08/15/2024, 1:55 PM

## 2024-08-16 DIAGNOSIS — M1711 Unilateral primary osteoarthritis, right knee: Secondary | ICD-10-CM | POA: Diagnosis not present

## 2024-08-16 NOTE — Progress Notes (Signed)
 Physical Therapy Treatment Patient Details Name: Crystal Little MRN: [id number] DOB: [birth year] Today's Date: 08/16/2024   History of Present Illness 83 yo female s/p R TKA on 08/13/24. Lymphedema, venous insufficiency, anemia, patellar tendon repair L knee 10/2023, IBS, PVD, anxiety,  diverticulosis, back surgery    PT Comments  Pt  making steady gains this session. incr gait distance ~ 38' with RW and min assist, step length and wt shift to RLE improving; continuing to work on R knee flexion, pt reluctant to flex knee, anxious d/t difficult recover and quad tendon tear after L TKA.    If plan is discharge home, recommend the following: A little help with walking and/or transfers;A little help with bathing/dressing/bathroom;Help with stairs or ramp for entrance;Assist for transportation;Assistance with cooking/housework   Can travel by private vehicle     No  Equipment Recommendations  None recommended by PT    Recommendations for Other Services       Precautions / Restrictions Precautions Precautions: Knee;Fall Recall of Precautions/Restrictions: Intact Precaution/Restrictions Comments: did not utilize KI this session Knee Immobilizer - Right: Discontinue once straight leg raise with < 10 degree lag Restrictions Weight Bearing Restrictions Per Provider Order: No     Mobility  Bed Mobility               General bed mobility comments: pt in recliner and returned to same    Transfers Overall transfer level: Needs assistance Equipment used: Rolling walker (2 wheels) Transfers: Sit to/from Stand Sit to Stand: Mod assist, +2 safety/equipment           General transfer comment: incr time, assist to rise shift wt anteriorly and transition to RW; cues for hand placement, wt shift,  trunk extension and bil LE position    Ambulation/Gait Ambulation/Gait assistance: Min assist, +2 safety/equipment Gait Distance (Feet): 40 Feet Assistive device: Rolling walker (2  wheels) Gait Pattern/deviations: Step-to pattern, Trunk flexed Gait velocity: decr     General Gait Details: ongoing education for sequence, trunk extension, RW position and step length;   wt shift to RLE improved, beginning  step through pattern  end of distance   Stairs             Wheelchair Mobility     Tilt Bed    Modified Rankin (Stroke Patients Only)       Balance                                            Communication Communication Communication: No apparent difficulties;Impaired Factors Affecting Communication: Hearing impaired  Cognition Arousal: Alert Behavior During Therapy: WFL for tasks assessed/performed   PT - Cognitive impairments: No apparent impairments                         Following commands: Intact      Cueing Cueing Techniques: Verbal cues, Tactile cues  Exercises Total Joint Exercises Ankle Circles/Pumps: AROM, Both, 15 reps Quad Sets: AROM, Both, 10 reps Heel Slides: AAROM, Right, 10 reps, Limitations Heel Slides Limitations: pain, muscle guarding    General Comments        Pertinent Vitals/Pain Pain Assessment Pain Assessment: 0-10 Pain Score: 4  Pain Location: right knee Pain Descriptors / Indicators: Sore Pain Intervention(s): Limited activity within patient's tolerance, Monitored during session, Premedicated before session, Repositioned, Ice applied  Home Living                          Prior Function            PT Goals (current goals can now be found in the care plan section) Acute Rehab PT Goals Patient Stated Goal: be able to do more, walk bettter PT Goal Formulation: With patient Time For Goal Achievement: 08/28/24 Potential to Achieve Goals: Good Progress towards PT goals: Progressing toward goals    Frequency    7X/week      PT Plan      Co-evaluation              AM-PAC PT 6 Clicks Mobility   Outcome Measure  Help needed turning from your  back to your side while in a flat bed without using bedrails?: A Lot Help needed moving from lying on your back to sitting on the side of a flat bed without using bedrails?: A Lot Help needed moving to and from a bed to a chair (including a wheelchair)?: A Lot Help needed standing up from a chair using your arms (e.g., wheelchair or bedside chair)?: A Lot Help needed to walk in hospital room?: A Lot Help needed climbing 3-5 steps with a railing? : Total 6 Click Score: 11    End of Session Equipment Utilized During Treatment: Gait belt Activity Tolerance: Patient tolerated treatment well Patient left: with call bell/phone within reach;with family/visitor present;in chair;with chair alarm set Nurse Communication: Mobility status PT Visit Diagnosis: Other abnormalities of gait and mobility (R26.89)     Time: 8950-8880 PT Time Calculation (min) (ACUTE ONLY): 30 min  Charges:    $Gait Training: 8-22 mins $Therapeutic Exercise: 8-22 mins PT General Charges $$ ACUTE PT VISIT: 1 Visit                        [facility] 08/16/2024, 12:25 PM

## 2024-08-28 DIAGNOSIS — I872 Venous insufficiency (chronic) (peripheral): Secondary | ICD-10-CM | POA: Diagnosis not present

## 2024-08-28 DIAGNOSIS — K219 Gastro-esophageal reflux disease without esophagitis: Secondary | ICD-10-CM | POA: Diagnosis not present

## 2024-08-28 DIAGNOSIS — F419 Anxiety disorder, unspecified: Secondary | ICD-10-CM | POA: Diagnosis not present

## 2024-08-28 DIAGNOSIS — Z471 Aftercare following joint replacement surgery: Secondary | ICD-10-CM | POA: Diagnosis not present

## 2024-08-28 DIAGNOSIS — M6281 Muscle weakness (generalized): Secondary | ICD-10-CM | POA: Diagnosis not present

## 2024-08-28 DIAGNOSIS — R2681 Unsteadiness on feet: Secondary | ICD-10-CM | POA: Diagnosis not present

## 2024-09-03 ENCOUNTER — Inpatient Hospital Stay (HOSPITAL_COMMUNITY)
Admission: EM | Admit: 2024-09-03 | Discharge: 2024-09-08 | DRG: 988 | Disposition: A | Discharge status: Skilled Nursing Facility | Source: Ambulatory Visit | Attending: Internal Medicine | Admitting: Internal Medicine

## 2024-09-03 ENCOUNTER — Encounter (HOSPITAL_COMMUNITY): Payer: Self-pay

## 2024-09-03 ENCOUNTER — Other Ambulatory Visit: Payer: Self-pay

## 2024-09-03 ENCOUNTER — Emergency Department (HOSPITAL_COMMUNITY)

## 2024-09-03 ENCOUNTER — Emergency Department (HOSPITAL_COMMUNITY): Admit: 2024-09-03 | Discharge: 2024-09-03 | Disposition: A

## 2024-09-03 ENCOUNTER — Telehealth: Payer: Self-pay

## 2024-09-03 DIAGNOSIS — N179 Acute kidney failure, unspecified: Secondary | ICD-10-CM

## 2024-09-03 DIAGNOSIS — N189 Chronic kidney disease, unspecified: Secondary | ICD-10-CM | POA: Diagnosis present

## 2024-09-03 DIAGNOSIS — L03115 Cellulitis of right lower limb: Secondary | ICD-10-CM | POA: Diagnosis not present

## 2024-09-03 DIAGNOSIS — Z66 Do not resuscitate: Secondary | ICD-10-CM | POA: Diagnosis present

## 2024-09-03 DIAGNOSIS — R531 Weakness: Secondary | ICD-10-CM

## 2024-09-03 DIAGNOSIS — Z751 Person awaiting admission to adequate facility elsewhere: Secondary | ICD-10-CM | POA: Diagnosis not present

## 2024-09-03 DIAGNOSIS — D509 Iron deficiency anemia, unspecified: Secondary | ICD-10-CM

## 2024-09-03 DIAGNOSIS — M7989 Other specified soft tissue disorders: Secondary | ICD-10-CM

## 2024-09-03 DIAGNOSIS — D631 Anemia in chronic kidney disease: Secondary | ICD-10-CM | POA: Diagnosis present

## 2024-09-03 DIAGNOSIS — Z833 Family history of diabetes mellitus: Secondary | ICD-10-CM | POA: Diagnosis not present

## 2024-09-03 DIAGNOSIS — I872 Venous insufficiency (chronic) (peripheral): Secondary | ICD-10-CM | POA: Diagnosis present

## 2024-09-03 DIAGNOSIS — Z7982 Long term (current) use of aspirin: Secondary | ICD-10-CM | POA: Diagnosis not present

## 2024-09-03 DIAGNOSIS — R339 Retention of urine, unspecified: Secondary | ICD-10-CM | POA: Diagnosis present

## 2024-09-03 DIAGNOSIS — E876 Hypokalemia: Secondary | ICD-10-CM

## 2024-09-03 DIAGNOSIS — E66812 Obesity, class 2: Secondary | ICD-10-CM

## 2024-09-03 DIAGNOSIS — Y92009 Unspecified place in unspecified non-institutional (private) residence as the place of occurrence of the external cause: Secondary | ICD-10-CM | POA: Diagnosis not present

## 2024-09-03 DIAGNOSIS — S86819A Strain of other muscle(s) and tendon(s) at lower leg level, unspecified leg, initial encounter: Secondary | ICD-10-CM | POA: Diagnosis present

## 2024-09-03 DIAGNOSIS — X58XXXA Exposure to other specified factors, initial encounter: Secondary | ICD-10-CM | POA: Diagnosis present

## 2024-09-03 DIAGNOSIS — K219 Gastro-esophageal reflux disease without esophagitis: Secondary | ICD-10-CM | POA: Diagnosis present

## 2024-09-03 DIAGNOSIS — Z6835 Body mass index (BMI) 35.0-35.9, adult: Secondary | ICD-10-CM | POA: Diagnosis not present

## 2024-09-03 DIAGNOSIS — E860 Dehydration: Secondary | ICD-10-CM | POA: Diagnosis present

## 2024-09-03 DIAGNOSIS — Z96653 Presence of artificial knee joint, bilateral: Secondary | ICD-10-CM | POA: Diagnosis present

## 2024-09-03 DIAGNOSIS — I739 Peripheral vascular disease, unspecified: Secondary | ICD-10-CM | POA: Diagnosis present

## 2024-09-03 DIAGNOSIS — I890 Lymphedema, not elsewhere classified: Secondary | ICD-10-CM | POA: Diagnosis present

## 2024-09-03 DIAGNOSIS — S86811A Strain of other muscle(s) and tendon(s) at lower leg level, right leg, initial encounter: Secondary | ICD-10-CM | POA: Diagnosis not present

## 2024-09-03 DIAGNOSIS — Z6833 Body mass index (BMI) 33.0-33.9, adult: Secondary | ICD-10-CM | POA: Diagnosis not present

## 2024-09-03 DIAGNOSIS — Z79899 Other long term (current) drug therapy: Secondary | ICD-10-CM | POA: Diagnosis not present

## 2024-09-03 DIAGNOSIS — S76111A Strain of right quadriceps muscle, fascia and tendon, initial encounter: Secondary | ICD-10-CM | POA: Diagnosis present

## 2024-09-03 DIAGNOSIS — F419 Anxiety disorder, unspecified: Secondary | ICD-10-CM | POA: Diagnosis present

## 2024-09-03 DIAGNOSIS — Z8249 Family history of ischemic heart disease and other diseases of the circulatory system: Secondary | ICD-10-CM | POA: Diagnosis not present

## 2024-09-03 LAB — CBC WITH DIFFERENTIAL/PLATELET
Abs Immature Granulocytes: 0.05 K/uL (ref 0.00–0.07)
Basophils Absolute: 0.1 K/uL (ref 0.0–0.1)
Basophils Relative: 1 %
Eosinophils Absolute: 0.2 K/uL (ref 0.0–0.5)
Eosinophils Relative: 2 %
HCT: 33.1 % — ABNORMAL LOW (ref 36.0–46.0)
Hemoglobin: 10.5 g/dL — ABNORMAL LOW (ref 12.0–15.0)
Immature Granulocytes: 1 %
Lymphocytes Relative: 18 %
Lymphs Abs: 1.8 K/uL (ref 0.7–4.0)
MCH: 29.2 pg (ref 26.0–34.0)
MCHC: 31.7 g/dL (ref 30.0–36.0)
MCV: 91.9 fL (ref 80.0–100.0)
Monocytes Absolute: 0.9 K/uL (ref 0.1–1.0)
Monocytes Relative: 10 %
Neutro Abs: 6.8 K/uL (ref 1.7–7.7)
Neutrophils Relative %: 68 %
Platelets: 484 K/uL — ABNORMAL HIGH (ref 150–400)
RBC: 3.6 MIL/uL — ABNORMAL LOW (ref 3.87–5.11)
RDW: 14.5 % (ref 11.5–15.5)
WBC: 9.8 K/uL (ref 4.0–10.5)
nRBC: 0 % (ref 0.0–0.2)

## 2024-09-03 LAB — COMPREHENSIVE METABOLIC PANEL WITH GFR
ALT: 7 U/L (ref 0–44)
AST: 19 U/L (ref 15–41)
Albumin: 4.1 g/dL (ref 3.5–5.0)
Alkaline Phosphatase: 131 U/L — ABNORMAL HIGH (ref 38–126)
Anion gap: 12 (ref 5–15)
BUN: 29 mg/dL — ABNORMAL HIGH (ref 8–23)
CO2: 35 mmol/L — ABNORMAL HIGH (ref 22–32)
Calcium: 10.2 mg/dL (ref 8.9–10.3)
Chloride: 90 mmol/L — ABNORMAL LOW (ref 98–111)
Creatinine, Ser: 1.28 mg/dL — ABNORMAL HIGH (ref 0.44–1.00)
GFR, Estimated: 41 mL/min — ABNORMAL LOW (ref >60)
Glucose, Bld: 94 mg/dL (ref 70–99)
Potassium: 3.1 mmol/L — ABNORMAL LOW (ref 3.5–5.1)
Sodium: 137 mmol/L (ref 135–145)
Total Bilirubin: 0.9 mg/dL (ref 0.0–1.2)
Total Protein: 8.2 g/dL — ABNORMAL HIGH (ref 6.5–8.1)

## 2024-09-03 LAB — TROPONIN T, HIGH SENSITIVITY: Troponin T High Sensitivity: 18 ng/L (ref 0–19)

## 2024-09-03 LAB — CG4 I-STAT (LACTIC ACID): Lactic Acid, Venous: 1.4 mmol/L (ref 0.5–1.9)

## 2024-09-03 MED ORDER — ALPRAZOLAM 0.25 MG PO TABS: 0.2500 mg | ORAL_TABLET | Freq: Every evening | ORAL | Status: DC | PRN

## 2024-09-03 MED ORDER — ENSURE PLUS HIGH PROTEIN PO LIQD
237.0000 mL | Freq: Three times a day (TID) | ORAL | Status: DC
  Administered 2024-09-06 – 2024-09-08 (×4): 237 mL via ORAL
  Filled 2024-09-03: qty 237

## 2024-09-03 MED ORDER — VANCOMYCIN HCL 1750 MG/350ML IV SOLN
1750.0000 mg | Freq: Once | INTRAVENOUS | Status: AC
  Administered 2024-09-03: 1750 mg via INTRAVENOUS
  Filled 2024-09-03: qty 350

## 2024-09-03 MED ORDER — LACTATED RINGERS IV BOLUS
500.0000 mL | Freq: Once | INTRAVENOUS | Status: AC
  Administered 2024-09-03: 500 mL via INTRAVENOUS

## 2024-09-03 MED ORDER — FERROUS SULFATE 325 (65 FE) MG PO TABS
325.0000 mg | ORAL_TABLET | Freq: Every day | ORAL | Status: DC
  Administered 2024-09-04 – 2024-09-08 (×4): 325 mg via ORAL
  Filled 2024-09-03 (×5): qty 1

## 2024-09-03 MED ORDER — TORSEMIDE 20 MG PO TABS
40.0000 mg | ORAL_TABLET | Freq: Every day | ORAL | Status: DC
  Administered 2024-09-04: 40 mg via ORAL
  Filled 2024-09-03: qty 2

## 2024-09-03 MED ORDER — POTASSIUM CHLORIDE CRYS ER 20 MEQ PO TBCR
40.0000 meq | EXTENDED_RELEASE_TABLET | Freq: Once | ORAL | Status: AC
  Administered 2024-09-03: 40 meq via ORAL
  Filled 2024-09-03: qty 2

## 2024-09-03 MED ORDER — ACETAMINOPHEN 325 MG PO TABS: 650.0000 mg | ORAL_TABLET | Freq: Four times a day (QID) | ORAL | Status: DC | PRN

## 2024-09-03 MED ORDER — PANTOPRAZOLE SODIUM 40 MG PO TBEC
40.0000 mg | DELAYED_RELEASE_TABLET | Freq: Every day | ORAL | Status: DC
  Administered 2024-09-04 – 2024-09-08 (×4): 40 mg via ORAL
  Filled 2024-09-03 (×5): qty 1

## 2024-09-03 MED ORDER — ONDANSETRON HCL 4 MG PO TABS
4.0000 mg | ORAL_TABLET | Freq: Four times a day (QID) | ORAL | Status: DC | PRN
  Filled 2024-09-03: qty 1

## 2024-09-03 MED ORDER — SENNOSIDES-DOCUSATE SODIUM 8.6-50 MG PO TABS: 1.0000 | ORAL_TABLET | Freq: Every evening | ORAL | Status: DC | PRN

## 2024-09-03 MED ORDER — ENOXAPARIN SODIUM 40 MG/0.4ML IJ SOSY
40.0000 mg | PREFILLED_SYRINGE | INTRAMUSCULAR | Status: DC
  Administered 2024-09-03 – 2024-09-04 (×2): 40 mg via SUBCUTANEOUS
  Filled 2024-09-03 (×2): qty 0.4

## 2024-09-03 MED ORDER — ACETAMINOPHEN 500 MG PO TABS
1000.0000 mg | ORAL_TABLET | Freq: Three times a day (TID) | ORAL | Status: DC
  Administered 2024-09-03 – 2024-09-04 (×4): 1000 mg via ORAL
  Filled 2024-09-03 (×5): qty 2

## 2024-09-03 MED ORDER — LINEZOLID 600 MG/300ML IV SOLN
600.0000 mg | Freq: Two times a day (BID) | INTRAVENOUS | Status: DC
  Administered 2024-09-04 – 2024-09-08 (×8): 600 mg via INTRAVENOUS
  Filled 2024-09-03 (×8): qty 300

## 2024-09-03 MED ORDER — ONDANSETRON HCL 4 MG/2ML IJ SOLN: 4.0000 mg | Freq: Four times a day (QID) | INTRAMUSCULAR | Status: DC | PRN

## 2024-09-03 MED ORDER — LACTATED RINGERS IV SOLN: INTRAVENOUS | Status: DC

## 2024-09-03 MED ORDER — BISACODYL 5 MG PO TBEC: 5.0000 mg | DELAYED_RELEASE_TABLET | Freq: Every day | ORAL | Status: DC | PRN

## 2024-09-03 MED ORDER — OXYCODONE HCL 5 MG PO TABS
5.0000 mg | ORAL_TABLET | Freq: Four times a day (QID) | ORAL | Status: DC | PRN
  Administered 2024-09-03 – 2024-09-04 (×3): 5 mg via ORAL
  Filled 2024-09-03 (×4): qty 1

## 2024-09-03 MED ORDER — SENNOSIDES-DOCUSATE SODIUM 8.6-50 MG PO TABS
1.0000 | ORAL_TABLET | Freq: Every day | ORAL | Status: DC
  Administered 2024-09-03 – 2024-09-06 (×3): 1 via ORAL
  Filled 2024-09-03 (×3): qty 1

## 2024-09-03 MED ORDER — ACETAMINOPHEN 650 MG RE SUPP: 650.0000 mg | Freq: Four times a day (QID) | RECTAL | Status: DC | PRN

## 2024-09-03 NOTE — ED Provider Notes (Signed)
 [place] EMERGENCY DEPARTMENT AT [facility] Provider Note   CSN: [id number] Arrival date & time: 09/03/24  1541     Patient presents with: Weakness   Crystal Little is a 83 y.o. female.   83 year old female presenting emergency department with right knee pain and generalized weakness.  Had knee replacement on November 10 went to rehab.  Was discharged this past Wednesday.  Was ambulatory with with aid of walker.  Friday had increased pain to her right knee and has essentially not been able to get out of the bed without max assist per caretaker.  Has had decreased appetite.  Caretaker notes decreased urine output.  Denies fevers chills chest pain, cough, shortness of breath, no abdominal pain.  Had some constipation, but took MiraLAX  and had large bowel movement yesterday.  Home health nurse noted some redness to incision site of the knee today.   Weakness      Prior to Admission medications   Medication Sig Start Date End Date Taking? Authorizing Provider  methocarbamol  (ROBAXIN ) 500 MG tablet Take 500 mg by mouth 3 (three) times daily as needed for muscle spasms. 08/29/24  Yes [provider]  ALPRAZolam  (XANAX ) 0.25 MG tablet Take 1 tablet (0.25 mg total) by mouth at bedtime. Patient taking differently: Take 0.25 mg by mouth at bedtime as needed for anxiety or sleep. 06/20/24   Johnny Garnette LABOR, MD  aspirin  81 MG chewable tablet Chew 1 tablet (81 mg total) by mouth 2 (two) times daily for 21 days. Then take one 81 mg aspirin  once a day for three weeks. Then discontinue aspirin . 08/15/24 09/05/24  Edmisten, Roxie CROME, PA  bisacodyl  (DULCOLAX) 10 MG suppository Place 1 suppository (10 mg total) rectally daily as needed for moderate constipation. 08/15/24   Edmisten, Kristie L, PA  Calcium-Vitamin D -Vitamin K (VIACTIV CALCIUM PLUS D PO) Take 1 tablet by mouth daily.    [provider]  Carboxymethylcellulose Sodium (THERATEARS OP) Place 1 drop into both  eyes 3 (three) times daily as needed (dry/irritated eyes.).    [provider]  cholecalciferol (VITAMIN D3) 25 MCG (1000 UNIT) tablet Take 1,000 Units by mouth daily.    [provider]  cyclobenzaprine  (FLEXERIL ) 10 MG tablet Take 1 tablet (10 mg total) by mouth 3 (three) times daily as needed for muscle spasms. 08/15/24   Edmisten, Kristie L, PA  fluticasone (FLONASE) 50 MCG/ACT nasal spray Place 1 spray into both nostrils daily as needed (nasal congestion.).    [provider]  Hydrocortisone 2 % CREA Apply 1 Application topically daily as needed (leg itching).    [provider]  Iron , Ferrous Sulfate , 325 (65 Fe) MG TABS Take 325 mg by mouth daily. 06/20/24   Johnny Garnette LABOR, MD  omeprazole  (PRILOSEC) 40 MG capsule Take 1 capsule (40 mg total) by mouth daily. 06/20/24   Johnny Garnette LABOR, MD  ondansetron  (ZOFRAN ) 4 MG tablet Take 1 tablet (4 mg total) by mouth every 6 (six) hours as needed for nausea. 08/15/24   Edmisten, Kristie L, PA  oxyCODONE  (OXY IR/ROXICODONE ) 5 MG immediate release tablet Take 1 tablet (5 mg total) by mouth every 4 (four) hours as needed for severe pain (pain score 7-10). 08/15/24   Edmisten, Kristie L, PA  polyethylene glycol (MIRALAX  / GLYCOLAX ) 17 g packet Take 17 g by mouth daily as needed for mild constipation. 08/15/24   Edmisten, Kristie L, PA  torsemide  (DEMADEX ) 20 MG tablet Take 2 tablets (40 mg  total) by mouth daily. 06/20/24   Johnny Garnette LABOR, MD  traMADol  (ULTRAM ) 50 MG tablet Take 1-2 tablets (50-100 mg total) by mouth every 6 (six) hours as needed for moderate pain (pain score 4-6). 08/15/24   Edmisten, Roxie CROME, PA    Allergies: Amoxicillin     Review of Systems  Neurological:  Positive for weakness.    Updated Vital Signs BP (!) 139/55 (BP Location: Left Arm)   Pulse 85   Temp 99 F (37.2 C) (Oral)   Resp 15   Ht 5' 3 (1.6 m)   Wt 90 kg   SpO2 98%   BMI 35.15 kg/m   Physical Exam Vitals and nursing note  reviewed.  Constitutional:      General: She is not in acute distress.    Appearance: She is not toxic-appearing.  HENT:     Head: Normocephalic.     Nose: Nose normal.     Mouth/Throat:     Mouth: Mucous membranes are dry.     Pharynx: Oropharynx is clear.  Eyes:     Extraocular Movements: Extraocular movements intact.     Pupils: Pupils are equal, round, and reactive to light.  Cardiovascular:     Rate and Rhythm: Normal rate and regular rhythm.  Pulmonary:     Effort: Pulmonary effort is normal.     Breath sounds: Normal breath sounds.  Abdominal:     General: Abdomen is flat. There is no distension.     Tenderness: There is no abdominal tenderness. There is no guarding or rebound.  Musculoskeletal:     Cervical back: Normal range of motion.     Comments: Right knee with surgical scar.  Well-approximated.  No purulent drainage or foul odor.  No crepitus.  Does have erythema to the bottom half of the incision.  There is some global edema to the leg.  Has equal DP pulses.  Soft compartments.  Global diminished strength in plantarflexion dorsiflexion bilaterally.  Normal sensation.  Skin:    General: Skin is dry.     Capillary Refill: Capillary refill takes less than 2 seconds.  Neurological:     Mental Status: She is alert and oriented to person, place, and time.  Psychiatric:        Mood and Affect: Mood normal.        Behavior: Behavior normal.     (all labs ordered are listed, but only abnormal results are displayed) Labs Reviewed  CBC WITH DIFFERENTIAL/PLATELET - Abnormal; Notable for the following components:      Result Value   RBC 3.60 (*)    Hemoglobin 10.5 (*)    HCT 33.1 (*)    Platelets 484 (*)    All other components within normal limits  COMPREHENSIVE METABOLIC PANEL WITH GFR - Abnormal; Notable for the following components:   Potassium 3.1 (*)    Chloride 90 (*)    CO2 35 (*)    BUN 29 (*)    Creatinine, Ser 1.28 (*)    Total Protein 8.2 (*)     Alkaline Phosphatase 131 (*)    GFR, Estimated 41 (*)    All other components within normal limits  CULTURE, BLOOD (ROUTINE X 2)  CULTURE, BLOOD (ROUTINE X 2)  URINALYSIS, ROUTINE W REFLEX MICROSCOPIC  BASIC METABOLIC PANEL WITH GFR  CBC  MAGNESIUM  PHOSPHORUS  I-STAT CG4 LACTIC ACID, ED  TROPONIN T, HIGH SENSITIVITY    EKG: None  Radiology: VAS US  LOWER EXTREMITY VENOUS (DVT) (  ONLY [facility] & [facility]) Result Date: 09/03/2024  Lower Venous DVT Study Patient Name:  JEANITA CARNEIRO  Date of Exam:   09/03/2024 Medical Rec #: [id number]          Accession #:    [id number] Date of Birth: 1941/04/07         Patient Gender: F Patient Age:   25 years Exam Location:  [facility] Procedure:      VAS US  LOWER EXTREMITY VENOUS (DVT) Referring Phys: CARON Flannery Cavallero --------------------------------------------------------------------------------  Indications: Swelling.  Risk Factors: Surgery. Limitations: Poor ultrasound/tissue interface. Comparison Study: No prior studies. Performing Technologist: Cordella Collet RVT  Examination Guidelines: A complete evaluation includes B-mode imaging, spectral Doppler, color Doppler, and power Doppler as needed of all accessible portions of each vessel. Bilateral testing is considered an integral part of a complete examination. Limited examinations for reoccurring indications may be performed as noted. The reflux portion of the exam is performed with the patient in reverse Trendelenburg.  +---------+---------------+---------+-----------+----------+--------------+ RIGHT    CompressibilityPhasicitySpontaneityPropertiesThrombus Aging +---------+---------------+---------+-----------+----------+--------------+ CFV      Full           Yes      Yes                                 +---------+---------------+---------+-----------+----------+--------------+ SFJ      Full                                                         +---------+---------------+---------+-----------+----------+--------------+ FV Prox  Full                                                        +---------+---------------+---------+-----------+----------+--------------+ FV Mid   Full                                                        +---------+---------------+---------+-----------+----------+--------------+ FV Distal               Yes      Yes                                 +---------+---------------+---------+-----------+----------+--------------+ PFV      Full                                                        +---------+---------------+---------+-----------+----------+--------------+ POP      Full           Yes      Yes                                 +---------+---------------+---------+-----------+----------+--------------+ PTV  Full                                                        +---------+---------------+---------+-----------+----------+--------------+ PERO     Full                                                        +---------+---------------+---------+-----------+----------+--------------+   +----+---------------+---------+-----------+----------+--------------+ LEFTCompressibilityPhasicitySpontaneityPropertiesThrombus Aging +----+---------------+---------+-----------+----------+--------------+ CFV Full           Yes      Yes                                 +----+---------------+---------+-----------+----------+--------------+    Summary: RIGHT: - There is no evidence of deep vein thrombosis in the lower extremity. However, portions of this examination were limited- see technologist comments above.  - No cystic structure found in the popliteal fossa.  LEFT: - No evidence of common femoral vein obstruction.   *See table(s) above for measurements and observations. Electronically signed by Fonda Rim on 09/03/2024 at 7:02:53 PM.    Final    DG Knee Right Port Result  Date: 09/03/2024 CLINICAL DATA:  Knee pain EXAM: PORTABLE RIGHT KNEE - 1-2 VIEW COMPARISON:  None Available. 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 There is soft tissue swelling surrounding the knee. There is no evidence for hardware loosening or acute fracture. Patella appears high riding. There is an osseous fragment on the lateral view seen anterior to the distal femur measuring 2.3 x 0.6 cm, indeterminate. IMPRESSION: 1. 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 2. Soft tissue swelling surrounding the knee. 3. Osseous fragment anterior to the distal femur on the lateral view, indeterminate. 4. Patella appears high riding.  Please correlate clinically. Electronically Signed   By: Greig Pique M.D.   On: 09/03/2024 17:29   DG Chest Portable 1 View Result Date: 09/03/2024 CLINICAL DATA:  Postoperative infection EXAM: PORTABLE CHEST 1 VIEW COMPARISON:  None Available. FINDINGS: The heart size and mediastinal contours are within normal limits. Both lungs are clear. The visualized skeletal structures are unremarkable. IMPRESSION: No active disease. Electronically Signed   By: Greig Pique M.D.   On: 09/03/2024 17:19     Procedures   Medications Ordered in the ED  bisacodyl  (DULCOLAX) EC tablet 5 mg (has no administration in time range)  ondansetron  (ZOFRAN ) tablet 4 mg (has no administration in time range)    Or  ondansetron  (ZOFRAN ) injection 4 mg (has no administration in time range)  enoxaparin (LOVENOX) injection 40 mg (has no administration in time range)  acetaminophen  (TYLENOL ) tablet 1,000 mg (1,000 mg Oral Given 09/03/24 2142)  lactated ringers  infusion ( Intravenous New Bag/Given 09/03/24 2149)  feeding supplement (ENSURE PLUS HIGH PROTEIN) liquid 237 mL (237 mLs Oral Patient Refused/Not Given 09/03/24 2150)  linezolid (ZYVOX) IVPB 600 mg (has no administration in time range)  potassium chloride SA (KLOR-CON M) CR tablet 40 mEq (has no administration in time  range)  oxyCODONE  (Oxy IR/ROXICODONE ) immediate release tablet 5 mg (5 mg Oral Given 09/03/24 2146)  senna-docusate (Senokot-S) tablet 1 tablet (has no administration  in time range)  torsemide  (DEMADEX ) tablet 40 mg (has no administration in time range)  ALPRAZolam  (XANAX ) tablet 0.25 mg (has no administration in time range)  pantoprazole  (PROTONIX ) EC tablet 40 mg (has no administration in time range)  ferrous sulfate  tablet 325 mg (has no administration in time range)  lactated ringers  bolus 500 mL (0 mLs Intravenous Stopped 09/03/24 1745)  potassium chloride SA (KLOR-CON M) CR tablet 40 mEq (40 mEq Oral Given 09/03/24 1900)  vancomycin (VANCOREADY) IVPB 1750 mg/350 mL (1,750 mg Intravenous New Bag/Given 09/03/24 2009)    Clinical Course as of 09/03/24 2242  Mon Sep 03, 2024  1642 DG Chest Portable 1 View Do not appreciate obvious pneumonia or pneumothorax on my independent review of images [TY]  1642 DG Knee Right Port On my independent review of images no obvious fractures.  Soft tissue edema present, but I do not appreciate gas [TY]  1831 Potassium(!): 3.1 Will replete [TY]  1914 CBC with Differential(!) No leukocytosis fever or tachycardia to suggest systemic infection [TY]  1914 Hemoglobin(!): 10.5 It is stable compared to prior [TY]  1914 Comprehensive metabolic panel(!) Appears to have mild AKI.  Baseline 0.8 a few weeks ago [TY]  1914 Troponin T High Sensitivity: 18 Weakness not secondary to cardiac etiology [TY]  1915 VAS US  LOWER EXTREMITY VENOUS (DVT) (ONLY [facility] & [facility]) Ultrasound negative for DVT [TY]  1915 DG Knee Right Port IMPRESSION: 1. 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 2. Soft tissue swelling surrounding the knee. 3. Osseous fragment anterior to the distal femur on the lateral view, indeterminate. 4. Patella appears high riding.  Please correlate clinically.   Electronically Signed   By: Greig Pique M.D.   [TY]  [phone] With on-call [facility], will  see patient in consult tomorrow. [TY]  1930 Discussed with hospitalist, will see and admit patient. [TY]    Clinical Course User Index [TY] Neysa Caron PARAS, DO                                 Medical Decision Making This is an 83 year old female complex past medical history to include GERD, IBS, venous insufficiency/lymphedema recent knee replacement on right with Dr. ALucio on November 10.  Uncomplicated per chart review.  She is afebrile nontachycardic, normotensive.  Maintaining ox saturation on room air.  Her knee does have some concerning signs for possible infection.  Will get screening labs.  X-ray.  Does have some global edema to the leg as well on the right as compared to left.  Will get ultrasound to evaluate for DVT.  Caretaker notes decreased urine output the past few days as well.  Weakness could be secondary to dehydration/acute renal failure or electrolyte abnormalities.  Will give IV fluids.  Given patient was ambulatory on Wednesday and is essentially maximal assistance currently may need admission.  See ED course for further MDM and final disposition.  Amount and/or Complexity of Data Reviewed Labs: ordered. Decision-making details documented in ED Course. Radiology: ordered and independent interpretation performed. Decision-making details documented in ED Course. ECG/medicine tests: ordered and independent interpretation performed.  Risk Prescription drug management. Decision regarding hospitalization. Diagnosis or treatment significantly limited by social determinants of health. Risk Details: Has comfort care caretaker       Final diagnoses:  Weakness  Hypokalemia  Cellulitis of right lower extremity  AKI (acute kidney injury)    ED Discharge Orders  None          Neysa Caron PARAS, [place] 09/03/24 2242

## 2024-09-03 NOTE — ED Notes (Signed)
 Pt in stretcher denies any pain.  States redness and warmth on right knee have increased especially since Friday.  Up until Friday she had been able to use a walker to mobilize .  She was due to go to her rehab appointment this afternoon but because of her concern for her knee discomfort, redness and warmth at site they did not go to rehab but came here to the ER.  She has not been able to use walker much at all since Friday.

## 2024-09-03 NOTE — ED Triage Notes (Signed)
 Pt bib EMS from home. CO weakness in both legs since Friday. Lymphedema in both legs. R Knee replacement 10th Nov. Wednesday released from rehab and has been walking fine until Friday. New onset knee pain and tremors. Knee warmth reported by caregiver. 136/74 BP 96 HR 20 RR 100% RA 98.1 T. A&Ox4

## 2024-09-03 NOTE — Plan of Care (Signed)
Discuss and review plan of care with patient/family  

## 2024-09-03 NOTE — ED Notes (Signed)
 Right knee old incision site (surgery 08/13/24) Site reddened and slightly warm to touch.  No pain unless she tries to move her knee or walk.SABRA

## 2024-09-03 NOTE — H&P (Signed)
 History and Physical  Crystal Little FMW:[id number] DOB: Mar 26, 1941 DOA: 09/03/2024  PCP: Johnny Garnette LABOR, MD   Chief Complaint: Right knee pain, warmth and redness  HPI: Crystal Little is a 83 y.o. female with medical history significant for knee osteoarthritis s/p right knee total arthroplasty on 08/13/2024, GERD, IBS, PVD, lymphedema, IDA, varicose veins and anxiety who presented to the ED for evaluation of right knee pain, redness and warmth.  Patient reports that after returning home from rehab on Wednesday, she was back to performing her ADLs on her own including ambulating with a walker, cooking and bathing. On Saturday, she had difficulty standing up to walk due to weakness in both legs.  She started noticing increased redness and warmth on her right knee.  Today, the right knee redness and warmth has progressed with increased right knee discomfort so she presents to the ED for further evaluation. According to friend, patient has had poor p.o. intake over the last few days. She endorses mild constipation but took MiraLAX  yesterday and had a large bowel movement. She denies any fevers, chills, cough, shortness of breath, chest pain, fall, abdominal pain, nausea or vomiting.  ED Course: Initial vitals show patient afebrile and normotensive. Initial labs significant for K+ 3.1, WBC 9.8, Hgb 10.5, platelet 484, normal lactic acid and troponin creatinine 1.28, alk phos 131. EKG shows sinus rhythm. CXR shows no active disease. X-ray of the right knee shows total knee arthroplasty in anatomical position with soft tissue swelling around the knee, osseous fragment anterior to the distal femur on the lateral view with slightly high riding patella.  Pt received IV LR 500 cc bolus, KCl 40 mEq x 1 and IV vancomycin.  Orthopedic surgery was consulted for evaluation. [facility] was consulted for admission.   Review of Systems: Please see HPI for pertinent positives and negatives. A complete 10 system review of  systems are otherwise negative.  Past Medical History:  Diagnosis Date   Anemia    Anxiety    Diverticulosis    Frequent UTI    sees Dr. Nicholaus   GERD (gastroesophageal reflux disease)    History of hiatal hernia    IBS (irritable bowel syndrome)    Leg edema    Osteoarthritis    Peripheral vascular disease    Status post dilation of esophageal narrowing    Varicose veins of legs    sees [facility]    Past Surgical History:  Procedure Laterality Date   BACK SURGERY  10/2021   lumbar 4-5   COLONOSCOPY  04/26/2011   per Dr. Jakie, diverticulosis only, no repeats needed   endovascular repair of bilateral leg varicosities     per Dr. Audrey    ESOPHAGOGASTRODUODENOSCOPY  01/19/2010   per Dr. Jakie, with dilatation    ESOPHAGOGASTRODUODENOSCOPY (EGD) WITH ESOPHAGEAL DILATION     PATELLAR TENDON REPAIR Left 10/19/2023   Procedure: PATELLAR TENDON REPAIR;  Surgeon: Melodi Lerner, MD;  Location: [facility];  Service: Orthopedics;  Laterality: Left;   right knee arthroscopy  12/17/05 and 01-06-12   per Dr. Beverley   TONSILLECTOMY     TOOTH EXTRACTION Right 09/20/2023   Lower right side with bone implant   TOTAL KNEE ARTHROPLASTY Left 04/18/2023   Procedure: TOTAL KNEE ARTHROPLASTY;  Surgeon: Melodi Lerner, MD;  Location: [facility];  Service: Orthopedics;  Laterality: Left;   TOTAL KNEE ARTHROPLASTY Right 08/13/2024   Procedure: ARTHROPLASTY, KNEE, TOTAL;  Surgeon: Melodi Lerner, MD;  Location: [facility];  Service: Orthopedics;  Laterality: Right;   Social History:  reports that she has never smoked. She has never used smokeless tobacco. She reports current alcohol use of about 2.0 standard drinks of alcohol per week. She reports that she does not use drugs.  Allergies  Allergen Reactions   Amoxicillin      Unknown reaction     Family History  Problem Relation Age of Onset   Irritable bowel syndrome Mother    Pancreatic cancer Paternal Grandmother    Diabetes  Maternal Aunt    Diabetes Maternal Uncle    Heart disease Paternal Grandfather    Colon cancer Neg Hx    Colon polyps Neg Hx    Kidney disease Neg Hx      Prior to Admission medications   Medication Sig Start Date End Date Taking? Authorizing Provider  ALPRAZolam  (XANAX ) 0.25 MG tablet Take 1 tablet (0.25 mg total) by mouth at bedtime. Patient taking differently: Take 0.25 mg by mouth at bedtime as needed for anxiety or sleep. 06/20/24   Johnny Garnette LABOR, MD  aspirin  81 MG chewable tablet Chew 1 tablet (81 mg total) by mouth 2 (two) times daily for 21 days. Then take one 81 mg aspirin  once a day for three weeks. Then discontinue aspirin . 08/15/24 09/05/24  Reena Roxie CROME, PA  azithromycin  (ZITHROMAX  Z-PAK) 250 MG tablet As directed 07/26/24   Johnny Garnette LABOR, MD  bisacodyl  (DULCOLAX) 10 MG suppository Place 1 suppository (10 mg total) rectally daily as needed for moderate constipation. 08/15/24   Edmisten, Kristie L, PA  Calcium-Vitamin D -Vitamin K (VIACTIV CALCIUM PLUS D PO) Take 1 tablet by mouth daily.    [provider]  Carboxymethylcellulose Sodium (THERATEARS OP) Place 1 drop into both eyes 3 (three) times daily as needed (dry/irritated eyes.).    [provider]  cholecalciferol (VITAMIN D3) 25 MCG (1000 UNIT) tablet Take 1,000 Units by mouth daily.    [provider]  cyclobenzaprine  (FLEXERIL ) 10 MG tablet Take 1 tablet (10 mg total) by mouth 3 (three) times daily as needed for muscle spasms. 08/15/24   Edmisten, Kristie L, PA  fluticasone (FLONASE) 50 MCG/ACT nasal spray Place 1 spray into both nostrils daily as needed (nasal congestion.).    [provider]  Hydrocortisone 2 % CREA Apply 1 Application topically daily as needed (leg itching).    [provider]  Iron , Ferrous Sulfate , 325 (65 Fe) MG TABS Take 325 mg by mouth daily. 06/20/24   Johnny Garnette LABOR, MD  omeprazole  (PRILOSEC) 40 MG capsule Take 1 capsule (40 mg total) by mouth  daily. 06/20/24   Johnny Garnette LABOR, MD  ondansetron  (ZOFRAN ) 4 MG tablet Take 1 tablet (4 mg total) by mouth every 6 (six) hours as needed for nausea. 08/15/24   Edmisten, Roxie CROME, PA  oxyCODONE  (OXY IR/ROXICODONE ) 5 MG immediate release tablet Take 1 tablet (5 mg total) by mouth every 4 (four) hours as needed for severe pain (pain score 7-10). 08/15/24   Edmisten, Kristie L, PA  polyethylene glycol (MIRALAX  / GLYCOLAX ) 17 g packet Take 17 g by mouth daily as needed for mild constipation. 08/15/24   Edmisten, Kristie L, PA  torsemide  (DEMADEX ) 20 MG tablet Take 2 tablets (40 mg total) by mouth daily. 06/20/24   Johnny Garnette LABOR, MD  traMADol  (ULTRAM ) 50 MG tablet Take 1-2 tablets (50-100 mg total) by mouth every 6 (six) hours as needed for moderate pain (pain score 4-6). 08/15/24   Edmisten, Kristie L,  PA    Physical Exam: BP 133/77   Pulse 88   Temp 98 F (36.7 C) (Oral)   Resp 17   Ht 5' 3 (1.6 m)   Wt 90 kg   SpO2 99%   BMI 35.15 kg/m  General: Pleasant, weak appearing elderly woman laying in bed. No acute distress. HEENT: [place]/AT. Anicteric sclera CV: RRR. No murmurs, rubs, or gallops. No LE edema Pulmonary: Lungs CTAB. Normal effort. No wheezing or rales. Abdominal: Soft, nontender, nondistended. Normal bowel sounds. Extremities: Lymphedema of the lower extremities.  Right knee with moderate edema and redness, limited ROM due to pain. Palpable pedal pulses. Skin: Warm and dry. Right knee incision site with surrounding erythema, warmth, edema and tenderness but no fluctuance or purulent drainage Neuro: A&Ox3. Moves all extremities. Normal sensation to light touch. No focal deficit. Psych: Normal mood and affect              Labs on Admission:  Basic Metabolic Panel: Recent Labs  Lab 09/03/24 1700  NA 137  K 3.1*  CL 90*  CO2 35*  GLUCOSE 94  BUN 29*  CREATININE 1.28*  CALCIUM 10.2   Liver Function Tests: Recent Labs  Lab 09/03/24 1700  AST 19  ALT 7  ALKPHOS 131*   BILITOT 0.9  PROT 8.2*  ALBUMIN 4.1   No results for input(s): LIPASE, AMYLASE in the last 168 hours. No results for input(s): AMMONIA in the last 168 hours. CBC: Recent Labs  Lab 09/03/24 1700  WBC 9.8  NEUTROABS 6.8  HGB 10.5*  HCT 33.1*  MCV 91.9  PLT 484*   Cardiac Enzymes: No results for input(s): CKTOTAL, CKMB, CKMBINDEX, TROPONINI in the last 168 hours. BNP (last 3 results) No results for input(s): BNP in the last 8760 hours.  ProBNP (last 3 results) No results for input(s): PROBNP in the last 8760 hours.  CBG: No results for input(s): GLUCAP in the last 168 hours.  Radiological Exams on Admission: VAS US  LOWER EXTREMITY VENOUS (DVT) (ONLY [facility] & [facility]) Result Date: 09/03/2024  Lower Venous DVT Study Patient Name:  Crystal Little  Date of Exam:   09/03/2024 Medical Rec #: [id number]          Accession #:    [id number] Date of Birth: 1941/01/10         Patient Gender: F Patient Age:   33 years Exam Location:  [facility] Procedure:      VAS US  LOWER EXTREMITY VENOUS (DVT) Referring Phys: CARON YOUNG --------------------------------------------------------------------------------  Indications: Swelling.  Risk Factors: Surgery. Limitations: Poor ultrasound/tissue interface. Comparison Study: No prior studies. Performing Technologist: Cordella Collet RVT  Examination Guidelines: A complete evaluation includes B-mode imaging, spectral Doppler, color Doppler, and power Doppler as needed of all accessible portions of each vessel. Bilateral testing is considered an integral part of a complete examination. Limited examinations for reoccurring indications may be performed as noted. The reflux portion of the exam is performed with the patient in reverse Trendelenburg.  +---------+---------------+---------+-----------+----------+--------------+ RIGHT    CompressibilityPhasicitySpontaneityPropertiesThrombus Aging  +---------+---------------+---------+-----------+----------+--------------+ CFV      Full           Yes      Yes                                 +---------+---------------+---------+-----------+----------+--------------+ SFJ      Full                                                        +---------+---------------+---------+-----------+----------+--------------+  FV Prox  Full                                                        +---------+---------------+---------+-----------+----------+--------------+ FV Mid   Full                                                        +---------+---------------+---------+-----------+----------+--------------+ FV Distal               Yes      Yes                                 +---------+---------------+---------+-----------+----------+--------------+ PFV      Full                                                        +---------+---------------+---------+-----------+----------+--------------+ POP      Full           Yes      Yes                                 +---------+---------------+---------+-----------+----------+--------------+ PTV      Full                                                        +---------+---------------+---------+-----------+----------+--------------+ PERO     Full                                                        +---------+---------------+---------+-----------+----------+--------------+   +----+---------------+---------+-----------+----------+--------------+ LEFTCompressibilityPhasicitySpontaneityPropertiesThrombus Aging +----+---------------+---------+-----------+----------+--------------+ CFV Full           Yes      Yes                                 +----+---------------+---------+-----------+----------+--------------+    Summary: RIGHT: - There is no evidence of deep vein thrombosis in the lower extremity. However, portions of this examination were limited- see  technologist comments above.  - No cystic structure found in the popliteal fossa.  LEFT: - No evidence of common femoral vein obstruction.   *See table(s) above for measurements and observations. Electronically signed by Fonda Rim on 09/03/2024 at 7:02:53 PM.    Final    DG Knee Right Port Result Date: 09/03/2024 CLINICAL DATA:  Knee pain EXAM: PORTABLE RIGHT KNEE - 1-2 VIEW COMPARISON:  None Available. 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 There is soft tissue swelling surrounding the knee. There is no evidence  for hardware loosening or acute fracture. Patella appears high riding. There is an osseous fragment on the lateral view seen anterior to the distal femur measuring 2.3 x 0.6 cm, indeterminate. IMPRESSION: 1. 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 2. Soft tissue swelling surrounding the knee. 3. Osseous fragment anterior to the distal femur on the lateral view, indeterminate. 4. Patella appears high riding.  Please correlate clinically. Electronically Signed   By: Greig Pique M.D.   On: 09/03/2024 17:29   DG Chest Portable 1 View Result Date: 09/03/2024 CLINICAL DATA:  Postoperative infection EXAM: PORTABLE CHEST 1 VIEW COMPARISON:  None Available. FINDINGS: The heart size and mediastinal contours are within normal limits. Both lungs are clear. The visualized skeletal structures are unremarkable. IMPRESSION: No active disease. Electronically Signed   By: Greig Pique M.D.   On: 09/03/2024 17:19   Assessment/Plan Crystal Little is a 83 y.o. female with medical history significant for  knee osteoarthritis s/p right knee total arthroplasty on 08/13/2024, GERD, IBS, PVD, lymphedema, IDA, varicose veins and anxiety who presented to the ED for evaluation of right knee pain, redness and swelling and admitted for right knee cellulitis  # Cellulitis of the right knee # S/p R knee total arthroplasty - Patient with a history of severe knee osteoarthritis status  post recent knee surgery 3 weeks ago now presented with worsening right knee pain, redness, warmth and edema limiting her mobility. - Xray of the right knee shows stable total knee arthroplasty with soft tissue swelling surrounding the knee but no obvious abscess/fluid collection - Patient hemodynamically stable, afebrile without leukocytosis - Patient with nonpurulent cellulitis, start IV Zyvox - Pain control with scheduled Tylenol  and as needed oxycodone  - Follow-up blood cultures, trend CBC and fever curve  # AKI - Creatinine minimally elevated to 1.28, from normal baseline - Prerenal in the setting of dehydration from recent poor p.o. intake - Start IV LR at 100 cc/hr - Trend renal function and avoid nephrotoxic meds  # Hypokalemia - K+ low at 3.1 on admission likely due to home diuretic - S/p total of 40 mEq KCl given in the ED - Give additional 40 mEq x 1 - F/u morning potassium and mag  # Iron  deficiency anemia - Hgb stable at 10.5 - Continue iron  supplementation  # Lymphedema - Chronic and stable. Reports she uses compression socks and lymphedema pump at home - Continue home torsemide  - Apply TED hose  # GERD - Continue PPI  # Anxiety - Continue as needed Xanax   # Generalized weakness - In the setting of acute illness - Start protein supplementation - PT/OT eval and treat - Fall precautions  # Class II obesity Body mass index is 35.15 kg/m. Filed Weights   09/03/24 1546  Weight: 90 kg  - Complicates current problems - F/u with PCP for weight lost and nutrition counseling   DVT prophylaxis: Lovenox     Code Status: Limited: Do not attempt resuscitation (DNR) -DNR-LIMITED -Do Not Intubate/DNI   Consults called: Orthopedic surgery  Family Communication: Discussed plan for admission with friend at bedside.  Severity of Illness: The appropriate patient status for this patient is INPATIENT. Inpatient status is judged to be reasonable and necessary in order  to provide the required intensity of service to ensure the patient's safety. The patient's presenting symptoms, physical exam findings, and initial radiographic and laboratory data in the context of their chronic comorbidities is felt to place them at high risk for further clinical deterioration. Furthermore,  it is not anticipated that the patient will be medically stable for discharge from the hospital within 2 midnights of admission.   * I certify that at the point of admission it is my clinical judgment that the patient will require inpatient hospital care spanning beyond 2 midnights from the point of admission due to high intensity of service, high risk for further deterioration and high frequency of surveillance required.*  Level of care: Med-Surg    Lou Claretta HERO, MD 09/03/2024, 8:17 PM [facility] Pager: [phone] Isaiah 41:10   If 7PM-7AM, please contact night-coverage [email or web address] Password [email or web address]

## 2024-09-03 NOTE — Progress Notes (Signed)
 ED Pharmacy Antibiotic Sign Off An antibiotic consult was received from an ED provider for vancomycin per pharmacy dosing for cellulitis. A chart review was completed to assess appropriateness.   The following one time order(s) were placed:  Vancomycin 1750 mg   Further antibiotic and/or antibiotic pharmacy consults should be ordered by the admitting provider if indicated.   Thank you for allowing pharmacy to be a part of this patient's care.   Rosaline IVAR Edison, Pharm.D Use secure chat for questions 09/03/2024 7:19 PM Clinical Pharmacist 09/03/24 7:18 PM

## 2024-09-03 NOTE — ED Notes (Signed)
 Vancomycin started.  Pt eating sandwich, soda.  Pt transferred upstairs with caregiver at bedside

## 2024-09-03 NOTE — Progress Notes (Signed)
 Right lower extremity venous duplex has been completed. Preliminary results can be found in CV Proc through chart review.  Results were given to Dr. Neysa.  09/03/24 5:20 PM Cathlyn Collet RVT

## 2024-09-03 NOTE — Transitions of Care (Post Inpatient/ED Visit) (Unsigned)
   09/03/2024  Name: Crystal Little MRN: [id number] DOB: 1941/03/26  Today's TOC FU Call Status: Today's TOC FU Call Status:: Unsuccessful Call (1st Attempt) Unsuccessful Call (1st Attempt) Date: 09/03/24  Attempted to reach the patient regarding the most recent Inpatient/ED visit.  Follow Up Plan: Additional outreach attempts will be made to reach the patient to complete the Transitions of Care (Post Inpatient/ED visit) call.   Signature Avelina Essex, CMA (AAMA)  [facility] [phone]

## 2024-09-03 NOTE — ED Notes (Signed)
 Difficult IV start, attempting Ultrasound guided.

## 2024-09-04 ENCOUNTER — Inpatient Hospital Stay (HOSPITAL_COMMUNITY)

## 2024-09-04 DIAGNOSIS — E66812 Obesity, class 2: Secondary | ICD-10-CM | POA: Diagnosis not present

## 2024-09-04 DIAGNOSIS — L03115 Cellulitis of right lower limb: Secondary | ICD-10-CM | POA: Diagnosis not present

## 2024-09-04 DIAGNOSIS — E876 Hypokalemia: Secondary | ICD-10-CM | POA: Diagnosis not present

## 2024-09-04 DIAGNOSIS — R531 Weakness: Secondary | ICD-10-CM | POA: Diagnosis not present

## 2024-09-04 LAB — URINALYSIS, ROUTINE W REFLEX MICROSCOPIC
Bilirubin Urine: NEGATIVE
Glucose, UA: NEGATIVE mg/dL
Hgb urine dipstick: NEGATIVE
Ketones, ur: NEGATIVE mg/dL
Nitrite: NEGATIVE
Protein, ur: NEGATIVE mg/dL
Specific Gravity, Urine: 1.011 (ref 1.005–1.030)
pH: 5 (ref 5.0–8.0)

## 2024-09-04 LAB — CBC
HCT: 30.5 % — ABNORMAL LOW (ref 36.0–46.0)
Hemoglobin: 9.6 g/dL — ABNORMAL LOW (ref 12.0–15.0)
MCH: 29.1 pg (ref 26.0–34.0)
MCHC: 31.5 g/dL (ref 30.0–36.0)
MCV: 92.4 fL (ref 80.0–100.0)
Platelets: 394 K/uL (ref 150–400)
RBC: 3.3 MIL/uL — ABNORMAL LOW (ref 3.87–5.11)
RDW: 14.6 % (ref 11.5–15.5)
WBC: 9.3 K/uL (ref 4.0–10.5)
nRBC: 0 % (ref 0.0–0.2)

## 2024-09-04 LAB — BASIC METABOLIC PANEL WITH GFR
Anion gap: 10 (ref 5–15)
BUN: 28 mg/dL — ABNORMAL HIGH (ref 8–23)
CO2: 33 mmol/L — ABNORMAL HIGH (ref 22–32)
Calcium: 9.2 mg/dL (ref 8.9–10.3)
Chloride: 96 mmol/L — ABNORMAL LOW (ref 98–111)
Creatinine, Ser: 1.34 mg/dL — ABNORMAL HIGH (ref 0.44–1.00)
GFR, Estimated: 39 mL/min — ABNORMAL LOW (ref >60)
Glucose, Bld: 106 mg/dL — ABNORMAL HIGH (ref 70–99)
Potassium: 3.6 mmol/L (ref 3.5–5.1)
Sodium: 139 mmol/L (ref 135–145)

## 2024-09-04 LAB — MAGNESIUM: Magnesium: 2.5 mg/dL — ABNORMAL HIGH (ref 1.7–2.4)

## 2024-09-04 LAB — PHOSPHORUS: Phosphorus: 2.7 mg/dL (ref 2.5–4.6)

## 2024-09-04 MED ORDER — BETHANECHOL CHLORIDE 25 MG PO TABS
25.0000 mg | ORAL_TABLET | Freq: Three times a day (TID) | ORAL | Status: DC
  Administered 2024-09-04 – 2024-09-08 (×10): 25 mg via ORAL
  Filled 2024-09-04 (×14): qty 1

## 2024-09-04 MED ORDER — CHLORHEXIDINE GLUCONATE CLOTH 2 % EX PADS
6.0000 | MEDICATED_PAD | Freq: Every day | CUTANEOUS | Status: DC
  Administered 2024-09-04 – 2024-09-05 (×2): 6 via TOPICAL

## 2024-09-04 NOTE — Progress Notes (Signed)
 Crystal Little was notified that patient was hasn't voided all night since admission. Bladder scanners showed 909 mls. She states she feels like she needs to void but is unable.

## 2024-09-04 NOTE — Progress Notes (Addendum)
 Notified by PT today that patient had difficulty with therapy. Extension lag noted with inability to perform SLR. Will order ultrasound to rule out a patellar tendon tear given patella alta found on lateral XR.  ADDENDUM: Ultrasound unavailable. Order changed to [facility] MRI.  Roxie Mess, PA-C Orthopedic Surgery [facility]

## 2024-09-04 NOTE — Progress Notes (Signed)
 Subjective: [name] is an 83 yo female well known to me from knee surgeries, who had a right TKA approximately 3 weeks ago and was doing very well at her 2 week post-op visit on 11/26. She states that over the weekend she began feeling week and had a hard time getting up and attempting to ambulate. She was seen by home health yesterday who was concerned about redness in her right knee and the possibility of infection. She has not had any fever, chills or systemic symptoms. She had an appointment to be seen in our office today at 10 AM but was told to go to the ED by the home health person. She has not had any excessive pain in the right knee and has not had any wound issues or drainage from the knee  Objective: Vital signs in last 24 hours: Temp:  [97.9 F (36.6 C)-99 F (37.2 C)] 98 F (36.7 C) (12/02 0511) Pulse Rate:  [74-94] 74 (12/02 0511) Resp:  [14-20] 14 (12/02 0511) BP: (120-139)/(55-77) 123/58 (12/02 0511) SpO2:  [96 %-100 %] 100 % (12/02 0511) Weight:  [90 kg] 90 kg (12/01 1546)  Intake/Output from previous day: 12/01 0701 - 12/02 0700 In: 1022.7 [P.O.:300; I.V.:457.4; IV Piggyback:265.3] Out: 800 [Urine:800] Intake/Output this shift: No intake/output data recorded.  Recent Labs    09/03/24 1700 09/04/24 0450  HGB 10.5* 9.6*   Recent Labs    09/03/24 1700 09/04/24 0450  WBC 9.8 9.3  RBC 3.60* 3.30*  HCT 33.1* 30.5*  PLT 484* 394   Recent Labs    09/03/24 1700 09/04/24 0450  NA 137 139  K 3.1* 3.6  CL 90* 96*  CO2 35* 33*  BUN 29* 28*  CREATININE 1.28* 1.34*  GLUCOSE 94 106*  CALCIUM 10.2 9.2   No results for input(s): LABPT, INR in the last 72 hours.  Neurologically intact Neurovascular intact Incision: no drainage and minimal erythema lateral to the knee with bruising but no signs of cellulitis or infection No cellulitis present Compartment soft There is no defect palpable in the extensor mechanism and she can do a straight leg raise with  a 10 degree extensor lag  X-ray - prosthesis in good position; patella alta  Assessment/Plan: S/P right TKA- She has no signs of knee infection but is not thriving at home. Was doing a lot better last week.Recommend PT to hopefully get her moving well again. Patella alta is concerning given extensor mechanism rupture post-op the left knee but she has not had increased pain or an episode on the right knee which would indicate a potential problem   Crystal Little 09/04/2024, 7:29 AM

## 2024-09-04 NOTE — Progress Notes (Signed)
 PROGRESS NOTE  Crystal Little FMW:[id number] DOB: 04-07-1941   PCP: Johnny Garnette LABOR, MD  Patient is from: Home.  Lives alone.  Has rolling walker  DOA: 09/03/2024 LOS: 1  Chief complaints Chief Complaint  Patient presents with   Weakness     Brief Narrative / Interim history: 83 year old F with PMH of recent right TKA on 08/13/2024, PVD, lymphedema, IBS, anxiety, IDA and class II obesity presenting with increased right knee pain, redness and increased warmth to touch, and admitted with right knee cellulitis.   In ED, stable vitals.  BBC 9.8.  X-ray of right knee showed TKA in anatomical position with soft tissue swelling around the knee and osseous fragments anterior to the distal femur on the lateral view with a slightly high riding patella.  Patient was started on IV fluid and IV vancomycin.  Orthopedic surgery consulted and she was admitted for further care.  She was started on Zyvox.  The next day, evaluated by orthopedic surgery.  Subjective: Seen and examined earlier this morning.  No major events overnight or this morning.  No complaints.  Denies pain.  Still with some redness and swelling.  Denies chest pain, shortness of breath, GI or UTI symptoms.  Seems like she had urinary tension overnight with bladder scan showing 900 cc but UOP of 800 cc on I&O.  Patient's son at bedside.   Assessment and plan: Cellulitis of the right knee: Presents with redness, swelling and pain.  Cultures NGTD.  Improving. 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  Right knee x-ray as above. -Continue Zyvox -Tylenol  and oxycodone  for pain control -On Lovenox for VTE prophylaxis. -PT/OT   AKI: Baseline Cr ranges from 0.7-0.8.  Had urinary retention overnight.  She is also on torsemide  at home. Recent Labs    10/20/23 0345 06/20/24 1406 07/31/24 1445 08/14/24 1051 09/03/24 1700 09/04/24 0450  BUN 17 15 17 16  29* 28*  CREATININE 0.80 0.69 0.76 0.84 1.28* 1.34*  - Intermittent bladder  scan - Strict intake and output - Check renal US . - Hold torsemide .  Already received morning dose  Anemia of renal disease: Stable. Recent Labs    10/13/23 0858 10/20/23 0345 10/21/23 0356 06/20/24 1406 07/31/24 1445 08/14/24 1051 08/15/24 0338 09/03/24 1700 09/04/24 0450  HGB 11.8* 10.4* 9.6* 12.2 12.1 11.1* 9.9* 10.5* 9.6*  - Continue monitoring  Lymphedema?  Stable.  Reports she uses compression socks and lymphedema pump at home -Discontinue IV fluid. - Apply TED hose   Hypokalemia -Monitor replenish K and Mg as appropriate  GERD - Continue PPI   Anxiety: Stable - Continue as needed Xanax    Generalized weakness - PT/OT eval  Pyuria/bacteriuria: Patient denies dysuria, frequency or urgency.   Class II obesity Body mass index is 35.15 kg/m.           DVT prophylaxis:  enoxaparin (LOVENOX) injection 40 mg Start: 09/03/24 2200 Place TED hose Start: 09/03/24 2019  Code Status: DNR Family Communication: Updated patient's son at bedside Level of care: Med-Surg Status is: Inpatient Remains inpatient appropriate because: Right knee cellulitis and AKI   Final disposition: Home   55 minutes with more than 50% spent in reviewing records, counseling patient/family and coordinating care.  Consultants:  Orthopedic surgery  Procedures: None  Microbiology summarized: Blood cultures NGTD  Objective: Vitals:   09/03/24 1939 09/03/24 2033 09/04/24 0129 09/04/24 0511  BP:  (!) 139/55 (!) 120/55 (!) 123/58  Pulse: 88 85 82 74  Resp: 17 15 15  14  Temp: 98 F (36.7 C) 99 F (37.2 C) 97.9 F (36.6 C) 98 F (36.7 C)  TempSrc: Oral Oral Oral   SpO2: 99% 98% 96% 100%  Weight:      Height:        Examination:  GENERAL: No apparent distress.  Nontoxic. HEENT: MMM.  Vision and hearing grossly intact.  NECK: Supple.  No apparent JVD.  RESP:  No IWOB.  Fair aeration bilaterally. CVS:  RRR. Heart sounds normal.  ABD/GI/GU: BS+. Abd soft, NTND.   MSK/EXT:  Moves extremities.  Notable swelling and erythema around right knee.  No increased warmth to touch.  Fair range of motion in right knee. SKIN: As above. NEURO: AA.  Oriented appropriately.  No apparent focal neuro deficit. PSYCH: Calm. Normal affect.   Sch Meds:  Scheduled Meds:  acetaminophen   1,000 mg Oral TID   enoxaparin (LOVENOX) injection  40 mg Subcutaneous Q24H   feeding supplement  237 mL Oral TID BM   ferrous sulfate   325 mg Oral Q breakfast   pantoprazole   40 mg Oral Daily   senna-docusate  1 tablet Oral QHS   Continuous Infusions:  linezolid (ZYVOX) IV     PRN Meds:.ALPRAZolam , bisacodyl , ondansetron  **OR** ondansetron  (ZOFRAN ) IV, oxyCODONE   Antimicrobials: Anti-infectives (From admission, onward)    Start     Dose/Rate Route Frequency Ordered Stop   09/04/24 2200  linezolid (ZYVOX) IVPB 600 mg        600 mg 300 mL/hr over 60 Minutes Intravenous Every 12 hours 09/03/24 2023 09/11/24 2159   09/03/24 1945  vancomycin (VANCOREADY) IVPB 1750 mg/350 mL        1,750 mg 175 mL/hr over 120 Minutes Intravenous  Once 09/03/24 1914 09/04/24 0803        I have personally reviewed the following labs and images: CBC: Recent Labs  Lab 09/03/24 1700 09/04/24 0450  WBC 9.8 9.3  NEUTROABS 6.8  --   HGB 10.5* 9.6*  HCT 33.1* 30.5*  MCV 91.9 92.4  PLT 484* 394   BMP &GFR Recent Labs  Lab 09/03/24 1700 09/04/24 0450  NA 137 139  K 3.1* 3.6  CL 90* 96*  CO2 35* 33*  GLUCOSE 94 106*  BUN 29* 28*  CREATININE 1.28* 1.34*  CALCIUM 10.2 9.2  MG  --  2.5*  PHOS  --  2.7   Estimated Creatinine Clearance: 33.3 mL/min (A) (by C-G formula based on SCr of 1.34 mg/dL (H)). Liver & Pancreas: Recent Labs  Lab 09/03/24 1700  AST 19  ALT 7  ALKPHOS 131*  BILITOT 0.9  PROT 8.2*  ALBUMIN 4.1   No results for input(s): LIPASE, AMYLASE in the last 168 hours. No results for input(s): AMMONIA in the last 168 hours. Diabetic: No results for input(s):  HGBA1C in the last 72 hours. No results for input(s): GLUCAP in the last 168 hours. Cardiac Enzymes: No results for input(s): CKTOTAL, CKMB, CKMBINDEX, TROPONINI in the last 168 hours. No results for input(s): PROBNP in the last 8760 hours. Coagulation Profile: No results for input(s): INR, PROTIME in the last 168 hours. Thyroid  Function Tests: No results for input(s): TSH, T4TOTAL, FREET4, T3FREE, THYROIDAB in the last 72 hours. Lipid Profile: No results for input(s): CHOL, HDL, LDLCALC, TRIG, CHOLHDL, LDLDIRECT in the last 72 hours. Anemia Panel: No results for input(s): VITAMINB12, FOLATE, FERRITIN, TIBC, IRON , RETICCTPCT in the last 72 hours. Urine analysis:    Component Value Date/Time   COLORURINE YELLOW 09/04/2024 0530  APPEARANCEUR HAZY (A) 09/04/2024 0530   LABSPEC 1.011 09/04/2024 0530   PHURINE 5.0 09/04/2024 0530   GLUCOSEU NEGATIVE 09/04/2024 0530   HGBUR NEGATIVE 09/04/2024 0530   HGBUR negative 02/06/2008 0947   BILIRUBINUR NEGATIVE 09/04/2024 0530   BILIRUBINUR n 10/25/2018 1453   KETONESUR NEGATIVE 09/04/2024 0530   PROTEINUR NEGATIVE 09/04/2024 0530   UROBILINOGEN 0.2 10/25/2018 1453   UROBILINOGEN 0.2 02/06/2008 0947   NITRITE NEGATIVE 09/04/2024 0530   LEUKOCYTESUR LARGE (A) 09/04/2024 0530   Sepsis Labs: Invalid input(s): PROCALCITONIN, LACTICIDVEN  Microbiology: Recent Results (from the past 240 hours)  Culture, blood (routine x 2)     Status: None (Preliminary result)   Collection Time: 09/03/24  5:00 PM   Specimen: BLOOD  Result Value Ref Range Status   Specimen Description   Final    BLOOD SITE NOT SPECIFIED Performed at [facility], [address], [place], [place] [address]    Special Requests   Final    BOTTLES DRAWN AEROBIC AND ANAEROBIC Blood Culture results may not be optimal due to an inadequate volume of blood received in culture bottles Performed at [facility], [address] [place], [place] [address]    Culture   Final    NO GROWTH < 12 HOURS Performed at [facility], [address], [place], [place] [address]    Report Status PENDING  Incomplete  Culture, blood (routine x 2)     Status: None (Preliminary result)   Collection Time: 09/03/24  9:15 PM   Specimen: BLOOD LEFT ARM  Result Value Ref Range Status   Specimen Description   Final    BLOOD LEFT ARM Performed at [facility], [address], [place], [place] [address]    Special Requests   Final    BOTTLES DRAWN AEROBIC ONLY Blood Culture results may not be optimal due to an inadequate volume of blood received in culture bottles Performed at [facility], [address], [place], [place] [address]    Culture   Final    NO GROWTH < 12 HOURS Performed at [facility], [address] [place], [place] [address]    Report Status PENDING  Incomplete    Radiology Studies: VAS US  LOWER EXTREMITY VENOUS (DVT) (ONLY [facility] & [facility]) Result Date: 09/03/2024  Lower Venous DVT Study Patient Name:  MESA JANUS  Date of Exam:   09/03/2024 Medical Rec #: [id number]          Accession #:    [id number] Date of Birth: 26-Oct-1940         Patient Gender: F Patient Age:   30 years Exam Location:  [facility] Procedure:      VAS US  LOWER EXTREMITY VENOUS (DVT) Referring Phys: CARON YOUNG --------------------------------------------------------------------------------  Indications: Swelling.  Risk Factors: Surgery. Limitations: Poor ultrasound/tissue interface. Comparison Study: No prior studies. Performing Technologist: Cordella Collet RVT  Examination Guidelines: A complete evaluation includes B-mode imaging, spectral Doppler, color Doppler, and power Doppler as needed of all accessible portions of each vessel. Bilateral testing is considered an integral part of a complete examination. Limited examinations for reoccurring  indications may be performed as noted. The reflux portion of the exam is performed with the patient in reverse Trendelenburg.  +---------+---------------+---------+-----------+----------+--------------+ RIGHT    CompressibilityPhasicitySpontaneityPropertiesThrombus Aging +---------+---------------+---------+-----------+----------+--------------+ CFV      Full           Yes      Yes                                 +---------+---------------+---------+-----------+----------+--------------+  SFJ      Full                                                        +---------+---------------+---------+-----------+----------+--------------+ FV Prox  Full                                                        +---------+---------------+---------+-----------+----------+--------------+ FV Mid   Full                                                        +---------+---------------+---------+-----------+----------+--------------+ FV Distal               Yes      Yes                                 +---------+---------------+---------+-----------+----------+--------------+ PFV      Full                                                        +---------+---------------+---------+-----------+----------+--------------+ POP      Full           Yes      Yes                                 +---------+---------------+---------+-----------+----------+--------------+ PTV      Full                                                        +---------+---------------+---------+-----------+----------+--------------+ PERO     Full                                                        +---------+---------------+---------+-----------+----------+--------------+   +----+---------------+---------+-----------+----------+--------------+ LEFTCompressibilityPhasicitySpontaneityPropertiesThrombus Aging +----+---------------+---------+-----------+----------+--------------+ CFV Full            Yes      Yes                                 +----+---------------+---------+-----------+----------+--------------+    Summary: RIGHT: - There is no evidence of deep vein thrombosis in the lower extremity. However, portions of this examination were limited- see technologist comments above.  - No cystic structure found in the popliteal fossa.  LEFT: - No evidence of common femoral vein obstruction.   *See table(s) above for measurements  and observations. Electronically signed by Fonda Rim on 09/03/2024 at 7:02:53 PM.    Final    DG Knee Right Port Result Date: 09/03/2024 CLINICAL DATA:  Knee pain EXAM: PORTABLE RIGHT KNEE - 1-2 VIEW COMPARISON:  None Available. 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 There is soft tissue swelling surrounding the knee. There is no evidence for hardware loosening or acute fracture. Patella appears high riding. There is an osseous fragment on the lateral view seen anterior to the distal femur measuring 2.3 x 0.6 cm, indeterminate. IMPRESSION: 1. 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 2. Soft tissue swelling surrounding the knee. 3. Osseous fragment anterior to the distal femur on the lateral view, indeterminate. 4. Patella appears high riding.  Please correlate clinically. Electronically Signed   By: Greig Pique M.D.   On: 09/03/2024 17:29   DG Chest Portable 1 View Result Date: 09/03/2024 CLINICAL DATA:  Postoperative infection EXAM: PORTABLE CHEST 1 VIEW COMPARISON:  None Available. FINDINGS: The heart size and mediastinal contours are within normal limits. Both lungs are clear. The visualized skeletal structures are unremarkable. IMPRESSION: No active disease. Electronically Signed   By: Greig Pique M.D.   On: 09/03/2024 17:19      Pattrick Bady T. Shmuel Girgis [facility]  If 7PM-7AM, please contact night-coverage [email or web address] 09/04/2024, 10:59 AM

## 2024-09-04 NOTE — H&P (View-Only) (Signed)
 Subjective: Ms Cordy is an 83 yo female well known to me from knee surgeries, who had a right TKA approximately 3 weeks ago and was doing very well at her 2 week post-op visit on 11/26. She states that over the weekend she began feeling week and had a hard time getting up and attempting to ambulate. She was seen by home health yesterday who was concerned about redness in her right knee and the possibility of infection. She has not had any fever, chills or systemic symptoms. She had an appointment to be seen in our office today at 10 AM but was told to go to the ED by the home health person. She has not had any excessive pain in the right knee and has not had any wound issues or drainage from the knee  Objective: Vital signs in last 24 hours: Temp:  [97.9 F (36.6 C)-99 F (37.2 C)] 98 F (36.7 C) (12/02 0511) Pulse Rate:  [74-94] 74 (12/02 0511) Resp:  [14-20] 14 (12/02 0511) BP: (120-139)/(55-77) 123/58 (12/02 0511) SpO2:  [96 %-100 %] 100 % (12/02 0511) Weight:  [90 kg] 90 kg (12/01 1546)  Intake/Output from previous day: 12/01 0701 - 12/02 0700 In: 1022.7 [P.O.:300; I.V.:457.4; IV Piggyback:265.3] Out: 800 [Urine:800] Intake/Output this shift: No intake/output data recorded.  Recent Labs    09/03/24 1700 09/04/24 0450  HGB 10.5* 9.6*   Recent Labs    09/03/24 1700 09/04/24 0450  WBC 9.8 9.3  RBC 3.60* 3.30*  HCT 33.1* 30.5*  PLT 484* 394   Recent Labs    09/03/24 1700 09/04/24 0450  NA 137 139  K 3.1* 3.6  CL 90* 96*  CO2 35* 33*  BUN 29* 28*  CREATININE 1.28* 1.34*  GLUCOSE 94 106*  CALCIUM 10.2 9.2   No results for input(s): LABPT, INR in the last 72 hours.  Neurologically intact Neurovascular intact Incision: no drainage and minimal erythema lateral to the knee with bruising but no signs of cellulitis or infection No cellulitis present Compartment soft There is no defect palpable in the extensor mechanism and she can do a straight leg raise with  a 10 degree extensor lag  X-ray - prosthesis in good position; patella alta  Assessment/Plan: S/P right TKA- She has no signs of knee infection but is not thriving at home. Was doing a lot better last week.Recommend PT to hopefully get her moving well again. Patella alta is concerning given extensor mechanism rupture post-op the left knee but she has not had increased pain or an episode on the right knee which would indicate a potential problem   Dempsey Moan 09/04/2024, 7:29 AM

## 2024-09-04 NOTE — Evaluation (Signed)
 Physical Therapy Evaluation Patient Details Name: Crystal Little MRN: [id number] DOB: [birth year] Today's Date: 09/04/2024  History of Present Illness  83 year old F presenting with increased right knee pain, redness and increased warmth to touch, and admitted with right knee cellulitis.ortho consulted- per Dr. 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 contacted PA/MD at time of PT Eval d/t concern for possible patella tendon rupture, MRI R knee ordered and  pending after PT eval.  PMH: R TKA on 08/13/2024, PVD, lymphedema, IBS, anxiety, IDA and class II obesity, patella tendon repair L knee 10/2023, back surgery  Clinical Impression  Pt admitted with above diagnosis.  Pt well known to me from prior admissions. Pt with significant quad lag, inability to perform active quad extension at time of PT eval. Xray findings  noted above,indeterminate other than patella alta.  Pt was up to [facility] with nursing staff/+2 assist prior to PT arrival.  Ortho MD/PA made aware and RN updated. Will continue to follow in acute setting.  Patient will benefit from continued inpatient follow up therapy, <3 hours/day   Pt currently with functional limitations due to the deficits listed below (see PT Problem List). Pt will benefit from acute skilled PT to increase their independence and safety with mobility to allow discharge.           If plan is discharge home, recommend the following: Two people to help with walking and/or transfers;Two people to help with bathing/dressing/bathroom;Help with stairs or ramp for entrance;Assist for transportation   Can  travel by private vehicle   No    Equipment Recommendations None recommended by PT  Recommendations for Other Services       Functional Status Assessment Patient has had a recent decline in their functional status and demonstrates the ability to make significant improvements in function in a reasonable and predictable amount of time.     Precautions / Restrictions Precautions Precautions: Knee;Fall Recall of Precautions/Restrictions: Intact Precaution/Restrictions Comments: placed pillow under distal LE to maitain knee extension Restrictions Weight Bearing Restrictions Per Provider Order: No Other Position/Activity Restrictions: no restrictions per  current orders      Mobility  Bed Mobility Overal bed mobility: Needs Assistance Bed Mobility: Supine to Sit, Sit to Supine     Supine to sit: Min assist Sit to supine: Min assist, Mod assist   General bed mobility comments: assist with RLE, incr time to come to sit; bil LEs assisted on to bed for return to supine    Transfers Overall transfer level: Needs assistance                 General transfer comment: attempted STS from elevated bed with incr wt shift to LLE, unable to safely stand with +2 assist.    Ambulation/Gait                  Stairs            Wheelchair Mobility     Tilt Bed    Modified Rankin (Stroke Patients Only)       Balance Overall balance assessment: Needs assistance Sitting-balance support: Feet supported, Single extremity supported, No upper  extremity supported Sitting balance-Leahy Scale: Good       Standing balance-Leahy Scale: Zero Standing balance comment: unable                             Pertinent Vitals/Pain Pain Assessment Pain Assessment: Faces Faces Pain Scale: Hurts little more Pain Location: right knee Pain Descriptors / Indicators: Sore Pain Intervention(s): Limited activity within patient's tolerance, Monitored during session,  Repositioned    Home Living Family/patient expects to be discharged to:: Private residence Living Arrangements: Alone Available Help at Discharge: Family;Friend(s);Available 24 hours/day Type of Home: Other(Comment) Home Access: Stairs to enter   Entrance Stairs-Number of Steps: 2   Home Layout: One level Home Equipment: Agricultural Consultant (2 wheels);Cane - single point;Other (comment) Additional Comments: wears brace on L knee    Prior Function Prior Level of Function : Independent/Modified Independent             Mobility Comments: using RW since d/c from rehab; has not ambulated since 08/31/24; prior to that was ambulating well with RW per pt. ADLs Comments: caregiver assisting since d/c from rehab     Extremity/Trunk Assessment        Lower Extremity Assessment RLE Deficits / Details: pt with significant quad lag with attempted SLR. knee extension 0/5. hamstrings 4/5. 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 hip flexors 3+/5       Communication   Communication Communication: No apparent difficulties Factors Affecting Communication: Hearing impaired    Cognition Arousal: Alert Behavior During Therapy: WFL for tasks assessed/performed   PT - Cognitive impairments: No apparent impairments                                 Cueing Cueing Techniques: Verbal cues, Tactile cues     General Comments      Exercises     Assessment/Plan    PT Assessment Patient needs continued PT services  PT Problem List Decreased strength;Decreased mobility;Decreased range of motion;Decreased activity tolerance;Decreased balance;Decreased knowledge of use of DME;Pain       PT Treatment Interventions DME instruction;Gait training;Functional mobility training;Therapeutic activities;Patient/family education;Therapeutic exercise;Balance training    PT Goals  (Current goals can be found in the Care Plan section)  Acute Rehab PT Goals PT Goal Formulation: With patient Time For Goal Achievement: 09/18/24 Potential to Achieve Goals: Good    Frequency Min 3X/week     Co-evaluation               AM-PAC PT 6 Clicks Mobility  Outcome Measure Help needed turning from your back to your side while in a flat bed without using bedrails?: A Little Help needed moving from lying on your back to sitting on the side of a flat bed without using bedrails?: A Little Help needed moving to and from a bed to a chair (including a wheelchair)?: Total Help needed standing up from a chair using your arms (e.g., wheelchair or bedside chair)?: Total Help needed to walk in hospital room?: Total Help needed climbing 3-5 steps with a railing? : Total 6 Click Score: 10    End of Session Equipment Utilized During Treatment: Gait belt Activity Tolerance: Other (comment) (activity limited by PT d/t R knee instability) Patient left: with call bell/phone within reach;in bed;with bed alarm set Nurse  Communication: Mobility status (bedrest would be best for now/bed pan if toileting needs) PT Visit Diagnosis: Other abnormalities of gait and mobility (R26.89);Pain Pain - Right/Left: Right Pain - part of body: Knee    Time: 8643-8575 PT Time Calculation (min) (ACUTE ONLY): 28 min   Charges:   PT Evaluation $PT Eval Low Complexity: 1 Low PT Treatments $Therapeutic Activity: 8-22 mins PT General Charges $$ ACUTE PT VISIT: 1 Visit         Malic Rosten, PT  Acute Rehab Dept [facility]) [phone]  09/04/2024   [facility] 09/04/2024, 4:49 PM

## 2024-09-04 NOTE — Progress Notes (Signed)
 Primary RN made MD aware of bladder scan results, MD ordered in and out catheter, primary RN and charge RN completed in and out catheter, in and out catheter put out , MD made aware of output.

## 2024-09-05 ENCOUNTER — Encounter (HOSPITAL_COMMUNITY): Payer: Self-pay | Admitting: Student

## 2024-09-05 ENCOUNTER — Inpatient Hospital Stay (HOSPITAL_COMMUNITY): Admitting: Anesthesiology

## 2024-09-05 ENCOUNTER — Encounter (HOSPITAL_COMMUNITY)
Admission: EM | Disposition: A | Discharge status: Skilled Nursing Facility | Payer: Self-pay | Source: Ambulatory Visit | Attending: Student

## 2024-09-05 DIAGNOSIS — L03115 Cellulitis of right lower limb: Secondary | ICD-10-CM | POA: Diagnosis not present

## 2024-09-05 DIAGNOSIS — R531 Weakness: Secondary | ICD-10-CM | POA: Diagnosis not present

## 2024-09-05 DIAGNOSIS — E876 Hypokalemia: Secondary | ICD-10-CM | POA: Diagnosis not present

## 2024-09-05 DIAGNOSIS — E66812 Obesity, class 2: Secondary | ICD-10-CM | POA: Diagnosis not present

## 2024-09-05 LAB — MAGNESIUM: Magnesium: 2.3 mg/dL (ref 1.7–2.4)

## 2024-09-05 LAB — RENAL FUNCTION PANEL
Albumin: 3.1 g/dL — ABNORMAL LOW (ref 3.5–5.0)
Anion gap: 9 (ref 5–15)
BUN: 29 mg/dL — ABNORMAL HIGH (ref 8–23)
CO2: 30 mmol/L (ref 22–32)
Calcium: 9 mg/dL (ref 8.9–10.3)
Chloride: 100 mmol/L (ref 98–111)
Creatinine, Ser: 1.16 mg/dL — ABNORMAL HIGH (ref 0.44–1.00)
GFR, Estimated: 46 mL/min — ABNORMAL LOW (ref >60)
Glucose, Bld: 102 mg/dL — ABNORMAL HIGH (ref 70–99)
Phosphorus: 3.7 mg/dL (ref 2.5–4.6)
Potassium: 4 mmol/L (ref 3.5–5.1)
Sodium: 139 mmol/L (ref 135–145)

## 2024-09-05 LAB — CBC
HCT: 28.8 % — ABNORMAL LOW (ref 36.0–46.0)
Hemoglobin: 9 g/dL — ABNORMAL LOW (ref 12.0–15.0)
MCH: 29.6 pg (ref 26.0–34.0)
MCHC: 31.3 g/dL (ref 30.0–36.0)
MCV: 94.7 fL (ref 80.0–100.0)
Platelets: 348 K/uL (ref 150–400)
RBC: 3.04 MIL/uL — ABNORMAL LOW (ref 3.87–5.11)
RDW: 14.6 % (ref 11.5–15.5)
WBC: 7.8 K/uL (ref 4.0–10.5)
nRBC: 0 % (ref 0.0–0.2)

## 2024-09-05 SURGERY — REPAIR, TENDON, PATELLAR
Anesthesia: General | Site: Knee | Laterality: Right

## 2024-09-05 MED ORDER — OXYCODONE HCL 5 MG PO TABS
5.0000 mg | ORAL_TABLET | ORAL | Status: DC | PRN
  Filled 2024-09-05: qty 1

## 2024-09-05 MED ORDER — POLYETHYLENE GLYCOL 3350 17 G PO PACK: 17.0000 g | PACK | Freq: Every day | ORAL | Status: DC | PRN

## 2024-09-05 MED ORDER — METHOCARBAMOL 500 MG PO TABS
500.0000 mg | ORAL_TABLET | Freq: Four times a day (QID) | ORAL | Status: DC | PRN
  Administered 2024-09-06 – 2024-09-07 (×3): 500 mg via ORAL
  Filled 2024-09-05 (×4): qty 1

## 2024-09-05 MED ORDER — DIPHENHYDRAMINE HCL 12.5 MG/5ML PO ELIX: 12.5000 mg | ORAL_SOLUTION | ORAL | Status: DC | PRN

## 2024-09-05 MED ORDER — LIDOCAINE 2% (20 MG/ML) 5 ML SYRINGE
INTRAMUSCULAR | Status: DC | PRN
  Administered 2024-09-05: 110 mg via INTRAVENOUS
  Administered 2024-09-05: 60 mg via INTRAVENOUS

## 2024-09-05 MED ORDER — TRAMADOL HCL 50 MG PO TABS
50.0000 mg | ORAL_TABLET | Freq: Four times a day (QID) | ORAL | Status: DC | PRN
  Filled 2024-09-05: qty 2

## 2024-09-05 MED ORDER — FLEET ENEMA RE ENEM: 1.0000 | ENEMA | Freq: Once | RECTAL | Status: DC | PRN

## 2024-09-05 MED ORDER — PROPOFOL 10 MG/ML IV BOLUS
INTRAVENOUS | Status: AC
  Filled 2024-09-05: qty 20

## 2024-09-05 MED ORDER — ACETAMINOPHEN 500 MG PO TABS
1000.0000 mg | ORAL_TABLET | Freq: Four times a day (QID) | ORAL | Status: AC
  Administered 2024-09-05 – 2024-09-06 (×4): 1000 mg via ORAL
  Filled 2024-09-05 (×4): qty 2

## 2024-09-05 MED ORDER — HYDROMORPHONE HCL 1 MG/ML IJ SOLN
0.5000 mg | INTRAMUSCULAR | Status: DC | PRN
  Filled 2024-09-05: qty 1

## 2024-09-05 MED ORDER — FENTANYL CITRATE (PF) 250 MCG/5ML IJ SOLN
INTRAMUSCULAR | Status: DC | PRN
  Administered 2024-09-05: 50 ug via INTRAVENOUS
  Administered 2024-09-05 (×2): 25 ug via INTRAVENOUS

## 2024-09-05 MED ORDER — 0.9 % SODIUM CHLORIDE (POUR BTL) OPTIME
TOPICAL | Status: DC | PRN
  Administered 2024-09-05: 1000 mL

## 2024-09-05 MED ORDER — ROPIVACAINE HCL 5 MG/ML IJ SOLN
INTRAMUSCULAR | Status: DC | PRN
  Administered 2024-09-05: 30 mL via PERINEURAL

## 2024-09-05 MED ORDER — HYDROMORPHONE HCL 1 MG/ML IJ SOLN
INTRAMUSCULAR | Status: AC
  Filled 2024-09-05: qty 1

## 2024-09-05 MED ORDER — CEFAZOLIN SODIUM-DEXTROSE 2-4 GM/100ML-% IV SOLN
2.0000 g | Freq: Four times a day (QID) | INTRAVENOUS | Status: AC
  Administered 2024-09-06 (×2): 2 g via INTRAVENOUS
  Filled 2024-09-05 (×2): qty 100

## 2024-09-05 MED ORDER — MENTHOL 3 MG MT LOZG: 1.0000 | LOZENGE | OROMUCOSAL | Status: DC | PRN

## 2024-09-05 MED ORDER — METOCLOPRAMIDE HCL 5 MG/ML IJ SOLN: 5.0000 mg | Freq: Three times a day (TID) | INTRAMUSCULAR | Status: DC | PRN

## 2024-09-05 MED ORDER — FENTANYL CITRATE (PF) 100 MCG/2ML IJ SOLN
INTRAMUSCULAR | Status: AC
  Filled 2024-09-05: qty 2

## 2024-09-05 MED ORDER — DEXAMETHASONE SOD PHOSPHATE PF 10 MG/ML IJ SOLN: 8.0000 mg | Freq: Once | INTRAMUSCULAR | Status: DC

## 2024-09-05 MED ORDER — CEFAZOLIN SODIUM-DEXTROSE 2-4 GM/100ML-% IV SOLN
2.0000 g | INTRAVENOUS | Status: AC
  Administered 2024-09-05: 2 g via INTRAVENOUS
  Filled 2024-09-05: qty 100

## 2024-09-05 MED ORDER — BISACODYL 10 MG RE SUPP: 10.0000 mg | Freq: Every day | RECTAL | Status: DC | PRN

## 2024-09-05 MED ORDER — METHOCARBAMOL 1000 MG/10ML IJ SOLN: 500.0000 mg | Freq: Four times a day (QID) | INTRAMUSCULAR | Status: DC | PRN

## 2024-09-05 MED ORDER — DEXAMETHASONE SODIUM PHOSPHATE 4 MG/ML IJ SOLN
INTRAMUSCULAR | Status: DC | PRN
  Administered 2024-09-05: 5 mg via PERINEURAL

## 2024-09-05 MED ORDER — FENTANYL CITRATE (PF) 50 MCG/ML IJ SOSY
25.0000 ug | PREFILLED_SYRINGE | Freq: Once | INTRAMUSCULAR | Status: AC
  Administered 2024-09-05: 50 ug via INTRAVENOUS

## 2024-09-05 MED ORDER — METOCLOPRAMIDE HCL 5 MG PO TABS: 5.0000 mg | ORAL_TABLET | Freq: Three times a day (TID) | ORAL | Status: DC | PRN

## 2024-09-05 MED ORDER — CLONIDINE HCL (ANALGESIA) 100 MCG/ML EP SOLN
EPIDURAL | Status: DC | PRN
  Administered 2024-09-05: 80 ug

## 2024-09-05 MED ORDER — FENTANYL CITRATE (PF) 50 MCG/ML IJ SOSY
PREFILLED_SYRINGE | INTRAMUSCULAR | Status: AC
  Filled 2024-09-05: qty 2

## 2024-09-05 MED ORDER — ONDANSETRON HCL 4 MG PO TABS: 4.0000 mg | ORAL_TABLET | Freq: Four times a day (QID) | ORAL | Status: DC | PRN

## 2024-09-05 MED ORDER — LACTATED RINGERS IV SOLN: INTRAVENOUS | Status: DC

## 2024-09-05 MED ORDER — DOCUSATE SODIUM 100 MG PO CAPS
100.0000 mg | ORAL_CAPSULE | Freq: Two times a day (BID) | ORAL | Status: DC
  Administered 2024-09-06 – 2024-09-08 (×4): 100 mg via ORAL
  Filled 2024-09-05 (×4): qty 1

## 2024-09-05 MED ORDER — ONDANSETRON HCL 4 MG/2ML IJ SOLN
INTRAMUSCULAR | Status: DC | PRN
  Administered 2024-09-05: 4 mg via INTRAVENOUS

## 2024-09-05 MED ORDER — DEXAMETHASONE SOD PHOSPHATE PF 10 MG/ML IJ SOLN
INTRAMUSCULAR | Status: DC | PRN
  Administered 2024-09-05: 5 mg via INTRAVENOUS

## 2024-09-05 MED ORDER — ASPIRIN 81 MG PO CHEW
81.0000 mg | CHEWABLE_TABLET | Freq: Every day | ORAL | Status: DC
  Administered 2024-09-06 – 2024-09-08 (×3): 81 mg via ORAL
  Filled 2024-09-05 (×3): qty 1

## 2024-09-05 MED ORDER — PHENOL 1.4 % MT LIQD: 1.0000 | OROMUCOSAL | Status: DC | PRN

## 2024-09-05 MED ORDER — DROPERIDOL 2.5 MG/ML IJ SOLN: 0.6250 mg | Freq: Once | INTRAMUSCULAR | Status: DC | PRN

## 2024-09-05 MED ORDER — ACETAMINOPHEN 10 MG/ML IV SOLN
1000.0000 mg | Freq: Four times a day (QID) | INTRAVENOUS | Status: DC
  Administered 2024-09-05: 1000 mg via INTRAVENOUS
  Filled 2024-09-05: qty 100

## 2024-09-05 MED ORDER — SODIUM CHLORIDE 0.9 % IV SOLN: INTRAVENOUS | Status: DC

## 2024-09-05 MED ORDER — ACETAMINOPHEN 325 MG PO TABS
325.0000 mg | ORAL_TABLET | Freq: Four times a day (QID) | ORAL | Status: DC | PRN
  Administered 2024-09-06 – 2024-09-07 (×4): 650 mg via ORAL
  Filled 2024-09-05 (×5): qty 2

## 2024-09-05 MED ORDER — LACTATED RINGERS IV SOLN: INTRAVENOUS | Status: DC | PRN

## 2024-09-05 MED ORDER — ONDANSETRON HCL 4 MG/2ML IJ SOLN: 4.0000 mg | Freq: Four times a day (QID) | INTRAMUSCULAR | Status: DC | PRN

## 2024-09-05 MED ORDER — DEXAMETHASONE SOD PHOSPHATE PF 10 MG/ML IJ SOLN
10.0000 mg | Freq: Once | INTRAMUSCULAR | Status: AC
  Administered 2024-09-06: 10 mg via INTRAVENOUS

## 2024-09-05 MED ORDER — HYDROMORPHONE HCL 1 MG/ML IJ SOLN
0.2500 mg | INTRAMUSCULAR | Status: DC | PRN
  Administered 2024-09-05 (×2): 0.5 mg via INTRAVENOUS

## 2024-09-05 SURGICAL SUPPLY — 42 items
BAG COUNTER SPONGE SURGICOUNT (BAG) IMPLANT
BAG ZIPLOCK 12X15 (MISCELLANEOUS) IMPLANT
BANDAGE ESMARK 6X9 LF (GAUZE/BANDAGES/DRESSINGS) ×1 IMPLANT
BIT DRILL 2.8X128 (BIT) IMPLANT
BNDG ELASTIC 6INX 5YD STR LF (GAUZE/BANDAGES/DRESSINGS) ×1 IMPLANT
BNDG ELASTIC 6X10 VLCR STRL LF (GAUZE/BANDAGES/DRESSINGS) IMPLANT
COVER SURGICAL LIGHT HANDLE (MISCELLANEOUS) ×1 IMPLANT
CUFF TRNQT CYL 34X4.125X (TOURNIQUET CUFF) ×1 IMPLANT
DERMABOND ADVANCED .7 DNX12 (GAUZE/BANDAGES/DRESSINGS) IMPLANT
DRAPE INCISE IOBAN 66X45 STRL (DRAPES) IMPLANT
DRAPE POUCH INSTRU U-SHP 10X18 (DRAPES) ×1 IMPLANT
DRAPE U-SHAPE 47X51 STRL (DRAPES) ×1 IMPLANT
DRSG ADAPTIC 3X8 NADH LF (GAUZE/BANDAGES/DRESSINGS) ×1 IMPLANT
DRSG AQUACEL AG ADV 3.5X10 (GAUZE/BANDAGES/DRESSINGS) IMPLANT
DURAPREP 26ML APPLICATOR (WOUND CARE) ×1 IMPLANT
ELECT REM PT RETURN 15FT ADLT (MISCELLANEOUS) ×1 IMPLANT
GAUZE PAD ABD 8X10 STRL (GAUZE/BANDAGES/DRESSINGS) ×1 IMPLANT
GAUZE SPONGE 4X4 12PLY STRL (GAUZE/BANDAGES/DRESSINGS) ×1 IMPLANT
GLOVE BIO SURGEON STRL SZ 6.5 (GLOVE) ×2 IMPLANT
GLOVE BIO SURGEON STRL SZ7.5 (GLOVE) ×1 IMPLANT
GLOVE BIO SURGEON STRL SZ8 (GLOVE) ×1 IMPLANT
GLOVE BIOGEL PI IND STRL 6.5 (GLOVE) ×1 IMPLANT
GLOVE BIOGEL PI IND STRL 7.0 (GLOVE) ×1 IMPLANT
GLOVE BIOGEL PI IND STRL 8 (GLOVE) ×1 IMPLANT
GOWN SPEC L4 XLG W/TWL (GOWN DISPOSABLE) ×1 IMPLANT
GOWN STRL REUS W/ TWL LRG LVL3 (GOWN DISPOSABLE) ×3 IMPLANT
IMMOBILIZER KNEE 20 THIGH 36 (SOFTGOODS) ×1 IMPLANT
KIT TURNOVER KIT A (KITS) ×1 IMPLANT
MANIFOLD NEPTUNE II (INSTRUMENTS) ×1 IMPLANT
NDL MA TROC 1/2 (NEEDLE) ×1 IMPLANT
NDL MAYO CATGUT SZ4 TPR NDL (NEEDLE) IMPLANT
PACK TOTAL JOINT (CUSTOM PROCEDURE TRAY) ×1 IMPLANT
PADDING CAST COTTON 6X4 STRL (CAST SUPPLIES) ×2 IMPLANT
PASSER SUT SWANSON 36MM LOOP (INSTRUMENTS) ×1 IMPLANT
PROTECTOR NERVE ULNAR (MISCELLANEOUS) ×1 IMPLANT
STRIP CLOSURE SKIN 1/2X4 (GAUZE/BANDAGES/DRESSINGS) ×2 IMPLANT
SUT ETHIBOND NAB CT1 #1 30IN (SUTURE) ×2 IMPLANT
SUT MNCRL AB 4-0 PS2 18 (SUTURE) ×1 IMPLANT
SUT VIC AB 1 CT1 27XBRD ANTBC (SUTURE) IMPLANT
SUT VIC AB 2-0 CT1 TAPERPNT 27 (SUTURE) ×2 IMPLANT
SUTURE FIBERWR #2 38 T-5 BLUE (SUTURE) ×2 IMPLANT
TOWEL OR DSP ST BLU DLX 10/PK (DISPOSABLE) ×1 IMPLANT

## 2024-09-05 NOTE — [place] FL2 (Signed)
 [place]  MEDICAID FL2 LEVEL OF CARE FORM     IDENTIFICATION  Patient Name: Crystal Little Birthdate: December 26, 1940 Sex: female Admission Date (Current Location): 09/03/2024  [facility] and [place] Number:  Producer, Television/film/video and Address:  [facility],  [address], [place] [address]      Provider Number: [id number]  Attending Physician Name and Address:  Kathrin Mignon DASEN, MD  Relative Name and Phone Number:  MARCIEL, OFFENBERGER (Daughter)  [phone] (Mobile)    Current Level of Care: Hospital Recommended Level of Care: Skilled Nursing Facility Prior Approval Number:    Date Approved/Denied:   PASRR Number: [id number]  Discharge Plan: SNF    Current Diagnoses: Patient Active Problem List   Diagnosis Date Noted   Cellulitis of right knee 09/03/2024   AKI (acute kidney injury) 09/03/2024   Hypokalemia 09/03/2024   Generalized weakness 09/03/2024   Obesity, Class II, BMI 35-39.9 09/03/2024   Primary osteoarthritis of right knee 08/13/2024   Iron  deficiency anemia 06/20/2024   Obesity (BMI 30.0-34.9) 06/20/2024   Chronic venous insufficiency 03/20/2024   Lymphedema 03/20/2024   Peri-prosthetic patellar fracture 10/19/2023   Patellar tendon rupture, left, initial encounter 10/19/2023   Nocturnal leg cramps 06/20/2023   Leg swelling 11/09/2021   Sciatica, right side 05/17/2017   Special screening for malignant neoplasms, colon 04/26/2011   Diverticulosis of colon 04/26/2011   Dysphagia, pharyngoesophageal phase 01/08/2010   Enthesopathy of ankle and tarsus 12/24/2008   PLANTAR FASCIITIS, LEFT 12/24/2008   OA (osteoarthritis) of knee 05/09/2007   DEPENDENT EDEMA 05/09/2007   Personal history presenting hazards to health 05/09/2007   GERD 05/02/2007    Orientation RESPIRATION BLADDER Height & Weight     Self, Time, Situation, Place  Normal Continent, Indwelling catheter Weight: 90 kg Height:  5' 3 (160 cm)  BEHAVIORAL SYMPTOMS/MOOD NEUROLOGICAL  BOWEL NUTRITION STATUS      Continent Diet (regular)  AMBULATORY STATUS COMMUNICATION OF NEEDS Skin   Extensive Assist Verbally Surgical wounds (Right TKR)                       Personal Care Assistance Level of Assistance  Bathing, Feeding, Dressing Bathing Assistance: Limited assistance Feeding assistance: Limited assistance Dressing Assistance: Limited assistance     Functional Limitations Info  Sight, Hearing, Speech Sight Info: Adequate (reading glasses) Hearing Info: Impaired (hard of hearing) Speech Info: Adequate    SPECIAL CARE FACTORS FREQUENCY  PT (By licensed PT), OT (By licensed OT)     PT Frequency: 5x/wk OT Frequency: 5x/wk            Contractures Contractures Info: Not present    Additional Factors Info  Code Status, Allergies, Psychotropic Code Status Info: DNR Allergies Info: Amoxicillin  Psychotropic Info: see MAR         Current Medications (09/05/2024):  This is the current hospital active medication list Current Facility-Administered Medications  Medication Dose Route Frequency Provider Last Rate Last Admin   acetaminophen  (TYLENOL ) tablet 1,000 mg  1,000 mg Oral TID Amponsah, Prosper M, MD   1,000 mg at 09/04/24 2107   ALPRAZolam  (XANAX ) tablet 0.25 mg  0.25 mg Oral QHS PRN Amponsah, Prosper M, MD       bethanechol (URECHOLINE) tablet 25 mg  25 mg Oral TID Gonfa, Taye T, MD   25 mg at 09/05/24 0835   bisacodyl  (DULCOLAX) EC tablet 5 mg  5 mg Oral Daily PRN Amponsah, Prosper M, MD  Chlorhexidine  Gluconate Cloth 2 % PADS 6 each  6 each Topical Daily Kathrin Simmer T, MD   6 each at 09/05/24 0835   feeding supplement (ENSURE PLUS HIGH PROTEIN) liquid 237 mL  237 mL Oral TID BM Amponsah, Prosper M, MD       ferrous sulfate  tablet 325 mg  325 mg Oral Q breakfast Amponsah, Prosper M, MD   325 mg at 09/04/24 0802   linezolid  (ZYVOX ) IVPB 600 mg  600 mg Intravenous Q12H Amponsah, Prosper M, MD 300 mL/hr at 09/05/24 0841 600 mg at 09/05/24 0841    ondansetron  (ZOFRAN ) tablet 4 mg  4 mg Oral Q6H PRN Amponsah, Prosper M, MD       Or   ondansetron  (ZOFRAN ) injection 4 mg  4 mg Intravenous Q6H PRN Amponsah, Prosper M, MD       oxyCODONE  (Oxy IR/ROXICODONE ) immediate release tablet 5 mg  5 mg Oral Q6H PRN Amponsah, Prosper M, MD   5 mg at 09/04/24 2107   pantoprazole  (PROTONIX ) EC tablet 40 mg  40 mg Oral Daily Amponsah, Prosper M, MD   40 mg at 09/04/24 0802   senna-docusate (Senokot-S) tablet 1 tablet  1 tablet Oral QHS Lou Claretta HERO, MD   1 tablet at 09/04/24 2107     Discharge Medications: Please see discharge summary for a list of discharge medications.  Relevant Imaging Results:  Relevant Lab Results:   Additional Information SSN: [id number]  Alfonse JONELLE Rex, RN

## 2024-09-05 NOTE — Anesthesia Preprocedure Evaluation (Addendum)
 Anesthesia Evaluation  Patient identified by MRN, date of birth, ID band Patient awake    Reviewed: Allergy & Precautions, NPO status , Patient's Chart, lab work & pertinent test results  Airway Mallampati: III  TM Distance: >3 FB Neck ROM: Limited    Dental  (+) Dental Advisory Given, Teeth Intact   Pulmonary neg pulmonary ROS   Pulmonary exam normal breath sounds clear to auscultation       Cardiovascular + Peripheral Vascular Disease  Normal cardiovascular exam+ Valvular Problems/Murmurs MR  Rhythm:Regular Rate:Normal  Echo 2020  1. Left ventricular ejection fraction, by visual estimation, is 65 to  70%. The left ventricle has hyperdynamic function. There is no left  ventricular hypertrophy.   2. Left ventricular diastolic parameters are consistent with Grade I  diastolic dysfunction (impaired relaxation).   3. Global right ventricle has normal systolic function.The right  ventricular size is normal. No increase in right ventricular wall  thickness.   4. Left atrial size was mildly dilated.   5. Right atrial size was normal.   6. The mitral valve is normal in structure. Mild mitral valve  regurgitation. No evidence of mitral stenosis.   7. The tricuspid valve is normal in structure. Tricuspid valve  regurgitation is not demonstrated.   8. The aortic valve is normal in structure. Aortic valve regurgitation is  not visualized. No evidence of aortic valve sclerosis or stenosis.   9. The pulmonic valve was normal in structure. Pulmonic valve  regurgitation is not visualized.  10. Aneurysm of the ascending aorta, measuring 42 mm.  11. The inferior vena cava is normal in size with greater than 50%  respiratory variability, suggesting right atrial pressure of 3 mmHg.  12. The average left ventricular global longitudinal strain is -19.2 %.      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  Reproductive/Obstetrics                              Anesthesia Physical Anesthesia Plan  ASA: 3  Anesthesia Plan: General   Post-op Pain Management: Regional block* and Ofirmev  IV (intra-op)*   Induction: Intravenous  PONV Risk Score and Plan: 4 or greater and TIVA, Dexamethasone , Ondansetron , Treatment may vary due to age or medical condition and Propofol  infusion  Airway Management Planned: LMA  Additional Equipment:   Intra-op Plan:   Post-operative Plan: Extubation in OR  Informed Consent: I have reviewed the patients History and Physical, chart, labs and discussed the procedure including the risks, benefits and alternatives for the proposed anesthesia with the patient or authorized representative who has indicated his/her understanding and acceptance.     Dental advisory given  Plan Discussed with: CRNA  Anesthesia Plan Comments:          Anesthesia Quick Evaluation

## 2024-09-05 NOTE — TOC Initial Note (Signed)
 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  Patient reports she is agreeable to return to [facility] if PT recommendation.    Plan for surgical repair of ruptured patellar tendon by orthopedic surgery , PT/OT eval postoperative. INPT CM will follow for dc needs.          Expected Discharge Plan: Skilled Nursing Facility Barriers to Discharge: Continued Medical Work up   Patient Goals and CMS Choice Patient states their goals for this hospitalization and ongoing recovery are:: anticipate short term rehab at [facility]          Expected Discharge Plan and Services       Living arrangements for the past 2 months: Single Family Home                                      Prior Living Arrangements/Services Living arrangements for the past 2 months: Single Family Home Lives with:: Self Patient language and need for interpreter reviewed:: Yes Do you feel safe going back to the place where you live?: Yes      Need for Family Participation in Patient Care: Yes (Comment) Care giver support system in place?: Yes (comment) Current home services: DME (RW) Criminal Activity/Legal Involvement Pertinent to Current Situation/Hospitalization: No - Comment as needed  Activities of Daily Living   ADL Screening (condition at time of admission) Independently performs ADLs?: No Does the patient have a NEW difficulty with bathing/dressing/toileting/self-feeding that is expected to last >3 days?: Yes (Initiates electronic notice to provider for possible OT consult) Does the patient have a NEW difficulty  with getting in/out of bed, walking, or climbing stairs that is expected to last >3 days?: Yes (Initiates electronic notice to provider for possible PT consult) Does the patient have a NEW difficulty with communication that is expected to last >3 days?: No Is the patient deaf or have difficulty hearing?: No Does the patient have difficulty seeing, even when wearing glasses/contacts?: No Does the patient have difficulty concentrating, remembering, or making decisions?: No  Permission Sought/Granted                  Emotional Assessment Appearance:: Appears stated age Attitude/Demeanor/Rapport: Engaged Affect (typically observed): Accepting Orientation: : Oriented to Self, Oriented to Place, Oriented to  Time Alcohol / Substance Use: Not Applicable Psych Involvement: No (comment)  Admission diagnosis:  Hypokalemia [E87.6] Weakness [R53.1] Cellulitis of right knee [L03.115] Cellulitis of right lower extremity [L03.115] AKI (acute kidney injury) [N17.9] Patient Active Problem List   Diagnosis Date Noted   Cellulitis of right knee 09/03/2024   AKI (acute kidney injury) 09/03/2024   Hypokalemia 09/03/2024   Generalized weakness 09/03/2024   Obesity, Class II, BMI 35-39.9 09/03/2024   Primary osteoarthritis of right knee 08/13/2024   Iron  deficiency anemia 06/20/2024   Obesity (BMI 30.0-34.9) 06/20/2024   Chronic venous insufficiency 03/20/2024   Lymphedema 03/20/2024   Peri-prosthetic patellar fracture 10/19/2023   Patellar tendon rupture, left, initial encounter 10/19/2023   Nocturnal leg  cramps 06/20/2023   Leg swelling 11/09/2021   Sciatica, right side 05/17/2017   Special screening for malignant neoplasms, colon 04/26/2011   Diverticulosis of colon 04/26/2011   Dysphagia, pharyngoesophageal phase 01/08/2010   Enthesopathy of ankle and tarsus 12/24/2008   PLANTAR FASCIITIS, LEFT 12/24/2008   OA (osteoarthritis) of knee 05/09/2007   DEPENDENT EDEMA 05/09/2007   Personal  history presenting hazards to health 05/09/2007   GERD 05/02/2007   PCP:  Johnny Garnette LABOR, MD Pharmacy:   [id number] [address] [place], [place] - [address] [place] [address] Phone: [phone] Fax: [phone]     Social Drivers of Health (SDOH) Social History: SDOH Screenings   Food Insecurity: No Food Insecurity (09/03/2024)  Housing: Low Risk  (09/03/2024)  Transportation Needs: No Transportation Needs (09/03/2024)  Utilities: Not At Risk (09/03/2024)  Alcohol Screen: Low Risk  (06/13/2023)  Depression (PHQ2-9): Low Risk  (06/20/2024)  Financial Resource Strain: Low Risk  (06/13/2023)  Physical Activity: Insufficiently Active (06/13/2023)  Social Connections: Moderately Integrated (09/03/2024)  Stress: No Stress Concern Present (06/13/2023)  Tobacco Use: Low Risk  (09/03/2024)  Health Literacy: Adequate Health Literacy (06/13/2023)   SDOH Interventions:     Readmission Risk Interventions    09/05/2024   10:33 AM  Readmission Risk Prevention Plan  Transportation Screening Complete  PCP or Specialist Appt within 5-7 Days Complete  Home Care Screening Complete  Medication Review (RN CM) Complete

## 2024-09-05 NOTE — Progress Notes (Signed)
 PROGRESS NOTE  Crystal Little FMW:[id number] DOB: 08/04/1941   PCP: Johnny Garnette LABOR, MD  Patient is from: Home.  Lives alone.  Has rolling walker  DOA: 09/03/2024 LOS: 2  Chief complaints Chief Complaint  Patient presents with   Weakness     Brief Narrative / Interim history: 83 year old F with PMH of recent right TKA on 08/13/2024, PVD, lymphedema, IBS, anxiety, IDA and class II obesity presenting with increased right knee pain, redness and increased warmth to touch, and admitted with right knee cellulitis.   In ED, stable vitals.  BBC 9.8.  X-ray of right knee showed TKA in anatomical position with soft tissue swelling around the knee and osseous fragments anterior to the distal femur on the lateral view with a slightly high riding patella.  Patient was started on IV fluid and IV vancomycin.  Orthopedic surgery consulted and she was admitted for further care.  She was started on Zyvox.  MRI of right knee raised concern for ruptured patellar tendon and a small to moderate effusion.  Plan for surgical repair of patellar tendon  Subjective: Seen and examined earlier this morning.  No major events overnight or this morning.  No complaints.  She was told that she needs surgery for ruptured patellar tendon.    Assessment and plan: Cellulitis of the right knee: Presents with redness, swelling and pain.  Cultures NGTD.  Improving. 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  Right knee x-ray as above. Right patella tendon rupture-noted on MRI. -Plan for surgical repair of ruptured patellar tendon by orthopedic surgery -Continue Zyvox -Tylenol  and oxycodone  for pain control -PT/OT after surgery.   AKI: b/l Cr ~0.7-0.8.  AKI likely due to dehydration and torsemide .  Also has urinary tension.  Renal ultrasound without hydronephrosis or obstructive etiology.  AKI improved. Recent Labs    10/20/23 0345 06/20/24 1406 07/31/24 1445 08/14/24 1051 09/03/24 1700 09/04/24 0450  09/05/24 0420  BUN 17 15 17 16  29* 28* 29*  CREATININE 0.80 0.69 0.76 0.84 1.28* 1.34* 1.16*  -Continue Foley catheter -Avoid nephrotoxic meds. -Continue holding torsemide   Anemia of renal disease: Stable. Recent Labs    10/13/23 0858 10/20/23 0345 10/21/23 0356 06/20/24 1406 07/31/24 1445 08/14/24 1051 08/15/24 0338 09/03/24 1700 09/04/24 0450 09/05/24 0420  HGB 11.8* 10.4* 9.6* 12.2 12.1 11.1* 9.9* 10.5* 9.6* 9.0*  - Continue monitoring  Acute urinary retention: Recurrent urinary retention with urine output of 900 cc on 12/2.  - Continue Foley catheter at least 4 weeks - Continue Urecholine. - May need outpatient follow-up with urology  Lymphedema?  Stable.  Reports she uses compression socks and lymphedema pump at home - Apply TED hose.   Hypokalemia -Monitor replenish K and Mg as appropriate  GERD - Continue PPI   Anxiety: Stable - Continue as needed Xanax    Generalized weakness - PT/OT eval  Pyuria/bacteriuria: Patient denies dysuria, frequency or urgency.   Class II obesity Body mass index is 35.15 kg/m.           DVT prophylaxis:  Place TED hose Start: 09/03/24 2019  Code Status: DNR Family Communication: Updated patient's son at bedside Level of care: Med-Surg Status is: Inpatient Remains inpatient appropriate because: Right knee cellulitis and AKI   Final disposition: Home   55 minutes with more than 50% spent in reviewing records, counseling patient/family and coordinating care.  Consultants:  Orthopedic surgery  Procedures: None  Microbiology summarized: Blood cultures NGTD  Objective: Vitals:   09/04/24 2150 09/05/24 0136  09/05/24 0500 09/05/24 0529  BP: (!) 127/45 (!) 124/50  (!) 148/67  Pulse: 80 75  76  Resp: 16 16  16   Temp: 98 F (36.7 C) 97.7 F (36.5 C)  98.1 F (36.7 C)  TempSrc: Oral   Oral  SpO2: 99% 94%  97%  Weight:   90 kg   Height:        Examination:  GENERAL: No apparent distress.   Nontoxic. HEENT: MMM.  Vision and hearing grossly intact.  NECK: Supple.  No apparent JVD.  RESP:  No IWOB.  Fair aeration bilaterally. CVS:  RRR. Heart sounds normal.  ABD/GI/GU: BS+. Abd soft, NTND.  Foley catheter in place. MSK/EXT:  Moves extremities.  Notable swelling and erythema around right knee.  No increased warmth to touch.  Fair range of motion in right knee. SKIN: As above. NEURO: AA.  Oriented appropriately.  No apparent focal neuro deficit. PSYCH: Calm. Normal affect.   Sch Meds:  Scheduled Meds:  acetaminophen   1,000 mg Oral TID   bethanechol  25 mg Oral TID   Chlorhexidine  Gluconate Cloth  6 each Topical Daily   feeding supplement  237 mL Oral TID BM   ferrous sulfate   325 mg Oral Q breakfast   pantoprazole   40 mg Oral Daily   senna-docusate  1 tablet Oral QHS   Continuous Infusions:  linezolid (ZYVOX) IV 600 mg (09/05/24 0841)   PRN Meds:.ALPRAZolam , bisacodyl , ondansetron  **OR** ondansetron  (ZOFRAN ) IV, oxyCODONE   Antimicrobials: Anti-infectives (From admission, onward)    Start     Dose/Rate Route Frequency Ordered Stop   09/04/24 2200  linezolid (ZYVOX) IVPB 600 mg        600 mg 300 mL/hr over 60 Minutes Intravenous Every 12 hours 09/03/24 2023 09/11/24 2159   09/03/24 1945  vancomycin (VANCOREADY) IVPB 1750 mg/350 mL        1,750 mg 175 mL/hr over 120 Minutes Intravenous  Once 09/03/24 1914 09/04/24 0803        I have personally reviewed the following labs and images: CBC: Recent Labs  Lab 09/03/24 1700 09/04/24 0450 09/05/24 0420  WBC 9.8 9.3 7.8  NEUTROABS 6.8  --   --   HGB 10.5* 9.6* 9.0*  HCT 33.1* 30.5* 28.8*  MCV 91.9 92.4 94.7  PLT 484* 394 348   BMP &GFR Recent Labs  Lab 09/03/24 1700 09/04/24 0450 09/05/24 0420  NA 137 139 139  K 3.1* 3.6 4.0  CL 90* 96* 100  CO2 35* 33* 30  GLUCOSE 94 106* 102*  BUN 29* 28* 29*  CREATININE 1.28* 1.34* 1.16*  CALCIUM 10.2 9.2 9.0  MG  --  2.5* 2.3  PHOS  --  2.7 3.7   Estimated  Creatinine Clearance: 38.4 mL/min (A) (by C-G formula based on SCr of 1.16 mg/dL (H)). Liver & Pancreas: Recent Labs  Lab 09/03/24 1700 09/05/24 0420  AST 19  --   ALT 7  --   ALKPHOS 131*  --   BILITOT 0.9  --   PROT 8.2*  --   ALBUMIN 4.1 3.1*   No results for input(s): LIPASE, AMYLASE in the last 168 hours. No results for input(s): AMMONIA in the last 168 hours. Diabetic: No results for input(s): HGBA1C in the last 72 hours. No results for input(s): GLUCAP in the last 168 hours. Cardiac Enzymes: No results for input(s): CKTOTAL, CKMB, CKMBINDEX, TROPONINI in the last 168 hours. No results for input(s): PROBNP in the last 8760 hours. Coagulation Profile:  No results for input(s): INR, PROTIME in the last 168 hours. Thyroid  Function Tests: No results for input(s): TSH, T4TOTAL, FREET4, T3FREE, THYROIDAB in the last 72 hours. Lipid Profile: No results for input(s): CHOL, HDL, LDLCALC, TRIG, CHOLHDL, LDLDIRECT in the last 72 hours. Anemia Panel: No results for input(s): VITAMINB12, FOLATE, FERRITIN, TIBC, IRON , RETICCTPCT in the last 72 hours. Urine analysis:    Component Value Date/Time   COLORURINE YELLOW 09/04/2024 0530   APPEARANCEUR HAZY (A) 09/04/2024 0530   LABSPEC 1.011 09/04/2024 0530   PHURINE 5.0 09/04/2024 0530   GLUCOSEU NEGATIVE 09/04/2024 0530   HGBUR NEGATIVE 09/04/2024 0530   HGBUR negative 02/06/2008 0947   BILIRUBINUR NEGATIVE 09/04/2024 0530   BILIRUBINUR n 10/25/2018 1453   KETONESUR NEGATIVE 09/04/2024 0530   PROTEINUR NEGATIVE 09/04/2024 0530   UROBILINOGEN 0.2 10/25/2018 1453   UROBILINOGEN 0.2 02/06/2008 0947   NITRITE NEGATIVE 09/04/2024 0530   LEUKOCYTESUR LARGE (A) 09/04/2024 0530   Sepsis Labs: Invalid input(s): PROCALCITONIN, LACTICIDVEN  Microbiology: Recent Results (from the past 240 hours)  Culture, blood (routine x 2)     Status: None (Preliminary result)   Collection  Time: 09/03/24  5:00 PM   Specimen: BLOOD  Result Value Ref Range Status   Specimen Description   Final    BLOOD SITE NOT SPECIFIED Performed at [facility], [address], [place], [place] [address]    Special Requests   Final    BOTTLES DRAWN AEROBIC AND ANAEROBIC Blood Culture results may not be optimal due to an inadequate volume of blood received in culture bottles Performed at [facility], [address], [place], [place] [address]    Culture   Final    NO GROWTH 2 DAYS Performed at [facility], [address], [place], [place] [address]    Report Status PENDING  Incomplete  Culture, blood (routine x 2)     Status: None (Preliminary result)   Collection Time: 09/03/24  9:15 PM   Specimen: BLOOD LEFT ARM  Result Value Ref Range Status   Specimen Description   Final    BLOOD LEFT ARM Performed at [facility], [address], [place], [place] [address]    Special Requests   Final    BOTTLES DRAWN AEROBIC ONLY Blood Culture results may not be optimal due to an inadequate volume of blood received in culture bottles Performed at [facility], [address] [place], [place] [address]    Culture   Final    NO GROWTH 1 DAY Performed at [facility], [address] [place], [place] [address]    Report Status PENDING  Incomplete    Radiology Studies: US  RENAL Result Date: 09/04/2024 CLINICAL DATA:  [id number] AKI (acute kidney injury) [id number] EXAM: RENAL / URINARY TRACT ULTRASOUND COMPLETE COMPARISON:  07/06/2004 FINDINGS: Right Kidney: Renal measurements: 10.9 x 4.7 x 5.1 cm = volume: 136 mL.Normal echogenicity. No mass. No hydronephrosis or nephrolithiasis. Left Kidney: Renal measurements: 9.9 x 5.1 x 4.9 cm = volume: 131 mL. Normal echogenicity. No mass. No hydronephrosis or nephrolithiasis. Bladder: Appears normal for degree of bladder distention. Other: None. IMPRESSION: No hydronephrosis  or nephrolithiasis. Electronically Signed   By: Rogelia Myers M.D.   On: 09/04/2024 20:32   MR KNEE RIGHT WO CONTRAST Result Date: 09/04/2024 CLINICAL DATA:  Knee replacement, soft-tissue abnormality suspected Knee replacement surgery yesterday. Cellulitis. Evaluate patellar tendon. EXAM: MRI OF THE RIGHT KNEE WITHOUT CONTRAST TECHNIQUE:  Multiplanar, multisequence MR imaging of the knee was performed. No intravenous contrast was administered. COMPARISON:  Radiographs 09/03/2024. No other comparison studies are available. Report from a remote MRI 12/17/2011 is correlated. FINDINGS: Bones/Joint/Cartilage There is significant susceptibility artifact related to the total knee arthroplasty, limiting assessment of the adjacent osseous structures. The ossific density noted inferior to the patella on the radiographs is not well visualized and could reflect an avulsion fracture of the lower pole of the patella or the tibial tubercle. As correlated with recent radiographs, no other evidence of acute fracture, dislocation or hardware loosening. There is a small to moderate knee joint effusion. Ligaments Largely obscured by artifact. The collateral ligaments are grossly intact. Muscles and Tendons As noted on the radiographs, the patella is high-riding. The distal quadriceps tendon appears intact. The patellar tendon is poorly visualized and is likely ruptured, although partially obscured by artifact from the knee arthroplasty. Generalized muscular atrophy about the knee. Soft tissue Anterior knee incision noted with edema and ill-defined fluid inferior to the patella and anterior to the distal femur and proximal tibia. IMPRESSION: 1. The patellar tendon is poorly visualized and is likely ruptured, although partially obscured by artifact from the knee arthroplasty. 2. The ossific density noted inferior to the patella on the radiographs is not well visualized and could reflect an avulsion fracture of the lower pole of the  patella or the tibial tubercle. CT may be helpful for further evaluation and localization of the patellar tendon rupture. 3. Small to moderate knee joint effusion. 4. No other evidence of acute fracture, dislocation or hardware loosening. Electronically Signed   By: Elsie Perone M.D.   On: 09/04/2024 18:42      Anniebell Bedore T. Jerimiah Wolman [facility]  If 7PM-7AM, please contact night-coverage [email or web address] 09/05/2024, 9:57 AM

## 2024-09-05 NOTE — Brief Op Note (Signed)
 09/03/2024 - 09/05/2024  7:40 PM  PATIENT:  Crystal Little  83 y.o. female  PRE-OPERATIVE DIAGNOSIS:  RIGHT PATELLA TENDON RUPTURE  POST-OPERATIVE DIAGNOSIS:  RIGHT PATELLA TENDON RUPTURE  PROCEDURE:  Procedure(s): REPAIR, TENDON, PATELLAR (Right)  SURGEON:  Surgeons and Role:    DEWAINE Melodi Lerner, MD - Primary  PHYSICIAN ASSISTANT:   ASSISTANTS: Roxie Mess, PA-C   ANESTHESIA:   regional and general  EBL:  10 ml  BLOOD ADMINISTERED:none  DRAINS: none   LOCAL MEDICATIONS USED:  NONE  COUNTS:  YES  TOURNIQUET:  30 minutes @ 300 mm Hg  DICTATION: .Other Dictation: Dictation Number [id number]  PLAN OF CARE: Admit to inpatient   PATIENT DISPOSITION:  PACU - hemodynamically stable.

## 2024-09-05 NOTE — Interval H&P Note (Signed)
 History and Physical Interval Note:  09/05/2024 3:46 PM  Crystal Little  has presented today for surgery, with the diagnosis of RIGHT PATELLA TENDON RUPTURE.  The various methods of treatment have been discussed with the patient and family. After consideration of risks, benefits and other options for treatment, the patient has consented to  Procedure(s): REPAIR, TENDON, PATELLAR (Left) as a surgical intervention.  The patient's history has been reviewed, patient examined, no change in status, stable for surgery.  I have reviewed the patient's chart and labs.  Questions were answered to the patient's satisfaction.     Dempsey Daphane Odekirk

## 2024-09-05 NOTE — Progress Notes (Signed)
 Subjective: I saw Crystal Little earlier this AM and examined her knee and she could not do a SLR. There was also a palpable defect in the extensor mechanism (patellar tendon).   Objective: Vital signs in last 24 hours: Temp:  [97.7 F (36.5 C)-98.2 F (36.8 C)] 98.2 F (36.8 C) (12/03 1315) Pulse Rate:  [73-80] 78 (12/03 1630) Resp:  [10-20] 10 (12/03 1630) BP: (114-148)/(45-67) 139/58 (12/03 1630) SpO2:  [94 %-99 %] 96 % (12/03 1630) Weight:  [86.2 kg-90 kg] 86.2 kg (12/03 1545)  Intake/Output from previous day: 12/02 0701 - 12/03 0700 In: 1820 [P.O.:1520; IV Piggyback:300] Out: 1600 [Urine:1600] Intake/Output this shift: Total I/O In: 300 [IV Piggyback:300] Out: 700 [Urine:700]  Recent Labs    09/03/24 1700 09/04/24 0450 09/05/24 0420  HGB 10.5* 9.6* 9.0*   Recent Labs    09/04/24 0450 09/05/24 0420  WBC 9.3 7.8  RBC 3.30* 3.04*  HCT 30.5* 28.8*  PLT 394 348   Recent Labs    09/04/24 0450 09/05/24 0420  NA 139 139  K 3.6 4.0  CL 96* 100  CO2 33* 30  BUN 28* 29*  CREATININE 1.34* 1.16*  GLUCOSE 106* 102*  CALCIUM 9.2 9.0   No results for input(s): LABPT, INR in the last 72 hours.  RLE- No signs of infection Can not do straight leg raise Palpable defect patellar tendon    Assessment/Plan: Right patellar tendon rupture- She has had an atraumatic tendon rupture  Plan on surgery this evening   Dempsey Moan 09/05/2024, 6:14 PM

## 2024-09-05 NOTE — Op Note (Unsigned)
 NAME: Crystal Little, Crystal Little MEDICAL RECORD NO: [id number] ACCOUNT NO: [id number] DATE OF BIRTH: 01/15/1941 FACILITY: [facility] LOCATION: [id number] PHYSICIAN: Dempsey GAILS. Klarisa Barman, MD  Operative Report   DATE OF PROCEDURE: 09/05/2024  PREOPERATIVE DIAGNOSIS:  Right patellar tendon rupture.  POSTOPERATIVE DIAGNOSIS:  Right patellar tendon rupture.  PROCEDURE PERFORMED:  Right patellar tendon repair.  SURGEON:  Dempsey GAILS. Karem Farha, MD  ASSISTANT:  Bari Mess, PA-C  ANESTHESIA:  General and adductor canal block.  ESTIMATED BLOOD LOSS:  Minimal.  DRAINS:  None.  TOURNIQUET TIME:  30 minutes at 300 mmHg.  COMPLICATIONS:  None.  CONDITION:  Stable to recovery.  BRIEF CLINICAL NOTE:  The patient is an 83 year old female who underwent a right total knee arthroplasty approximately 2.5 weeks ago.  She was doing well until 5 days ago when she noticed weakness and inability to stand or bear weight well on that right  lower extremity.  2 days ago it was noted by her home health nurse that she had redness in the knee and they were concerned about infection.  She was sent to the Emergency Room and admitted to the medical service.  There was a question of cellulitis,  which it did not appear to be a cellulitis.  Of note, she cannot actively extend the knee all the way and an x-ray showed patella alta.  This is all consistent with a probable patellar tendon rupture.  An MRI scan was concerning for a tendon rupture.   She presents now for patellar tendon repair.  DESCRIPTION OF PROCEDURE:  After successful administration of adductor canal block and general anesthetic, a tourniquet was placed on her right thigh and the right lower extremity was prepped and draped in the usual sterile fashion.  The extremity was  wrapped in an Esmarch and the tourniquet inflated to 300 mmHg.  A midline incision was made with a 10 blade through subcutaneous tissue.  Immediately upon going in the subcutaneous tissue, it was  obvious that she had a patellar tendon avulsion.  The  tendon avulsed off of the tibial tubercle.  The tendon substance was intact, which was encouraging.  The medial retinacular repair was also disrupted from the tubercle all the way up to the superior pole of the patella.  She also had torn some of the  lateral retinaculum just inferior to the patella and adjacent to the tendon.  I thoroughly irrigated the joint with 1 liter of saline to remove the hematoma.  We then passed 2 #1 FiberWire sutures through the tendon, 1 on the medial half, the other on  the lateral half, starting proximally and then coursing distally in a Bunnell type weave.  I drilled 3 holes in the tibial tubercle and passed the sutures through these holes.  I was able to advance the tendon back down to the tubercle and then tied the  sutures together, and I felt like I had a very stable repair.  I was able to flex the knee 90 degrees without any movement of the suture line and without any excess tension on the tendon.  I then repaired the retinacular tear on the lateral side, which  further enhanced the tendon repair.  I irrigated further and then closed the medial arthrotomy with #1 Ethibond suture and also with an over-sewing with a 0 Stratafix suture.  I allowed her to flex to 90 and I did not let it flex beyond that.  She had a  very stable repair at 90 degrees.  The tourniquet  was then released, total time approximately 30 minutes.  The subcutaneous tissue was closed with interrupted 2-0 Vicryl and subcuticular running 4-0 Monocryl.  The incision was cleaned and dried and  Dermabond and a sterile dressing applied.  She was then awakened and transported to recovery in stable condition.  Note that the surgical assistance was of medical necessity for this procedure.  An assistant was necessary for retraction of vital structures and for assistance in the suturing and repair of the tendon.    SUJ D: 09/05/2024 7:46:32 pm T:  09/05/2024 10:38:00 pm  JOB: [id number]/ [id number]

## 2024-09-05 NOTE — Transfer of Care (Signed)
 Immediate Anesthesia Transfer of Care Note  Patient: Crystal Little  Procedure(s) Performed: REPAIR, TENDON, PATELLAR (Right: Knee)  Patient Location: PACU  Anesthesia Type:General  Level of Consciousness: drowsy  Airway & Oxygen Therapy: Patient Spontanous Breathing and Patient connected to face mask oxygen  Post-op Assessment: Report given to RN and Post -op Vital signs reviewed and stable  Post vital signs: Reviewed and stable  Last Vitals:  Vitals Value Taken Time  BP 136/75 09/05/24 20:15  Temp    Pulse 83 09/05/24 20:22  Resp 20 09/05/24 20:22  SpO2 98 % 09/05/24 20:22  Vitals shown include unfiled device data.  Last Pain:  Vitals:   09/05/24 1630  TempSrc:   PainSc: 0-No pain         Complications: No notable events documented.

## 2024-09-05 NOTE — Progress Notes (Signed)
 PT Cancellation Note  Patient Details Name: Crystal Little MRN: [id number] DOB: 09-May-1941   Cancelled Treatment:    Reason Eval/Treat Not Completed: Medical issues which prohibited therapy   PT on hold until cleared by orthopedics to continue mobility with Possible Right quad tendon pathology.  Darice Potters PT Acute Rehabilitation Services Office [phone]  Potters Darice Norris 09/05/2024, 6:41 AM

## 2024-09-05 NOTE — Anesthesia Procedure Notes (Signed)
 Procedure Name: LMA Insertion Date/Time: 09/05/2024 6:41 PM  Performed by: Petrina Melby, CRNAPre-anesthesia Checklist: Patient identified, Emergency Drugs available, Suction available and Patient being monitored Patient Re-evaluated:Patient Re-evaluated prior to induction Oxygen Delivery Method: Circle system utilized Preoxygenation: Pre-oxygenation with 100% oxygen Induction Type: IV induction Ventilation: Mask ventilation without difficulty LMA: LMA inserted LMA Size: 4.0 Number of attempts: 1 Placement Confirmation: positive ETCO2 and breath sounds checked- equal and bilateral Tube secured with: Tape Dental Injury: Teeth and Oropharynx as per pre-operative assessment

## 2024-09-05 NOTE — Anesthesia Postprocedure Evaluation (Signed)
 Anesthesia Post Note  Patient: Crystal Little  Procedure(s) Performed: REPAIR, TENDON, PATELLAR (Right: Knee)     Patient location during evaluation: PACU Anesthesia Type: General Level of consciousness: sedated and patient cooperative Pain management: pain level controlled Vital Signs Assessment: post-procedure vital signs reviewed and stable Respiratory status: spontaneous breathing Cardiovascular status: stable Anesthetic complications: no   No notable events documented.  Last Vitals:  Vitals:   09/05/24 2304 09/05/24 2309  BP: 127/67 130/64  Pulse: 77 75  Resp: 16 18  Temp: 36.7 C 36.6 C  SpO2: 99% 99%    Last Pain:  Vitals:   09/05/24 2309  TempSrc: Oral  PainSc:                  Norleen Pope

## 2024-09-05 NOTE — Anesthesia Procedure Notes (Addendum)
 Anesthesia Regional Block: Adductor canal block   Pre-Anesthetic Checklist: , timeout performed,  Correct Patient, Correct Site, Correct Laterality,  Correct Procedure, Correct Position, site marked,  Risks and benefits discussed,  Surgical consent,  Pre-op evaluation,  At surgeon's request and post-op pain management  Laterality: Lower and Right  Prep: chloraprep       Needles:  Injection technique: Single-shot  Needle Type: Stimiplex     Needle Length: 9cm  Needle Gauge: 21     Additional Needles:   Procedures:,,,, ultrasound used (permanent image in chart),,    Narrative:  Start time: 09/05/2024 4:15 PM End time: 09/05/2024 4:35 PM Injection made incrementally with aspirations every 5 mL.  Performed by: Personally  Anesthesiologist: Darlyn Rush, MD  Additional Notes: BP cuff, EKG monitors applied. Sedation begun. Artery and nerve location verified with ultrasound. Anesthetic injected incrementally (5ml), slowly, and after negative aspirations under direct u/s guidance. Good fascial/perineural spread. Tolerated well.

## 2024-09-06 ENCOUNTER — Encounter (HOSPITAL_COMMUNITY): Payer: Self-pay | Admitting: Orthopedic Surgery

## 2024-09-06 DIAGNOSIS — E876 Hypokalemia: Secondary | ICD-10-CM | POA: Diagnosis not present

## 2024-09-06 DIAGNOSIS — R531 Weakness: Secondary | ICD-10-CM | POA: Diagnosis not present

## 2024-09-06 DIAGNOSIS — L03115 Cellulitis of right lower limb: Secondary | ICD-10-CM | POA: Diagnosis not present

## 2024-09-06 DIAGNOSIS — E66812 Obesity, class 2: Secondary | ICD-10-CM | POA: Diagnosis not present

## 2024-09-06 LAB — RENAL FUNCTION PANEL
Albumin: 3.3 g/dL — ABNORMAL LOW (ref 3.5–5.0)
Anion gap: 7 (ref 5–15)
BUN: 14 mg/dL (ref 8–23)
CO2: 29 mmol/L (ref 22–32)
Calcium: 9.3 mg/dL (ref 8.9–10.3)
Chloride: 101 mmol/L (ref 98–111)
Creatinine, Ser: 0.75 mg/dL (ref 0.44–1.00)
GFR, Estimated: >60 mL/min (ref >60)
Glucose, Bld: 122 mg/dL — ABNORMAL HIGH (ref 70–99)
Phosphorus: 2.6 mg/dL (ref 2.5–4.6)
Potassium: 4.2 mmol/L (ref 3.5–5.1)
Sodium: 136 mmol/L (ref 135–145)

## 2024-09-06 LAB — CBC
HCT: 31.4 % — ABNORMAL LOW (ref 36.0–46.0)
Hemoglobin: 9.9 g/dL — ABNORMAL LOW (ref 12.0–15.0)
MCH: 29.1 pg (ref 26.0–34.0)
MCHC: 31.5 g/dL (ref 30.0–36.0)
MCV: 92.4 fL (ref 80.0–100.0)
Platelets: 352 K/uL (ref 150–400)
RBC: 3.4 MIL/uL — ABNORMAL LOW (ref 3.87–5.11)
RDW: 14 % (ref 11.5–15.5)
WBC: 5.7 K/uL (ref 4.0–10.5)
nRBC: 0 % (ref 0.0–0.2)

## 2024-09-06 LAB — BASIC METABOLIC PANEL WITH GFR
Anion gap: 7 (ref 5–15)
BUN: 14 mg/dL (ref 8–23)
CO2: 29 mmol/L (ref 22–32)
Calcium: 9.3 mg/dL (ref 8.9–10.3)
Chloride: 101 mmol/L (ref 98–111)
Creatinine, Ser: 0.78 mg/dL (ref 0.44–1.00)
GFR, Estimated: >60 mL/min (ref >60)
Glucose, Bld: 122 mg/dL — ABNORMAL HIGH (ref 70–99)
Potassium: 4.2 mmol/L (ref 3.5–5.1)
Sodium: 137 mmol/L (ref 135–145)

## 2024-09-06 NOTE — Progress Notes (Signed)
 Physical Therapy Re-evaluation Patient Details Name: Crystal Little MRN: [id number] DOB: 11-23-1940 Today's Date: 09/06/2024   History of Present Illness 83 year old F presenting with increased right knee pain, redness and increased warmth to touch, and admitted with right knee cellulitis.ortho consulted- per Dr. 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 PT contacted PA/MD at time of PT Eval d/t concern for possible patella tendon rupture, MRI = patellar tendon is poorly visualized and is likely ruptured, recommend CT.   pt s/p R quad tendon repair on 12/325  PMH: R TKA on 08/13/2024, PVD, lymphedema, IBS, anxiety, IDA and class II obesity, patella tendon repair L knee 10/2023, back surgery    PT Comments  Pt agreeable to PT. States pain is controlled. Reviewed precautions/no ROM, pt verbalizes precautions. Pt requiring +2 assist for bed mobility, transfers. Able to amb ~ 8' with RW and +2 mod assist. Will continue to follow in acute setting. Patient will benefit from continued inpatient follow up therapy, <3 hours/day    If plan is discharge home, recommend the following: Two people to help with walking and/or transfers;Two people to help with bathing/dressing/bathroom;Help with stairs or ramp for entrance;Assist for transportation   Can travel by private vehicle     No  Equipment Recommendations  None recommended by PT    Recommendations for Other Services       Precautions / Restrictions Precautions Precautions: Knee;Fall Recall of Precautions/Restrictions: Intact Precaution/Restrictions Comments: KI, in  place-tightened by PT  prior to activity. reviewed no ROM R knee Knee Immobilizer - Right: On at all times Restrictions Weight Bearing Restrictions Per Provider Order: No RLE Weight Bearing Per Provider Order: Weight bearing as tolerated Other Position/Activity Restrictions: no ROM  R knee     Mobility  Bed Mobility Overal bed mobility: Needs Assistance Bed Mobility: Supine to Sit     Supine to sit: Mod assist, +2 for physical assistance, +2 for safety/equipment, HOB elevated     General bed mobility comments: assist with RLE and trunk to upright. cues for sequence and self assist    Transfers Overall transfer level: Needs assistance Equipment used: Rolling walker (2 wheels) Transfers: Sit to/from Stand Sit to Stand: Max assist, +2 physical assistance, +2 safety/equipment, From elevated surface   Step pivot transfers: Mod assist, +2 physical assistance, +2 safety/equipment       General transfer comment: cues for hand placement and RLE position. assist to rise, stabilize wtih posterior LOB on initial stand. once in standing cues needed for trunk/hip extension, anterior wt shift and use of UEs    Ambulation/Gait Ambulation/Gait assistance: Min assist, Mod assist, +2 physical assistance, +2 safety/equipment Gait Distance (Feet): 5 Feet Assistive device: Rolling walker (2 wheels) Gait Pattern/deviations: Step-to pattern, Decreased step length - right, Decreased step length - left       General Gait Details: multi-modal cues for sequence, RW position, trunk extension   Stairs             Wheelchair Mobility     Tilt Bed    Modified Rankin (Stroke Patients Only)       Balance Overall balance assessment: Needs assistance Sitting-balance support:  Feet supported, No upper extremity supported Sitting balance-Leahy Scale: Good     Standing balance support: Reliant on assistive device for balance, During functional activity, Bilateral upper extremity  supported Standing balance-Leahy Scale: Poor Standing balance comment: reliant on UEs and external assist                            Communication Communication Communication: No apparent difficulties Factors Affecting Communication: Hearing impaired  Cognition Arousal: Alert Behavior During Therapy: WFL for tasks assessed/performed   PT - Cognitive impairments: No apparent impairments                         Following commands: Intact      Cueing Cueing Techniques: Verbal cues, Gestural cues, Tactile cues  Exercises      General Comments        Pertinent Vitals/Pain Pain Assessment Pain Assessment: 0-10 Pain Score: 2  Pain Location: right knee Pain Descriptors / Indicators: Sore Pain Intervention(s): Limited activity within patient's tolerance, Monitored during session, Repositioned    Home Living                          Prior Function            PT Goals (current goals can now be found in the care plan section) Acute Rehab PT Goals Patient Stated Goal: be able to do more, walk bettter PT Goal Formulation: With patient Time For Goal Achievement: 09/18/24 Potential to Achieve Goals: Good Progress towards PT goals: Progressing toward goals    Frequency    Min 4X/week      PT Plan      Co-evaluation              AM-PAC PT 6 Clicks Mobility   Outcome Measure  Help needed turning from your back to your side while in a flat bed without using bedrails?: A Lot Help needed moving from lying on your back to sitting on the side of a flat bed without using bedrails?: A Lot Help needed moving to and from a bed to a chair (including a wheelchair)?: Total Help needed standing up from a chair using your arms (e.g., wheelchair or bedside chair)?: Total Help needed to walk in hospital room?: Total Help needed climbing 3-5 steps with a railing? : Total 6 Click Score: 8    End of Session Equipment Utilized During Treatment:  Right knee immobilizer;Gait belt Activity Tolerance: Patient tolerated treatment well Patient left: in chair;with call bell/phone within reach;with chair alarm set   PT Visit Diagnosis: Other abnormalities of gait and mobility (R26.89);Pain Pain - Right/Left: Right Pain - part of body: Knee     Time: 8975-8955 PT Time Calculation (min) (ACUTE ONLY): 20 min  Charges:      PT General Charges $$ ACUTE PT VISIT: 1 Visit                     Jaquane Boughner, PT  Acute Rehab Dept [facility]) [phone]  09/06/2024    [facility] 09/06/2024, 10:59 AM

## 2024-09-06 NOTE — TOC Progression Note (Addendum)
 Transition of Care [facility]) - Progression Note    Patient Details  Name: Crystal Little MRN: [id number] Date of Birth: 10-Sep-1941  Transition of Care [facility]) CM/SW Contact  Alfonse JONELLE Rex, RN Phone Number: 09/06/2024, 11:17 AM  Clinical Narrative:  PT eval complete, recommendation for short term rehab/SNF, patient agreeable to [facility]. Brittany, admit coordinator w/[facility] notified, confirmed patient is in her copay bed days, $203.00/day. NCM met with patient at bedside to review, patient agreeable to copay.   SNF auth initiated via [facility], Auth ID [id number], await determination.     Expected Discharge Plan: Skilled Nursing Facility Barriers to Discharge: Continued Medical Work up               Expected Discharge Plan and Services       Living arrangements for the past 2 months: Single Family Home                                       Social Drivers of Health (SDOH) Interventions SDOH Screenings   Food Insecurity: No Food Insecurity (09/03/2024)  Housing: Low Risk  (09/03/2024)  Transportation Needs: No Transportation Needs (09/03/2024)  Utilities: Not At Risk (09/03/2024)  Alcohol Screen: Low Risk  (06/13/2023)  Depression (PHQ2-9): Low Risk  (06/20/2024)  Financial Resource Strain: Low Risk  (06/13/2023)  Physical Activity: Insufficiently Active (06/13/2023)  Social Connections: Moderately Integrated (09/03/2024)  Stress: No Stress Concern Present (06/13/2023)  Tobacco Use: Low Risk  (09/05/2024)  Health Literacy: Adequate Health Literacy (06/13/2023)    Readmission Risk Interventions    09/05/2024   10:33 AM  Readmission Risk Prevention Plan  Transportation Screening Complete  PCP or Specialist Appt within 5-7 Days Complete  Home Care Screening Complete  Medication Review (RN CM) Complete

## 2024-09-06 NOTE — Progress Notes (Signed)
   Subjective: 1 Day Post-Op Procedure(s) (LRB): REPAIR, TENDON, PATELLAR (Right) Patient reports pain as mild  Patient seen in rounds by Dr. Melodi. Patient still groggy this morning, no events overnight.  We will begin therapy today.   Objective: Vital signs in last 24 hours: Temp:  [97.5 F (36.4 C)-98.2 F (36.8 C)] 98.2 F (36.8 C) (12/04 0556) Pulse Rate:  [71-78] 72 (12/04 0556) Resp:  [10-20] 18 (12/04 0556) BP: (114-139)/(48-75) 121/53 (12/04 0556) SpO2:  [94 %-100 %] 100 % (12/04 0556) Weight:  [86.2 kg] 86.2 kg (12/03 1545)  Intake/Output from previous day:  Intake/Output Summary (Last 24 hours) at 09/06/2024 0811 Last data filed at 09/06/2024 0556 Gross per 24 hour  Intake 1631.71 ml  Output 1850 ml  Net -218.29 ml     Intake/Output this shift: No intake/output data recorded.  Labs: Recent Labs    09/03/24 1700 09/04/24 0450 09/05/24 0420 09/06/24 0435  HGB 10.5* 9.6* 9.0* 9.9*   Recent Labs    09/05/24 0420 09/06/24 0435  WBC 7.8 5.7  RBC 3.04* 3.40*  HCT 28.8* 31.4*  PLT 348 352   Recent Labs    09/06/24 0435 09/06/24 0436  NA 137 136  K 4.2 4.2  CL 101 101  CO2 29 29  BUN 14 14  CREATININE 0.78 0.75  GLUCOSE 122* 122*  CALCIUM 9.3 9.3   No results for input(s): LABPT, INR in the last 72 hours.  Exam: General - Patient is Alert and Oriented Extremity - Neurologically intact Neurovascular intact Sensation intact distally Dorsiflexion/Plantar flexion intact Dressing - dressing C/D/I Motor Function - intact, moving foot and toes well on exam.   Past Medical History:  Diagnosis Date   Anemia    Anxiety    Diverticulosis    Frequent UTI    sees Dr. Nicholaus   GERD (gastroesophageal reflux disease)    History of hiatal hernia    IBS (irritable bowel syndrome)    Leg edema    Osteoarthritis    Peripheral vascular disease    Status post dilation of esophageal narrowing    Varicose veins of legs    sees [facility]     Assessment/Plan: 1 Day Post-Op Procedure(s) (LRB): REPAIR, TENDON, PATELLAR (Right) Principal Problem:   Patellar tendon rupture Active Problems:   Cellulitis of right knee   AKI (acute kidney injury)   Hypokalemia   Generalized weakness   Obesity, Class II, BMI 35-39.9  Estimated body mass index is 33.66 kg/m as calculated from the following:   Height as of this encounter: 5' 3 (1.6 m).   Weight as of this encounter: 86.2 kg. Up with therapy  DVT Prophylaxis - Aspirin  Weight bearing as tolerated. Begin  therapy.  No ROM to the right knee. Maintain knee immobilizer at all times.  Plan is to go to SNF after hospital stay.  Roxie Mess, PA-C Orthopedic Surgery [phone] 09/06/2024, 8:11 AM

## 2024-09-06 NOTE — Plan of Care (Signed)
   Problem: Clinical Measurements: Goal: Will remain free from infection Outcome: Progressing   Problem: Clinical Measurements: Goal: Respiratory complications will improve Outcome: Progressing

## 2024-09-06 NOTE — Progress Notes (Signed)
 PROGRESS NOTE  Crystal Little FMW:[id number] DOB: 13-Feb-1941   PCP: Johnny Garnette LABOR, MD  Patient is from: Home.  Lives alone.  Has rolling walker  DOA: 09/03/2024 LOS: 3  Chief complaints Chief Complaint  Patient presents with   Weakness     Brief Narrative / Interim history: 83 year old F with PMH of recent right TKA on 08/13/2024, PVD, lymphedema, IBS, anxiety, IDA and class II obesity presenting with increased right knee pain, redness and increased warmth to touch, and admitted with right knee cellulitis.   In ED, stable vitals.  BBC 9.8.  X-ray of right knee showed TKA in anatomical position with soft tissue swelling around the knee and osseous fragments anterior to the distal femur on the lateral view with a slightly high riding patella.  Patient was started on IV fluid and IV vancomycin.  Orthopedic surgery consulted and she was admitted for further care.  She was started on Zyvox.  MRI of right knee raised concern for ruptured patellar tendon and a small to moderate effusion.  She underwent patella tendon rupture repair on 12/3.  Subjective: Seen and examined earlier this morning.  No major events overnight or this morning.  No complaints.  Denies pain.   Assessment and plan: Cellulitis of the right knee: Presents with redness, swelling and pain.  Cultures NGTD.  Improving. 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  Right knee x-ray as above. Right patella tendon rupture-noted on MRI. -S/p patellar tendon rupture repair on 12/3. -Continue Zyvox to complete 1 week course -Maintain knee immobilizer and no ROM to the right knee per orthopedic surgery. -Pain control and VTE prophylaxis per orthopedic surgery. -PT/OT after surgery.   AKI: b/l Cr ~0.7-0.8.  AKI likely due to dehydration and torsemide .  Also has urinary tension.  Renal ultrasound without hydronephrosis or obstructive etiology.  AKI resolved. Recent Labs    10/20/23 0345 06/20/24 1406 07/31/24 1445  08/14/24 1051 09/03/24 1700 09/04/24 0450 09/05/24 0420 09/06/24 0435 09/06/24 0436  BUN 17 15 17 16  29* 28* 29* 14 14  CREATININE 0.80 0.69 0.76 0.84 1.28* 1.34* 1.16* 0.78 0.75  -Continue Foley catheter -Avoid nephrotoxic meds. -Continue holding torsemide   Anemia of renal disease: Stable. Recent Labs    10/20/23 0345 10/21/23 0356 06/20/24 1406 07/31/24 1445 08/14/24 1051 08/15/24 0338 09/03/24 1700 09/04/24 0450 09/05/24 0420 09/06/24 0435  HGB 10.4* 9.6* 12.2 12.1 11.1* 9.9* 10.5* 9.6* 9.0* 9.9*  - Continue monitoring  Acute urinary retention: Recurrent urinary retention with urine output of 900 cc on 12/2.  - Continue Foley catheter at least 4 weeks - Continue Urecholine. - May need outpatient follow-up with urology  Lymphedema?  Stable.  Reports she uses compression socks and lymphedema pump at home - Apply TED hose.   Hypokalemia -Monitor replenish K and Mg as appropriate  GERD - Continue PPI   Anxiety: Stable - Continue as needed Xanax    Generalized weakness - PT/OT eval  Pyuria/bacteriuria: Patient denies dysuria, frequency or urgency.   Class II obesity Body mass index is 33.66 kg/m.           DVT prophylaxis:  SCDs Start: 09/05/24 2118 Place TED hose Start: 09/05/24 2118 Place TED hose Start: 09/03/24 2019  Code Status: DNR Family Communication: None at bedside. Level of care: Med-Surg Status is: Inpatient Remains inpatient appropriate because: Right patellar tendon rupture and AKI   Final disposition: SNF.   55 minutes with more than 50% spent in reviewing records, counseling patient/family and coordinating  care.  Consultants:  Orthopedic surgery  Procedures: 12/3-right patellar tendon rupture repair by Dr. Melodi  Microbiology summarized: Blood cultures NGTD  Objective: Vitals:   09/06/24 0018 09/06/24 0156 09/06/24 0556 09/06/24 0955  BP: 127/66 (!) 126/59 (!) 121/53 (!) 114/49  Pulse: 72 71 72 90  Resp: 18 18  18 16   Temp: (!) 97.5 F (36.4 C) 98.1 F (36.7 C) 98.2 F (36.8 C) 97.8 F (36.6 C)  TempSrc: Oral Oral Oral Oral  SpO2: 98% 100% 100% 98%  Weight:      Height:        Examination:  GENERAL: No apparent distress.  Nontoxic. HEENT: MMM.  Vision and hearing grossly intact.  NECK: Supple.  No apparent JVD.  RESP:  No IWOB.  Fair aeration bilaterally. CVS:  RRR. Heart sounds normal.  ABD/GI/GU: BS+. Abd soft, NTND.  Foley catheter in place. MSK/EXT: Right knee immobilizer in place.  Neurovascular intact. NEURO: AA.  Oriented appropriately.  No apparent focal neuro deficit. PSYCH: Calm. Normal affect.   Sch Meds:  Scheduled Meds:  acetaminophen   1,000 mg Oral Q6H   aspirin   81 mg Oral Daily   bethanechol  25 mg Oral TID   Chlorhexidine  Gluconate Cloth  6 each Topical Daily   docusate sodium   100 mg Oral BID   feeding supplement  237 mL Oral TID BM   ferrous sulfate   325 mg Oral Q breakfast   pantoprazole   40 mg Oral Daily   senna-docusate  1 tablet Oral QHS   Continuous Infusions:  sodium chloride  Stopped (09/06/24 0705)   linezolid (ZYVOX) IV 600 mg (09/06/24 1001)   PRN Meds:.acetaminophen , ALPRAZolam , bisacodyl , bisacodyl , diphenhydrAMINE , HYDROmorphone  (DILAUDID ) injection, menthol  **OR** phenol, methocarbamol  **OR** methocarbamol  (ROBAXIN ) injection, metoCLOPramide  **OR** metoCLOPramide  (REGLAN ) injection, ondansetron  **OR** ondansetron  (ZOFRAN ) IV, oxyCODONE , polyethylene glycol, sodium phosphate , traMADol   Antimicrobials: Anti-infectives (From admission, onward)    Start     Dose/Rate Route Frequency Ordered Stop   09/06/24 0100  ceFAZolin  (ANCEF ) IVPB 2g/100 mL premix        2 g 200 mL/hr over 30 Minutes Intravenous Every 6 hours 09/05/24 2117 09/06/24 0643   09/05/24 1545  ceFAZolin  (ANCEF ) IVPB 2g/100 mL premix        2 g 200 mL/hr over 30 Minutes Intravenous On call to O.R. 09/05/24 1324 09/05/24 1845   09/04/24 2200  linezolid (ZYVOX) IVPB 600 mg         600 mg 300 mL/hr over 60 Minutes Intravenous Every 12 hours 09/03/24 2023 09/11/24 2159   09/03/24 1945  vancomycin (VANCOREADY) IVPB 1750 mg/350 mL        1,750 mg 175 mL/hr over 120 Minutes Intravenous  Once 09/03/24 1914 09/04/24 0803        I have personally reviewed the following labs and images: CBC: Recent Labs  Lab 09/03/24 1700 09/04/24 0450 09/05/24 0420 09/06/24 0435  WBC 9.8 9.3 7.8 5.7  NEUTROABS 6.8  --   --   --   HGB 10.5* 9.6* 9.0* 9.9*  HCT 33.1* 30.5* 28.8* 31.4*  MCV 91.9 92.4 94.7 92.4  PLT 484* 394 348 352   BMP &GFR Recent Labs  Lab 09/03/24 1700 09/04/24 0450 09/05/24 0420 09/06/24 0435 09/06/24 0436  NA 137 139 139 137 136  K 3.1* 3.6 4.0 4.2 4.2  CL 90* 96* 100 101 101  CO2 35* 33* 30 29 29   GLUCOSE 94 106* 102* 122* 122*  BUN 29* 28* 29* 14 14  CREATININE 1.28*  1.34* 1.16* 0.78 0.75  CALCIUM 10.2 9.2 9.0 9.3 9.3  MG  --  2.5* 2.3  --   --   PHOS  --  2.7 3.7  --  2.6   Estimated Creatinine Clearance: 54.5 mL/min (by C-G formula based on SCr of 0.75 mg/dL). Liver & Pancreas: Recent Labs  Lab 09/03/24 1700 09/05/24 0420 09/06/24 0436  AST 19  --   --   ALT 7  --   --   ALKPHOS 131*  --   --   BILITOT 0.9  --   --   PROT 8.2*  --   --   ALBUMIN 4.1 3.1* 3.3*   No results for input(s): LIPASE, AMYLASE in the last 168 hours. No results for input(s): AMMONIA in the last 168 hours. Diabetic: No results for input(s): HGBA1C in the last 72 hours. No results for input(s): GLUCAP in the last 168 hours. Cardiac Enzymes: No results for input(s): CKTOTAL, CKMB, CKMBINDEX, TROPONINI in the last 168 hours. No results for input(s): PROBNP in the last 8760 hours. Coagulation Profile: No results for input(s): INR, PROTIME in the last 168 hours. Thyroid  Function Tests: No results for input(s): TSH, T4TOTAL, FREET4, T3FREE, THYROIDAB in the last 72 hours. Lipid Profile: No results for input(s): CHOL,  HDL, LDLCALC, TRIG, CHOLHDL, LDLDIRECT in the last 72 hours. Anemia Panel: No results for input(s): VITAMINB12, FOLATE, FERRITIN, TIBC, IRON , RETICCTPCT in the last 72 hours. Urine analysis:    Component Value Date/Time   COLORURINE YELLOW 09/04/2024 0530   APPEARANCEUR HAZY (A) 09/04/2024 0530   LABSPEC 1.011 09/04/2024 0530   PHURINE 5.0 09/04/2024 0530   GLUCOSEU NEGATIVE 09/04/2024 0530   HGBUR NEGATIVE 09/04/2024 0530   HGBUR negative 02/06/2008 0947   BILIRUBINUR NEGATIVE 09/04/2024 0530   BILIRUBINUR n 10/25/2018 1453   KETONESUR NEGATIVE 09/04/2024 0530   PROTEINUR NEGATIVE 09/04/2024 0530   UROBILINOGEN 0.2 10/25/2018 1453   UROBILINOGEN 0.2 02/06/2008 0947   NITRITE NEGATIVE 09/04/2024 0530   LEUKOCYTESUR LARGE (A) 09/04/2024 0530   Sepsis Labs: Invalid input(s): PROCALCITONIN, LACTICIDVEN  Microbiology: Recent Results (from the past 240 hours)  Culture, blood (routine x 2)     Status: None (Preliminary result)   Collection Time: 09/03/24  5:00 PM   Specimen: BLOOD  Result Value Ref Range Status   Specimen Description   Final    BLOOD SITE NOT SPECIFIED Performed at [facility], [address] [place], [place] [address]    Special Requests   Final    BOTTLES DRAWN AEROBIC AND ANAEROBIC Blood Culture results may not be optimal due to an inadequate volume of blood received in culture bottles Performed at [facility], [address], [place], [place] [address]    Culture   Final    NO GROWTH 3 DAYS Performed at [facility], [address], [place], [place] [address]    Report Status PENDING  Incomplete  Culture, blood (routine x 2)     Status: None (Preliminary result)   Collection Time: 09/03/24  9:15 PM   Specimen: BLOOD LEFT ARM  Result Value Ref Range Status   Specimen Description   Final    BLOOD LEFT ARM Performed at [facility], [address]  [place], [place] [address]    Special Requests   Final    BOTTLES DRAWN AEROBIC ONLY Blood Culture results may not be optimal due to an inadequate volume of blood received in culture bottles  Performed at [facility], [address], [place], [place] [address]    Culture   Final    NO GROWTH 2 DAYS Performed at [facility], [address] [place], [place] [address]    Report Status PENDING  Incomplete    Radiology Studies: No results found.     Jaxxen Voong T. [facility]  If 7PM-7AM, please contact night-coverage [email or web address] 09/06/2024, 11:02 AM

## 2024-09-06 NOTE — Progress Notes (Signed)
 Physical Therapy Treatment Patient Details Name: Crystal Little MRN: [id number] DOB: 1941-08-29 Today's Date: 09/06/2024   History of Present Illness 83 year old F presenting with increased right knee pain, redness and increased warmth to touch, and admitted with right knee cellulitis.ortho consulted- per Dr. 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 PT contacted PA/MD at time of PT Eval d/t concern for possible patella tendon rupture, MRI = patellar tendon is poorly visualized and is likely ruptured, recommend CT.   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 Pt reports R knee pain with activity, no pain at rest. Returned to bed EOS. Advised nursing staff to utilize bed pan for toileting needs for pt and staff safety (pt has been unable to void today per RN).  D/c plan remains appropriate at this time. Continue PT POC.   If plan is discharge home, recommend the following: Two people to help with walking and/or transfers;Two people to help with bathing/dressing/bathroom;Help with stairs or ramp for entrance;Assist for transportation   Can travel by private vehicle     No  Equipment Recommendations  None recommended by PT    Recommendations for Other Services       Precautions / Restrictions Precautions Precautions: Knee;Fall Recall of Precautions/Restrictions:  Intact Precaution/Restrictions Comments: KI, in place-tightened by PT  prior to activity. reviewed no ROM R knee Knee Immobilizer - Right: On at all times Restrictions Weight Bearing Restrictions Per Provider Order: No RLE Weight Bearing Per Provider Order: Weight bearing as tolerated Other Position/Activity Restrictions: no ROM  R knee     Mobility  Bed Mobility Overal bed mobility: Needs Assistance Bed Mobility: Sit to Supine     Supine to sit: Mod assist, +2 for physical assistance, +2 for safety/equipment, HOB elevated Sit to supine: Min assist   General bed mobility comments: assist with RLE. cues for sequence and self assist    Transfers Overall transfer level: Needs assistance Equipment used: Rolling walker (2 wheels) Transfers: Sit to/from Stand Sit to Stand: Max assist, +2 physical assistance, +2 safety/equipment, Mod assist   Step pivot transfers: Mod assist, +2 physical assistance, +2 safety/equipment, Min assist       General transfer comment: cues for hand placement and RLE  position, knee flexion on L for repeated STS transitions. assist to rise, stabilize.  initial  posterior drift in standing,  able to correct with cues for trunk/hip extension and LE positioning.    Ambulation/Gait Ambulation/Gait assistance: Min assist, Mod assist, +2 physical assistance, +2 safety/equipment Gait Distance (Feet): 5 Feet (x2) Assistive device: Rolling walker (2 wheels) Gait Pattern/deviations: Step-to pattern, Decreased step length - right, Decreased step length - left, Decreased stance time - right       General Gait Details: multi-modal cues for sequence, RW position, trunk extension. pt able to take steps recliner >BSC, BSC to bed   Stairs  Wheelchair Mobility     Tilt Bed    Modified Rankin (Stroke Patients Only)       Balance Overall balance assessment: Needs assistance Sitting-balance support: Feet supported, No upper extremity  supported Sitting balance-Leahy Scale: Good     Standing balance support: Reliant on assistive device for balance, During functional activity, Bilateral upper extremity supported Standing balance-Leahy Scale: Poor Standing balance comment: reliant on UEs and external assist                            Communication Communication Communication: No apparent difficulties Factors Affecting Communication: Hearing impaired  Cognition Arousal: Alert Behavior During Therapy: WFL for tasks assessed/performed   PT - Cognitive impairments: No apparent impairments                         Following commands: Intact      Cueing Cueing Techniques: Verbal cues, Gestural cues, Tactile cues  Exercises Total Joint Exercises Ankle Circles/Pumps: AROM, Both, 5 reps, Limitations Ankle Circles/Pumps Limitations: deferred additonal reps d/t pt c/o R anterior ankle pain    General Comments        Pertinent Vitals/Pain Pain Assessment Pain Assessment: 0-10 Pain Score: 3  Pain Location: right knee with activity Pain Descriptors / Indicators: Sore Pain Intervention(s): Limited activity within patient's tolerance, Monitored during session, Repositioned    Home Living                          Prior Function            PT Goals (current goals can now be found in the care plan section) Acute Rehab PT Goals Patient Stated Goal: be able to do more, walk bettter PT Goal Formulation: With patient Time For Goal Achievement: 09/18/24 Potential to Achieve Goals: Good Progress towards PT goals: Progressing toward goals    Frequency    Min 4X/week      PT Plan      Co-evaluation              AM-PAC PT 6 Clicks Mobility   Outcome Measure  Help needed turning from your back to your side while in a flat bed without using bedrails?: A Lot Help needed moving from lying on your back to sitting on the side of a flat bed without using bedrails?: A Lot Help  needed moving to and from a bed to a chair (including a wheelchair)?: Total Help needed standing up from a chair using your arms (e.g., wheelchair or bedside chair)?: Total Help needed to walk in hospital room?: Total Help needed climbing 3-5 steps with a railing? : Total 6 Click Score: 8    End of Session Equipment Utilized During Treatment: Right knee immobilizer;Gait belt Activity Tolerance: Patient tolerated treatment well Patient left: in bed;with call bell/phone within reach;with bed alarm set (son in hallway outside room) Nurse Communication: Mobility status (use bed pan for now for pt and staff safety) PT Visit Diagnosis: Other abnormalities of gait and mobility (R26.89);Pain Pain - Right/Left: Right Pain - part of body: Knee     Time: 8552-8478 PT Time Calculation (min) (ACUTE ONLY): 34 min  Charges:    $Gait Training: 8-22 mins $Therapeutic Activity: 8-22 mins PT General Charges $$ ACUTE PT VISIT: 1 Visit  Rexene, PT  Acute Rehab Dept [facility]) [phone]  09/06/2024    TRUDY REXENE 09/06/2024, 3:28 PM

## 2024-09-07 ENCOUNTER — Other Ambulatory Visit (HOSPITAL_COMMUNITY): Payer: Self-pay

## 2024-09-07 DIAGNOSIS — S86811A Strain of other muscle(s) and tendon(s) at lower leg level, right leg, initial encounter: Secondary | ICD-10-CM | POA: Diagnosis not present

## 2024-09-07 DIAGNOSIS — R531 Weakness: Secondary | ICD-10-CM | POA: Diagnosis not present

## 2024-09-07 DIAGNOSIS — E876 Hypokalemia: Secondary | ICD-10-CM | POA: Diagnosis not present

## 2024-09-07 DIAGNOSIS — N179 Acute kidney failure, unspecified: Secondary | ICD-10-CM | POA: Diagnosis not present

## 2024-09-07 LAB — CBC
HCT: 29.2 % — ABNORMAL LOW (ref 36.0–46.0)
Hemoglobin: 9.2 g/dL — ABNORMAL LOW (ref 12.0–15.0)
MCH: 29.4 pg (ref 26.0–34.0)
MCHC: 31.5 g/dL (ref 30.0–36.0)
MCV: 93.3 fL (ref 80.0–100.0)
Platelets: 356 K/uL (ref 150–400)
RBC: 3.13 MIL/uL — ABNORMAL LOW (ref 3.87–5.11)
RDW: 14.6 % (ref 11.5–15.5)
WBC: 12 K/uL — ABNORMAL HIGH (ref 4.0–10.5)
nRBC: 0 % (ref 0.0–0.2)

## 2024-09-07 MED ORDER — ONDANSETRON HCL 4 MG PO TABS: 4.0000 mg | ORAL_TABLET | Freq: Four times a day (QID) | ORAL | 0 refills | Status: DC | PRN

## 2024-09-07 MED ORDER — TRAMADOL HCL 50 MG PO TABS
50.0000 mg | ORAL_TABLET | Freq: Four times a day (QID) | ORAL | 0 refills | Status: DC | PRN
  Filled 2024-09-07: qty 40, 5d supply, fill #0

## 2024-09-07 MED ORDER — TRAMADOL HCL 50 MG PO TABS: 50.0000 mg | ORAL_TABLET | Freq: Four times a day (QID) | ORAL | 0 refills | Status: AC | PRN

## 2024-09-07 MED ORDER — TRAMADOL HCL 50 MG PO TABS: 50.0000 mg | ORAL_TABLET | Freq: Four times a day (QID) | ORAL | 0 refills | Status: DC | PRN

## 2024-09-07 MED ORDER — OXYCODONE HCL 5 MG PO TABS
5.0000 mg | ORAL_TABLET | Freq: Four times a day (QID) | ORAL | 0 refills | Status: DC | PRN
  Filled 2024-09-07: qty 28, 7d supply, fill #0

## 2024-09-07 MED ORDER — METHOCARBAMOL 500 MG PO TABS
500.0000 mg | ORAL_TABLET | Freq: Four times a day (QID) | ORAL | 0 refills | Status: AC | PRN
  Filled 2024-09-07: qty 40, 10d supply, fill #0

## 2024-09-07 MED ORDER — OXYCODONE HCL 5 MG PO TABS: 5.0000 mg | ORAL_TABLET | Freq: Four times a day (QID) | ORAL | 0 refills | Status: DC | PRN

## 2024-09-07 MED ORDER — ONDANSETRON HCL 4 MG PO TABS
4.0000 mg | ORAL_TABLET | Freq: Four times a day (QID) | ORAL | 0 refills | Status: AC | PRN
  Filled 2024-09-07: qty 20, 5d supply, fill #0

## 2024-09-07 MED ORDER — ASPIRIN 81 MG PO CHEW: 81.0000 mg | CHEWABLE_TABLET | Freq: Every day | ORAL | 0 refills | Status: DC

## 2024-09-07 MED ORDER — POLYETHYLENE GLYCOL 3350 17 G PO PACK
17.0000 g | PACK | Freq: Two times a day (BID) | ORAL | Status: DC
  Administered 2024-09-07: 17 g via ORAL
  Filled 2024-09-07 (×2): qty 1

## 2024-09-07 MED ORDER — BISACODYL 5 MG PO TBEC
5.0000 mg | DELAYED_RELEASE_TABLET | Freq: Every day | ORAL | 0 refills | Status: AC | PRN
  Filled 2024-09-07: qty 30, 30d supply, fill #0

## 2024-09-07 MED ORDER — SORBITOL 70 % SOLN
30.0000 mL | Freq: Once | Status: AC
  Administered 2024-09-07: 30 mL via ORAL
  Filled 2024-09-07: qty 30

## 2024-09-07 MED ORDER — METHOCARBAMOL 500 MG PO TABS: 500.0000 mg | ORAL_TABLET | Freq: Four times a day (QID) | ORAL | 0 refills | Status: DC | PRN

## 2024-09-07 MED ORDER — ASPIRIN 81 MG PO CHEW
81.0000 mg | CHEWABLE_TABLET | Freq: Every day | ORAL | 0 refills | Status: AC
  Filled 2024-09-07: qty 21, 21d supply, fill #0

## 2024-09-07 MED ORDER — OXYCODONE HCL 5 MG PO TABS: 5.0000 mg | ORAL_TABLET | Freq: Four times a day (QID) | ORAL | 0 refills | Status: AC | PRN

## 2024-09-07 MED ORDER — BISACODYL 5 MG PO TBEC: 5.0000 mg | DELAYED_RELEASE_TABLET | Freq: Every day | ORAL | 0 refills | Status: DC | PRN

## 2024-09-07 NOTE — TOC Progression Note (Addendum)
 Transition of Care [facility]) - Progression Note    Patient Details  Name: Crystal Little MRN: [id number] Date of Birth: [birth year]  [facility] CM/SW Contact  Alfonse JONELLE Rex, RN Phone Number: 09/07/2024, 12:43 PM  Clinical Narrative:   SNF auth approved - Plan Auth ID [id number], Auth ID [id number], days approved 09/06/2024-09/10/2024, team notified.  Per Brittany, admit coordinator w/[facility], will have bed available for admission tomorrow, team notified.      Expected Discharge Plan: Skilled Nursing Facility Barriers to Discharge: Continued Medical Work up               Expected Discharge Plan and Services       Living arrangements for the past 2 months: Single Family Home                                       Social Drivers of Health (SDOH) Interventions SDOH Screenings   Food Insecurity: No Food Insecurity (09/03/2024)  Housing: Low Risk  (09/03/2024)  Transportation Needs: No Transportation Needs (09/03/2024)  Utilities: Not At Risk (09/03/2024)  Alcohol Screen: Low Risk  (06/13/2023)  Depression (PHQ2-9): Low Risk  (06/20/2024)  Financial Resource Strain: Low Risk  (06/13/2023)  Physical Activity: Insufficiently Active (06/13/2023)  Social Connections: Moderately Integrated (09/03/2024)  Stress: No Stress Concern Present (06/13/2023)  Tobacco Use: Low Risk  (09/05/2024)  Health Literacy: Adequate Health Literacy (06/13/2023)    Readmission Risk Interventions    09/05/2024   10:33 AM  Readmission Risk Prevention Plan  Transportation Screening Complete  PCP or Specialist Appt within 5-7 Days Complete  Home Care Screening Complete  Medication Review (RN CM) Complete

## 2024-09-07 NOTE — Plan of Care (Signed)
  Problem: Education: Goal: Knowledge of General Education information will improve Description: Including pain rating scale, medication(s)/side effects and non-pharmacologic comfort measures Outcome: Progressing   Problem: Health Behavior/Discharge Planning: Goal: Ability to manage health-related needs will improve Outcome: Progressing   Problem: Clinical Measurements: Goal: Ability to maintain clinical measurements within normal limits will improve Outcome: Progressing Goal: Will remain free from infection Outcome: Progressing Goal: Diagnostic test results will improve Outcome: Progressing Goal: Respiratory complications will improve Outcome: Progressing Goal: Cardiovascular complication will be avoided Outcome: Progressing   Problem: Activity: Goal: Risk for activity intolerance will decrease Outcome: Progressing   Problem: Nutrition: Goal: Adequate nutrition will be maintained Outcome: Progressing   Problem: Coping: Goal: Level of anxiety will decrease Outcome: Progressing   Problem: Elimination: Goal: Will not experience complications related to bowel motility Outcome: Progressing Goal: Will not experience complications related to urinary retention Outcome: Progressing   Problem: Pain Managment: Goal: General experience of comfort will improve and/or be controlled Outcome: Progressing   Problem: Safety: Goal: Ability to remain free from injury will improve Outcome: Progressing   Problem: Skin Integrity: Goal: Risk for impaired skin integrity will decrease Outcome: Progressing   Problem: Clinical Measurements: Goal: Ability to avoid or minimize complications of infection will improve Outcome: Progressing   Problem: Skin Integrity: Goal: Skin integrity will improve Outcome: Progressing   Problem: Education: Goal: Knowledge of the prescribed therapeutic regimen will improve Outcome: Progressing Goal: Individualized Educational Video(s) Outcome:  Progressing   Problem: Activity: Goal: Ability to avoid complications of mobility impairment will improve Outcome: Progressing Goal: Range of joint motion will improve Outcome: Progressing   Problem: Clinical Measurements: Goal: Postoperative complications will be avoided or minimized Outcome: Progressing   Problem: Pain Management: Goal: Pain level will decrease with appropriate interventions Outcome: Progressing   Problem: Skin Integrity: Goal: Will show signs of wound healing Outcome: Progressing

## 2024-09-07 NOTE — Progress Notes (Signed)
 Physical Therapy Treatment Patient Details Name: Crystal Little MRN: [id number] DOB: 01-Mar-1941 Today's Date: 09/07/2024   History of Present Illness 83 year old F presenting with increased right knee pain, redness and increased warmth to touch, and admitted with right knee cellulitis.ortho consulted- per Dr. 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 PT contacted PA/MD at time of PT Eval d/t concern for possible patella tendon rupture, MRI = patellar tendon is poorly visualized and is likely ruptured, recommend CT.   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  Pt currently with functional limitations due to the deficits listed below (see PT Problem List). Pt seated in recliner when therapist arrived. Pt agreeable to therapy intervention. Pt reports need to void bowels. Set up for SPT recliner to [facility] with max A x 2 pt exhibits difficulty with pivoting especially with retrograde stepping pattern requiring BSC to be placed behind pt prior to safely completing pivoting task. Pt required max A x 2 for sit to stand  from [facility] cues for push to stand from arm rest, PT managing R LE in KI, gait progression with RW, R KI donned, min A x 1 and CGA x 1 with recliner close, A for RW management, cues for proper sequencing with advancing R LE and cues for posture with slow cadence and recliner close. Pt left seated in recliner, all needs in place.  Patient will benefit from  continued inpatient follow up therapy, <3 hours/day.  Pt will benefit from acute skilled PT to increase their independence and safety with mobility to allow discharge.      If plan is discharge home, recommend the following: Two people to help with walking and/or transfers;Two people to help with bathing/dressing/bathroom;Help with stairs or ramp for entrance;Assist for transportation   Can travel by private vehicle     No  Equipment Recommendations  None recommended by PT    Recommendations for Other Services       Precautions / Restrictions Precautions Precautions: Knee;Fall Recall of Precautions/Restrictions: Intact Precaution/Restrictions Comments: R KI donned Required Braces or Orthoses: Knee Immobilizer - Right Knee Immobilizer - Right: On at all times Restrictions Weight Bearing Restrictions Per Provider Order: Yes RLE Weight Bearing Per Provider Order: Weight bearing as tolerated Other Position/Activity Restrictions: no ROM  R knee     Mobility  Bed Mobility               General bed mobility comments: pt seated in recliner with R KI donned when PT arrived    Transfers Overall transfer level: Needs assistance Equipment used: Rolling walker (2 wheels) Transfers: Sit to/from Stand, Bed to chair/wheelchair/BSC Sit to Stand: Max assist, +2 physical assistance, +2 safety/equipment   Step pivot transfers: +2 physical assistance, +2 safety/equipment, Max assist       General transfer comment: increased time, cues for RW management, advacing LEs required to pivot from recliner to [facility], pt demonstrating difficulty with retrograde stepping pattern and required  BSC to be placed behind pt vs completing pivoting task, PT managed R LE with KI donned lifting and transitioning to floor    Ambulation/Gait Ambulation/Gait assistance: +2 physical assistance, +2 safety/equipment, Contact guard assist, Min assist Gait Distance (Feet): 20 Feet Assistive device: Rolling walker  (2 wheels) Gait Pattern/deviations: Step-to pattern, Decreased stance time - right, Decreased step length - left Gait velocity: decreased     General Gait Details: constant cues and A for RW management to maintain proper body position inside RW, cues for advacing R LE then L, cues for encouragement and posture R KI donned   Stairs             Wheelchair Mobility     Tilt Bed    Modified Rankin (Stroke Patients Only)       Balance Overall balance assessment: Needs assistance Sitting-balance support: Feet supported, No upper extremity supported Sitting balance-Leahy Scale: Good     Standing balance support: Reliant on assistive device for balance, During functional activity, Bilateral upper extremity supported Standing balance-Leahy Scale: Poor Standing balance comment: B UE support at RW with CGA to min A                            Communication Communication Communication: No apparent difficulties Factors Affecting Communication: Hearing impaired  Cognition Arousal: Alert Behavior During Therapy: WFL for tasks assessed/performed   PT - Cognitive impairments: No apparent impairments                         Following commands: Intact      Cueing Cueing Techniques: Verbal cues, Gestural cues, Tactile cues  Exercises      General Comments        Pertinent Vitals/Pain Pain Assessment Pain Assessment: Faces Faces Pain Scale: Hurts little more Pain Location: right knee with activity Pain Descriptors / Indicators: Sore, Guarding, Discomfort Pain Intervention(s): Limited activity within patient's tolerance, Monitored during session, Repositioned    Home Living                          Prior Function            PT Goals (current goals can now be found in the care plan section) Acute Rehab PT Goals Patient Stated Goal: be able to do more, walk bettter PT Goal Formulation: With patient Time For Goal Achievement:  09/18/24 Potential to Achieve Goals: Good Progress towards PT goals: Progressing toward goals    Frequency    Min 4X/week      PT Plan      Co-evaluation              AM-PAC PT 6 Clicks Mobility   Outcome Measure  Help needed turning from your back to your side while in a flat bed without using bedrails?: A Lot Help needed moving from lying on your back to sitting on the side of a flat bed without using bedrails?: A Lot Help needed moving to and from a bed to a chair (including a wheelchair)?: A Lot Help needed standing up from a chair using your arms (e.g., wheelchair or bedside chair)?: A Lot Help needed to walk in hospital room?: A Lot Help needed climbing 3-5 steps with a railing? : Total 6 Click Score: 11    End of Session Equipment Utilized During Treatment: Right knee immobilizer;Gait belt Activity Tolerance: Patient tolerated  treatment well Patient left: in chair;with call bell/phone within reach (son in hallway outside room) Nurse Communication: Mobility status PT Visit Diagnosis: Other abnormalities of gait and mobility (R26.89);Pain Pain - Right/Left: Right Pain - part of body: Knee     Time: 1137-1200 PT Time Calculation (min) (ACUTE ONLY): 23 min  Charges:    $Gait Training: 8-22 mins $Therapeutic Activity: 8-22 mins PT General Charges $$ ACUTE PT VISIT: 1 Visit                     [place], PT Acute Rehab    [place] VEAR Drone 09/07/2024, 1:41 PM

## 2024-09-07 NOTE — Progress Notes (Signed)
   Subjective: 2 Days Post-Op Procedure(s) (LRB): REPAIR, TENDON, PATELLAR (Right) Patient reports pain as mild.   Patient seen in rounds by Dr. Melodi. Patient is doing well this morning, less groggy compared to yesterday. No issues overnight.  Plan is to go Skilled nursing facility after hospital stay.  Objective: Vital signs in last 24 hours: Temp:  [97.6 F (36.4 C)-97.9 F (36.6 C)] 97.6 F (36.4 C) (12/05 0546) Pulse Rate:  [71-90] 71 (12/05 0546) Resp:  [16] 16 (12/05 0546) BP: (114-129)/(49-57) 116/57 (12/05 0546) SpO2:  [96 %-100 %] 98 % (12/05 0546) Weight:  [88.8 kg] 88.8 kg (12/05 0500)  Intake/Output from previous day:  Intake/Output Summary (Last 24 hours) at 09/07/2024 0730 Last data filed at 09/07/2024 0600 Gross per 24 hour  Intake 1303.68 ml  Output 500 ml  Net 803.68 ml    Intake/Output this shift: No intake/output data recorded.  Labs: Recent Labs    09/05/24 0420 09/06/24 0435 09/07/24 0508  HGB 9.0* 9.9* 9.2*   Recent Labs    09/06/24 0435 09/07/24 0508  WBC 5.7 12.0*  RBC 3.40* 3.13*  HCT 31.4* 29.2*  PLT 352 356   Recent Labs    09/06/24 0435 09/06/24 0436  NA 137 136  K 4.2 4.2  CL 101 101  CO2 29 29  BUN 14 14  CREATININE 0.78 0.75  GLUCOSE 122* 122*  CALCIUM 9.3 9.3   No results for input(s): LABPT, INR in the last 72 hours.  Exam: General - Patient is Alert and Oriented Extremity - Neurologically intact Neurovascular intact Sensation intact distally Dorsiflexion/Plantar flexion intact Dressing/Incision - clean, dry, no drainage Motor Function - intact, moving foot and toes well on exam.   Past Medical History:  Diagnosis Date   Anemia    Anxiety    Diverticulosis    Frequent UTI    sees Dr. Nicholaus   GERD (gastroesophageal reflux disease)    History of hiatal hernia    IBS (irritable bowel syndrome)    Leg edema    Osteoarthritis    Peripheral vascular disease    Status post dilation of esophageal  narrowing    Varicose veins of legs    sees [facility]     Assessment/Plan: 2 Days Post-Op Procedure(s) (LRB): REPAIR, TENDON, PATELLAR (Right) Principal Problem:   Patellar tendon rupture Active Problems:   Cellulitis of right knee   AKI (acute kidney injury)   Hypokalemia   Generalized weakness   Obesity, Class II, BMI 35-39.9  Estimated body mass index is 34.68 kg/m as calculated from the following:   Height as of this encounter: 5' 3 (1.6 m).   Weight as of this encounter: 88.8 kg. Up with therapy  DVT Prophylaxis - Aspirin  Weight-bearing as tolerated  No ROM to the right LE Maintain knee immobilizer at all times Awaiting SNF placement  Roxie Mess, PA-C Orthopedic Surgery [phone] 09/07/2024, 7:30 AM

## 2024-09-07 NOTE — Progress Notes (Addendum)
 [facility] PROGRESS NOTE    Progress Note  Crystal Little  FMW:[id number] DOB: 08/30/1941 DOA: 09/03/2024 PCP: Johnny Garnette LABOR, MD     Brief Narrative:   Crystal Little is an 83 y.o. 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  Small to moderate knee effusion.  Started empirically on IV antibiotics, orthopedic surgery was consulted recommended right patellar tendon repair done on 09/05/2024   Assessment/Plan:   Patellar tendon rupture Imaging showed right patella tendon rupture. S/p repair on 09/05/2024.  Orthopedic surgery recommended to continue Zyvox  for 1 week. Maintaining knee immobilizer and no range of motion of the right knee per orthopedic surgery. Ortho recommended aspirin  for DVT prophylaxis. Weightbearing as tolerated no range of motion of the right knee, knee immobilizer in place. PT OT recommended skilled nursing facility. MiraLAX  twice a day has not had a bowel movement.  Acute kidney injury: With a baseline creatinine of 0.8. Renal ultrasound showed no obstruction. Started on IV fluids now creatinine has returned to baseline.  Anemia of chronic renal disease: Appears to be at baseline.  Acute urinary retention: Foley has been discontinued urinating without any difficulties.  Lymphedema: Continue TED hose.  Hypokalemia: Should keep potassium greater than 4 magnesium greater than 2.  GERD: Continue PPI.  Anxiety: Xanax  as needed.  Generalized weakness: Will need skilled nursing facility.  Morbid obesity class II: Noted.   DVT prophylaxis: Lovenox  Family Communication: None Status is: Inpatient Remains inpatient appropriate because: Patellar tendon rupture, awaiting  skilled nursing facility placement.    Code Status:     Code Status Orders  (From admission, onward)           Start     Ordered   09/03/24 2003  Do not attempt resuscitation (DNR)- Limited -Do Not Intubate (DNI)  Continuous       Question Answer Comment  If pulseless and not breathing No CPR or chest compressions.   In Pre-Arrest Conditions (Patient Is Breathing and Has A Pulse) Do not intubate. Provide all appropriate non-invasive medical interventions. Avoid ICU transfer unless indicated or required.   Consent: Discussion documented in EHR or advanced directives reviewed      09/03/24 2002           Code Status History     Date Active Date Inactive Code Status Order ID Comments User Context   08/13/2024 1608 08/16/2024 1952 Full Code [id number]  Reena Roxie CROME, [place] Inpatient   10/19/2023 2027 10/21/2023 2233 Full Code [id number]  Kristian Stabs, [place] Inpatient   04/18/2023 1119 04/20/2023 2327 Full Code [id number]  Reena Roxie CROME, [place] Inpatient      Advance Directive Documentation    Flowsheet Row Most Recent Value  Type of Advance Directive Healthcare Power of Attorney, Living will  Pre-existing out of facility DNR order (yellow form or pink MOST form) --  MOST Form in Place? --      IV Access:   Peripheral IV   Procedures and diagnostic studies:   No results found.   Medical Consultants:   None.   Subjective:    Crystal Little she relates her pain is controlled has not had a bowel movement.  Objective:    Vitals:   09/06/24 1758 09/06/24 2021 09/07/24  0500 09/07/24 0546  BP: (!) 129/55 (!) 125/51  (!) 116/57  Pulse: 83 88  71  Resp: 16 16  16   Temp: 97.9 F (36.6 C) 97.8 F (36.6 C)  97.6 F (36.4 C)  TempSrc: Oral Oral  Oral  SpO2: 98% 96%  98%  Weight:   88.8 kg   Height:       SpO2: 98 % O2 Flow Rate (L/min): 2 L/min   Intake/Output Summary (Last 24 hours) at 09/07/2024 0746 Last data filed at 09/07/2024 0600 Gross  per 24 hour  Intake 1303.68 ml  Output 500 ml  Net 803.68 ml   Filed Weights   09/05/24 0500 09/05/24 1545 09/07/24 0500  Weight: 90 kg 86.2 kg 88.8 kg    Exam: General exam: In no acute distress. Respiratory system: Good air movement and clear to auscultation. Cardiovascular system: S1 & S2 heard, RRR. No JVD. Gastrointestinal system: Abdomen is nondistended, soft and nontender.  Central nervous system: Alert and oriented. No focal neurological deficits. Extremities: No pedal edema. Skin: No rashes, lesions or ulcers Psychiatry: Judgement and insight appear normal. Mood & affect appropriate.    Data Reviewed:    Labs: Basic Metabolic Panel: Recent Labs  Lab 09/03/24 1700 09/04/24 0450 09/05/24 0420 09/06/24 0435 09/06/24 0436  NA 137 139 139 137 136  K 3.1* 3.6 4.0 4.2 4.2  CL 90* 96* 100 101 101  CO2 35* 33* 30 29 29   GLUCOSE 94 106* 102* 122* 122*  BUN 29* 28* 29* 14 14  CREATININE 1.28* 1.34* 1.16* 0.78 0.75  CALCIUM 10.2 9.2 9.0 9.3 9.3  MG  --  2.5* 2.3  --   --   PHOS  --  2.7 3.7  --  2.6   GFR Estimated Creatinine Clearance: 55.4 mL/min (by C-G formula based on SCr of 0.75 mg/dL). Liver Function Tests: Recent Labs  Lab 09/03/24 1700 09/05/24 0420 09/06/24 0436  AST 19  --   --   ALT 7  --   --   ALKPHOS 131*  --   --   BILITOT 0.9  --   --   PROT 8.2*  --   --   ALBUMIN 4.1 3.1* 3.3*   No results for input(s): LIPASE, AMYLASE in the last 168 hours. No results for input(s): AMMONIA in the last 168 hours. Coagulation profile No results for input(s): INR, PROTIME in the last 168 hours. COVID-19 Labs  No results for input(s): DDIMER, FERRITIN, LDH, CRP in the last 72 hours.  No results found for: SARSCOV2NAA  CBC: Recent Labs  Lab 09/03/24 1700 09/04/24 0450 09/05/24 0420 09/06/24 0435 09/07/24 0508  WBC 9.8 9.3 7.8 5.7 12.0*  NEUTROABS 6.8  --   --   --   --   HGB 10.5* 9.6* 9.0* 9.9* 9.2*  HCT 33.1* 30.5* 28.8*  31.4* 29.2*  MCV 91.9 92.4 94.7 92.4 93.3  PLT 484* 394 348 352 356   Cardiac Enzymes: No results for input(s): CKTOTAL, CKMB, CKMBINDEX, TROPONINI in the last 168 hours. BNP (last 3 results) No results for input(s): PROBNP in the last 8760 hours. CBG: No results for input(s): GLUCAP in the last 168 hours. D-Dimer: No results for input(s): DDIMER in the last 72 hours. Hgb A1c: No results for input(s): HGBA1C in the last 72 hours. Lipid Profile: No results for input(s): CHOL, HDL, LDLCALC, TRIG, CHOLHDL, LDLDIRECT in the last 72 hours. Thyroid  function studies: No results for input(s): TSH, T4TOTAL, T3FREE, THYROIDAB in  the last 72 hours.  Invalid input(s): FREET3 Anemia work up: No results for input(s): VITAMINB12, FOLATE, FERRITIN, TIBC, IRON , RETICCTPCT in the last 72 hours. Sepsis Labs: Recent Labs  Lab 09/03/24 1721 09/04/24 0450 09/05/24 0420 09/06/24 0435 09/07/24 0508  WBC  --  9.3 7.8 5.7 12.0*  LATICACIDVEN 1.4  --   --   --   --    Microbiology Recent Results (from the past 240 hours)  Culture, blood (routine x 2)     Status: None (Preliminary result)   Collection Time: 09/03/24  5:00 PM   Specimen: BLOOD  Result Value Ref Range Status   Specimen Description   Final    BLOOD SITE NOT SPECIFIED Performed at [facility], [address] [place], [place] [address]    Special Requests   Final    BOTTLES DRAWN AEROBIC AND ANAEROBIC Blood Culture results may not be optimal due to an inadequate volume of blood received in culture bottles Performed at [facility], [address] [place], [place] [address]    Culture   Final    NO GROWTH 3 DAYS Performed at [facility], [address] [place], [place] [address]    Report Status PENDING  Incomplete  Culture, blood (routine x 2)     Status: None (Preliminary result)   Collection Time: 09/03/24  9:15 PM   Specimen:  BLOOD LEFT ARM  Result Value Ref Range Status   Specimen Description   Final    BLOOD LEFT ARM Performed at [facility], [address] [place], [place] [address]    Special Requests   Final    BOTTLES DRAWN AEROBIC ONLY Blood Culture results may not be optimal due to an inadequate volume of blood received in culture bottles Performed at [facility], [address], [place], [place] [address]    Culture   Final    NO GROWTH 2 DAYS Performed at [facility], [address], [place], [place] [address]    Report Status PENDING  Incomplete     Medications:    aspirin   81 mg Oral Daily   bethanechol   25 mg Oral TID   Chlorhexidine  Gluconate Cloth  6 each Topical Daily   docusate sodium   100 mg Oral BID   feeding supplement  237 mL Oral TID BM   ferrous sulfate   325 mg Oral Q breakfast   pantoprazole   40 mg Oral Daily   senna-docusate  1 tablet Oral QHS   Continuous Infusions:  sodium chloride  Stopped (09/06/24 0705)   linezolid  (ZYVOX ) IV 600 mg (09/06/24 2225)      LOS: 4 days   Erle Odell Castor  [facility]  09/07/2024, 7:46 AM

## 2024-09-07 NOTE — Plan of Care (Signed)
   Problem: Education: Goal: Knowledge of General Education information will improve Description Including pain rating scale, medication(s)/side effects and non-pharmacologic comfort measures Outcome: Progressing   Problem: Health Behavior/Discharge Planning: Goal: Ability to manage health-related needs will improve Outcome: Progressing

## 2024-09-07 NOTE — Progress Notes (Signed)
 Mobility Specialist Progress Note:   09/07/24 1423  Mobility  Activity Pivoted/transferred to/from [facility] : [facility]  Level of Assistance Moderate assist, patient does 50-74%  Assistive Device Front wheel walker  Distance Ambulated (ft) 2 ft  RLE Weight Bearing Per Provider Order WBAT  Activity Response Tolerated fair  Mobility visit 1 Mobility  Mobility Specialist Start Time (ACUTE ONLY) 1403  Mobility Specialist Stop Time (ACUTE ONLY) 1418  Mobility Specialist Time Calculation (min) (ACUTE ONLY) 15 min   RN requested assistance for xfer to [facility]. Pt returned to recliner with all needs met. Left in room with RN.  [employer] - Mobility Specialist

## 2024-09-08 DIAGNOSIS — S86811A Strain of other muscle(s) and tendon(s) at lower leg level, right leg, initial encounter: Secondary | ICD-10-CM | POA: Diagnosis not present

## 2024-09-08 DIAGNOSIS — R531 Weakness: Secondary | ICD-10-CM | POA: Diagnosis not present

## 2024-09-08 DIAGNOSIS — E876 Hypokalemia: Secondary | ICD-10-CM | POA: Diagnosis not present

## 2024-09-08 DIAGNOSIS — N179 Acute kidney failure, unspecified: Secondary | ICD-10-CM | POA: Diagnosis not present

## 2024-09-08 LAB — CULTURE, BLOOD (ROUTINE X 2): Culture: NO GROWTH

## 2024-09-08 MED ORDER — POLYETHYLENE GLYCOL 3350 17 G PO PACK: 17.0000 g | PACK | Freq: Every day | ORAL | Status: AC

## 2024-09-08 MED ORDER — ALPRAZOLAM 0.25 MG PO TABS: 0.2500 mg | ORAL_TABLET | Freq: Every evening | ORAL | 0 refills | Status: AC | PRN

## 2024-09-08 MED ORDER — LINEZOLID 600 MG PO TABS: 600.0000 mg | ORAL_TABLET | Freq: Two times a day (BID) | ORAL | Status: AC

## 2024-09-08 MED ORDER — LINEZOLID 600 MG PO TABS
600.0000 mg | ORAL_TABLET | Freq: Two times a day (BID) | ORAL | Status: DC
  Filled 2024-09-08: qty 1

## 2024-09-08 NOTE — Progress Notes (Signed)
 Report called to [facility] and given to Kaylee, CHARITY FUNDRAISER.

## 2024-09-08 NOTE — Plan of Care (Signed)
  Problem: Health Behavior/Discharge Planning: Goal: Ability to manage health-related needs will improve Outcome: Progressing   Problem: Clinical Measurements: Goal: Ability to maintain clinical measurements within normal limits will improve Outcome: Progressing   Problem: Nutrition: Goal: Adequate nutrition will be maintained Outcome: Progressing   Problem: Coping: Goal: Level of anxiety will decrease Outcome: Progressing   Problem: Pain Managment: Goal: General experience of comfort will improve and/or be controlled Outcome: Progressing   Problem: Safety: Goal: Ability to remain free from injury will improve Outcome: Progressing

## 2024-09-08 NOTE — Progress Notes (Signed)
 Assessment unchanged. [facility] here to transport pt to [facility]. Packet given to paramedic to present at facility.

## 2024-09-08 NOTE — Progress Notes (Signed)
   Subjective:  83 y.o. female now POD 2 from right patellar tendon repair. Patient reports that she had an uneventful night. She has been wearing the knee immobilizer and is awaiting SNF placement  Otherwise, no acute events overnight. No numbness or tingling to the extremity.  Objective:   VITALS:   Vitals:   09/07/24 1319 09/07/24 2121 09/08/24 0500 09/08/24 0534  BP: (!) 122/55 (!) 121/52  (!) 146/62  Pulse: 82 88  72  Resp: 18 16  16   Temp: 98 F (36.7 C) 98.3 F (36.8 C)  98.4 F (36.9 C)  TempSrc:  Oral  Oral  SpO2: 100% 98%  98%  Weight:   88.5 kg   Height:        Physical Exam: General: Alert, no acute distress Cardiovascular: No pedal edema Respiratory: No cyanosis, no use of accessory musculature GI: No organomegaly, abdomen is soft and non-tender Skin: No lesions in the area of chief complaint Neurologic: Sensation intact distally Psychiatric: Patient is competent for consent with normal mood and affect Lymphatic: No axillary or cervical lymphadenopathy  MUSCULOSKELETAL: Right lower extremity - Dressing c/d/i - TTP peri incisionally, knee immobilizer in place - Able to axtively dorsiflex/plantarflex the ankle - Sensation intact to light touch sural, saphenous, tibial, deep and superficial peroneal nerve distributions - 2+ DP pulse  Lab Results  Component Value Date   WBC 12.0 (H) 09/07/2024   HGB 9.2 (L) 09/07/2024   HCT 29.2 (L) 09/07/2024   MCV 93.3 09/07/2024   PLT 356 09/07/2024   BMET    Component Value Date/Time   NA 136 09/06/2024 0436   K 4.2 09/06/2024 0436   CL 101 09/06/2024 0436   CO2 29 09/06/2024 0436   GLUCOSE 122 (H) 09/06/2024 0436   BUN 14 09/06/2024 0436   CREATININE 0.75 09/06/2024 0436   CALCIUM 9.3 09/06/2024 0436   GFRNONAA >60 09/06/2024 0436     Assessment/Plan: 83 y.o. female  now 3 Days Post-Op from Principal Problem:   Patellar tendon rupture Active Problems:   Cellulitis of right knee   AKI (acute kidney  injury)   Hypokalemia   Generalized weakness   Obesity, Class II, BMI 35-39.9 .  Patient is doing well POD 2 right patellar tendon repair. She will require continued knee immobilizer use at all times and is awaiting final SNF placement  Weight bearing: as tolerated in knee immobilizer - No ROM left knee Pain control: oxy PT/OT DVT ppx: aspirin  Bowel regimen: senna docusate Dispo: pending SNF placement   [facility] ORN [facility] 09/08/2024, 8:41 AM

## 2024-09-08 NOTE — TOC Transition Note (Signed)
 Transition of Care [facility]) - Discharge Note   Patient Details  Name: Crystal Little MRN: [id number] Date of Birth: [birth year]  [facility] CM/SW Contact:  Sonda Manuella Quill, RN Phone Number: 09/08/2024, 12:38 PM   Clinical Narrative:    D/C orders received; Brittany at [facility] notified; she gave RM # [phone], call report # [phone]; transport by PTAR; pt notified; she agreed to d/c plan; pt also said she will notify her family; [facility] called for transport at 1238; spoke w/ operator # [phone]' D/C summary and SNF transfer report sent via SNF hub; no IP CM needs.   Final next level of care: Skilled Nursing Facility Barriers to Discharge: No Barriers Identified   Patient Goals and CMS Choice Patient states their goals for this hospitalization and ongoing recovery are:: anticipate short term rehab at [facility]          Discharge Placement              Patient chooses bed at: [facility] Patient to be transferred to facility by: PTAR Name of family member notified: pt said she will notify family Patient and family notified of of transfer: 09/08/24  Discharge Plan and Services Additional resources added to the After Visit Summary for                  DME Arranged: N/A DME Agency: NA       HH Arranged: NA HH Agency: NA        Social Drivers of Health (SDOH) Interventions SDOH Screenings   Food Insecurity: No Food Insecurity (09/03/2024)  Housing: Low Risk  (09/03/2024)  Transportation Needs: No Transportation Needs (09/03/2024)  Utilities: Not At Risk (09/03/2024)  Alcohol Screen: Low Risk  (06/13/2023)  Depression (PHQ2-9): Low Risk  (06/20/2024)  Financial Resource Strain: Low Risk  (06/13/2023)  Physical Activity: Insufficiently Active (06/13/2023)  Social Connections: Moderately Integrated (09/03/2024)  Stress: No Stress Concern Present (06/13/2023)  Tobacco Use: Low Risk  (09/05/2024)  Health Literacy: Adequate Health Literacy (06/13/2023)      Readmission Risk Interventions    09/05/2024   10:33 AM  Readmission Risk Prevention Plan  Transportation Screening Complete  PCP or Specialist Appt within 5-7 Days Complete  Home Care Screening Complete  Medication Review (RN CM) Complete

## 2024-09-08 NOTE — Plan of Care (Signed)
  Problem: Education: Goal: Knowledge of General Education information will improve Description: Including pain rating scale, medication(s)/side effects and non-pharmacologic comfort measures Outcome: Progressing   Problem: Health Behavior/Discharge Planning: Goal: Ability to manage health-related needs will improve Outcome: Progressing   Problem: Clinical Measurements: Goal: Ability to maintain clinical measurements within normal limits will improve Outcome: Progressing Goal: Will remain free from infection Outcome: Progressing Goal: Diagnostic test results will improve Outcome: Progressing Goal: Respiratory complications will improve Outcome: Progressing Goal: Cardiovascular complication will be avoided Outcome: Progressing   Problem: Activity: Goal: Risk for activity intolerance will decrease Outcome: Progressing   Problem: Nutrition: Goal: Adequate nutrition will be maintained Outcome: Progressing   Problem: Coping: Goal: Level of anxiety will decrease Outcome: Progressing   Problem: Elimination: Goal: Will not experience complications related to bowel motility Outcome: Progressing Goal: Will not experience complications related to urinary retention Outcome: Progressing   Problem: Pain Managment: Goal: General experience of comfort will improve and/or be controlled Outcome: Progressing   Problem: Safety: Goal: Ability to remain free from injury will improve Outcome: Progressing   Problem: Skin Integrity: Goal: Risk for impaired skin integrity will decrease Outcome: Progressing   Problem: Clinical Measurements: Goal: Ability to avoid or minimize complications of infection will improve Outcome: Progressing   Problem: Skin Integrity: Goal: Skin integrity will improve Outcome: Progressing   Problem: Education: Goal: Knowledge of the prescribed therapeutic regimen will improve Outcome: Progressing Goal: Individualized Educational Video(s) Outcome:  Progressing   Problem: Activity: Goal: Ability to avoid complications of mobility impairment will improve Outcome: Progressing Goal: Range of joint motion will improve Outcome: Progressing   Problem: Clinical Measurements: Goal: Postoperative complications will be avoided or minimized Outcome: Progressing   Problem: Pain Management: Goal: Pain level will decrease with appropriate interventions Outcome: Progressing   Problem: Skin Integrity: Goal: Will show signs of wound healing Outcome: Progressing

## 2024-09-08 NOTE — Discharge Summary (Signed)
 Physician Discharge Summary  Crystal Little FMW:[id number] DOB: 1941-07-26 DOA: 09/03/2024  PCP: Johnny Garnette LABOR, MD  Admit date: 09/03/2024 Discharge date: 09/08/2024  Admitted From: Home Disposition:  Home  Recommendations for Outpatient Follow-up:  Follow up with PCP in 1-2 weeks Please obtain BMP/CBC in one week   Home Health:No Equipment/Devices:None  Discharge Condition:Stable CODE STATUS:Full Diet recommendation: Heart Healthy   Brief/Interim Summary: 83 y.o. 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  Small to moderate knee effusion.  Started empirically on IV antibiotics, orthopedic surgery was consulted recommended right patellar tendon repair done on 09/05/2024   Discharge Diagnoses:  Principal Problem:   Patellar tendon rupture Active Problems:   Cellulitis of right knee   AKI (acute kidney injury)   Hypokalemia   Generalized weakness   Obesity, Class II, BMI 35-39.9 Patellar tendon rupture: Orthopedic surgery was consulted status post repair on 09/05/2024. Orthopedic surgery recommended Zyvox  for a week. PT evaluated the patient she will go to nursing facility. Continue aspirin  for DVT prophylaxis. Weightbearing as tolerated. She will need a knee immobilizer when she cannot bend the knee. Continue MiraLAX  as needed for constipation.  Acute kidney injury: Likely.  No azotemia resolved with IV fluids ultrasound showed no hydronephrosis.  Anemia of chronic renal disease: [place] remained at baseline.  Acute urinary retention: Now resolved.  Lymphedema: Continue TED hose P  Hypokalemia: [place] potassium greater than 4 magnesium greater than 2.  GERD: Continue  PPI.  Anxiety: Continue Xanax  as needed.  Morbid obesity: Noted.   Discharge Instructions  Discharge Instructions     Diet - low sodium heart healthy   Complete by: As directed    Increase activity slowly   Complete by: As directed    No wound care   Complete by: As directed       Allergies as of 09/08/2024       Reactions   Amoxicillin  Nausea And Vomiting   Unknown reaction         Medication List     STOP taking these medications    cyclobenzaprine  10 MG tablet Commonly known as: FLEXERIL    Hydrocortisone 2 % Crea       TAKE these medications    ALPRAZolam  0.25 MG tablet Commonly known as: XANAX  Take 1 tablet (0.25 mg total) by mouth at bedtime as needed for anxiety or sleep.   aspirin  81 MG chewable tablet Chew 1 tablet (81 mg total) by mouth daily for 21 days. What changed:  when to take this additional instructions   bisacodyl  10 MG suppository Commonly known as: DULCOLAX Place 1 suppository (10 mg total) rectally daily as needed for moderate constipation. What changed: Another medication with the same name was added. Make sure you understand how and when to take each.   bisacodyl  5 MG EC tablet Commonly known as: DULCOLAX Take 1 tablet (5 mg total) by mouth daily as needed for moderate constipation. What changed: You were already taking a medication with the same name, and this prescription was added. Make sure you understand how and when to take each.   cholecalciferol 25 MCG (1000 UNIT) tablet Commonly known as: VITAMIN D3 Take 1,000 Units by mouth daily.   fluticasone 50 MCG/ACT nasal spray Commonly known as: FLONASE  Place 1 spray into both nostrils daily as needed (nasal congestion.).   Iron  (Ferrous Sulfate ) 325 (65 Fe) MG Tabs Take 325 mg by mouth daily.   linezolid  600 MG tablet Commonly known as: ZYVOX  Take 1 tablet (600 mg total) by mouth every 12 (twelve) hours for 4 days.   methocarbamol  500 MG tablet Commonly known as:  ROBAXIN  Take 1 tablet (500 mg total) by mouth every 6 (six) hours as needed for muscle spasms. What changed: when to take this   omeprazole  40 MG capsule Commonly known as: PRILOSEC Take 1 capsule (40 mg total) by mouth daily.   ondansetron  4 MG tablet Commonly known as: ZOFRAN  Take 1 tablet (4 mg total) by mouth every 6 (six) hours as needed for nausea.   oxyCODONE  5 MG immediate release tablet Commonly known as: Oxy IR/ROXICODONE  Take 1 tablet (5 mg total) by mouth every 6 (six) hours as needed for severe pain (pain score 7-10). What changed: when to take this   polyethylene glycol 17 g packet Commonly known as: MIRALAX  / GLYCOLAX  Take 17 g by mouth daily as needed for mild constipation. What changed: Another medication with the same name was added. Make sure you understand how and when to take each.   polyethylene glycol 17 g packet Commonly known as: MIRALAX  / GLYCOLAX  Take 17 g by mouth daily. What changed: You were already taking a medication with the same name, and this prescription was added. Make sure you understand how and when to take each.   THERATEARS OP Place 1 drop into both eyes 3 (three) times daily as needed (dry/irritated eyes.).   torsemide  20 MG tablet Commonly known as: DEMADEX  Take 2 tablets (40 mg total) by mouth daily.   traMADol  50 MG tablet Commonly known as: ULTRAM  Take 1-2 tablets (50-100 mg total) by mouth every 6 (six) hours as needed for moderate pain (pain score 4-6).   VIACTIV CALCIUM PLUS D PO Take 1 tablet by mouth daily.        Follow-up Information     Melodi Lerner, MD. Schedule an appointment as soon as possible for a visit in 2 week(s).   Specialty: Orthopedic Surgery Contact information: [address] [place] [address] [phone]                Allergies  Allergen Reactions   Amoxicillin  Nausea And Vomiting    Unknown reaction     Consultations: Orthopedic  surgery.   Procedures/Studies: US  RENAL Result Date: 09/04/2024 CLINICAL DATA:  [id number] AKI (acute kidney injury) [id number] EXAM: RENAL / URINARY TRACT ULTRASOUND COMPLETE COMPARISON:  07/06/2004 FINDINGS: Right Kidney: Renal measurements: 10.9 x 4.7 x 5.1 cm = volume: 136 mL.Normal echogenicity. No mass. No hydronephrosis or nephrolithiasis. Left Kidney: Renal measurements: 9.9 x 5.1 x 4.9 cm = volume: 131 mL. Normal echogenicity. No mass. No hydronephrosis or nephrolithiasis. Bladder: Appears normal for degree of bladder distention. Other: None. IMPRESSION: No hydronephrosis or nephrolithiasis. Electronically Signed   By: Rogelia Myers M.D.   On: 09/04/2024 20:32   MR KNEE RIGHT WO CONTRAST Result Date: 09/04/2024 CLINICAL DATA:  Knee replacement, soft-tissue abnormality suspected Knee replacement surgery yesterday. Cellulitis. Evaluate patellar tendon. EXAM: MRI OF THE RIGHT KNEE WITHOUT CONTRAST TECHNIQUE: Multiplanar, multisequence MR imaging of the knee was performed. No intravenous contrast was administered. COMPARISON:  Radiographs 09/03/2024. No other comparison studies are available. Report from a remote MRI 12/17/2011 is correlated. FINDINGS: Bones/Joint/Cartilage There is significant susceptibility artifact related to the  total knee arthroplasty, limiting assessment of the adjacent osseous structures. The ossific density noted inferior to the patella on the radiographs is not well visualized and could reflect an avulsion fracture of the lower pole of the patella or the tibial tubercle. As correlated with recent radiographs, no other evidence of acute fracture, dislocation or hardware loosening. There is a small to moderate knee joint effusion. Ligaments Largely obscured by artifact. The collateral ligaments are grossly intact. Muscles and Tendons As noted on the radiographs, the patella is high-riding. The distal quadriceps tendon appears intact. The patellar tendon is poorly visualized and is  likely ruptured, although partially obscured by artifact from the knee arthroplasty. Generalized muscular atrophy about the knee. Soft tissue Anterior knee incision noted with edema and ill-defined fluid inferior to the patella and anterior to the distal femur and proximal tibia. IMPRESSION: 1. The patellar tendon is poorly visualized and is likely ruptured, although partially obscured by artifact from the knee arthroplasty. 2. The ossific density noted inferior to the patella on the radiographs is not well visualized and could reflect an avulsion fracture of the lower pole of the patella or the tibial tubercle. CT may be helpful for further evaluation and localization of the patellar tendon rupture. 3. Small to moderate knee joint effusion. 4. No other evidence of acute fracture, dislocation or hardware loosening. Electronically Signed   By: Elsie Perone M.D.   On: 09/04/2024 18:42   VAS US  LOWER EXTREMITY VENOUS (DVT) (ONLY [facility] & [facility]) Result Date: 09/03/2024  Lower Venous DVT Study Patient Name:  HADLYN AMERO  Date of Exam:   09/03/2024 Medical Rec #: [id number]          Accession #:    [id number] Date of Birth: [birth year]         Patient Gender: F Patient Age:   1 years Exam Location:  [facility] Procedure:      VAS US  LOWER EXTREMITY VENOUS (DVT) Referring Phys: CARON YOUNG --------------------------------------------------------------------------------  Indications: Swelling.  Risk Factors: Surgery. Limitations: Poor ultrasound/tissue interface. Comparison Study: No prior studies. Performing Technologist: Cordella Collet RVT  Examination Guidelines: A complete evaluation includes B-mode imaging, spectral Doppler, color Doppler, and power Doppler as needed of all accessible portions of each vessel. Bilateral testing is considered an integral part of a complete examination. Limited examinations for reoccurring indications may be performed as noted. The reflux portion of the exam is performed  with the patient in reverse Trendelenburg.  +---------+---------------+---------+-----------+----------+--------------+ RIGHT    CompressibilityPhasicitySpontaneityPropertiesThrombus Aging +---------+---------------+---------+-----------+----------+--------------+ CFV      Full           Yes      Yes                                 +---------+---------------+---------+-----------+----------+--------------+ SFJ      Full                                                        +---------+---------------+---------+-----------+----------+--------------+ FV Prox  Full                                                        +---------+---------------+---------+-----------+----------+--------------+  FV Mid   Full                                                        +---------+---------------+---------+-----------+----------+--------------+ FV Distal               Yes      Yes                                 +---------+---------------+---------+-----------+----------+--------------+ PFV      Full                                                        +---------+---------------+---------+-----------+----------+--------------+ POP      Full           Yes      Yes                                 +---------+---------------+---------+-----------+----------+--------------+ PTV      Full                                                        +---------+---------------+---------+-----------+----------+--------------+ PERO     Full                                                        +---------+---------------+---------+-----------+----------+--------------+   +----+---------------+---------+-----------+----------+--------------+ LEFTCompressibilityPhasicitySpontaneityPropertiesThrombus Aging +----+---------------+---------+-----------+----------+--------------+ CFV Full           Yes      Yes                                  +----+---------------+---------+-----------+----------+--------------+    Summary: RIGHT: - There is no evidence of deep vein thrombosis in the lower extremity. However, portions of this examination were limited- see technologist comments above.  - No cystic structure found in the popliteal fossa.  LEFT: - No evidence of common femoral vein obstruction.   *See table(s) above for measurements and observations. Electronically signed by Fonda Rim on 09/03/2024 at 7:02:53 PM.    Final    DG Knee Right Port Result Date: 09/03/2024 CLINICAL DATA:  Knee pain EXAM: PORTABLE RIGHT KNEE - 1-2 VIEW COMPARISON:  None Available. 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 There is soft tissue swelling surrounding the knee. There is no evidence for hardware loosening or acute fracture. Patella appears high riding. There is an osseous fragment on the lateral view seen anterior to the distal femur measuring 2.3 x 0.6 cm, indeterminate. IMPRESSION: 1. 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 2. Soft tissue swelling surrounding the knee. 3. Osseous fragment anterior to the distal femur on the lateral view, indeterminate. 4.  Patella appears high riding.  Please correlate clinically. Electronically Signed   By: Greig Pique M.D.   On: 09/03/2024 17:29   DG Chest Portable 1 View Result Date: 09/03/2024 CLINICAL DATA:  Postoperative infection EXAM: PORTABLE CHEST 1 VIEW COMPARISON:  None Available. FINDINGS: The heart size and mediastinal contours are within normal limits. Both lungs are clear. The visualized skeletal structures are unremarkable. IMPRESSION: No active disease. Electronically Signed   By: Greig Pique M.D.   On: 09/03/2024 17:19   (Echo, Carotid, EGD, Colonoscopy, ERCP)    Subjective: No complaints  Discharge Exam: Vitals:   09/07/24 2121 09/08/24 0534  BP: (!) 121/52 (!) 146/62  Pulse: 88 72  Resp: 16 16  Temp: 98.3 F (36.8 C) 98.4 F (36.9 C)  SpO2: 98% 98%    Vitals:   09/07/24 1319 09/07/24 2121 09/08/24 0500 09/08/24 0534  BP: (!) 122/55 (!) 121/52  (!) 146/62  Pulse: 82 88  72  Resp: 18 16  16   Temp: 98 F (36.7 C) 98.3 F (36.8 C)  98.4 F (36.9 C)  TempSrc:  Oral  Oral  SpO2: 100% 98%  98%  Weight:   88.5 kg   Height:        General: Pt is alert, awake, not in acute distress Cardiovascular: RRR, S1/S2 +, no rubs, no gallops Respiratory: CTA bilaterally, no wheezing, no rhonchi Abdominal: Soft, NT, ND, bowel sounds + Extremities: no edema, no cyanosis    The results of significant diagnostics from this hospitalization (including imaging, microbiology, ancillary and laboratory) are listed below for reference.     Microbiology: Recent Results (from the past 240 hours)  Culture, blood (routine x 2)     Status: None (Preliminary result)   Collection Time: 09/03/24  5:00 PM   Specimen: BLOOD  Result Value Ref Range Status   Specimen Description   Final    BLOOD SITE NOT SPECIFIED Performed at [facility], [address] [place], [place] [address]    Special Requests   Final    BOTTLES DRAWN AEROBIC AND ANAEROBIC Blood Culture results may not be optimal due to an inadequate volume of blood received in culture bottles Performed at [facility], [address], [place], [place] [address]    Culture   Final    NO GROWTH 4 DAYS Performed at [facility], [address], [place], [place] [address]    Report Status PENDING  Incomplete  Culture, blood (routine x 2)     Status: None (Preliminary result)   Collection Time: 09/03/24  9:15 PM   Specimen: BLOOD LEFT ARM  Result Value Ref Range Status   Specimen Description   Final    BLOOD LEFT ARM Performed at [facility], [address] [place], [place] [address]    Special Requests   Final    BOTTLES DRAWN AEROBIC ONLY Blood Culture results may not be optimal due to an inadequate volume of blood received in  culture bottles Performed at [facility], [address], [place], [place] [address]    Culture   Final    NO GROWTH 3 DAYS Performed at [facility], [address], [place], [place] [address]    Report Status PENDING  Incomplete     Labs: BNP (last 3 results) No results for input(s): BNP in the last 8760 hours. Basic Metabolic Panel: Recent Labs  Lab 09/03/24 1700 09/04/24 0450 09/05/24 0420 09/06/24  0435 09/06/24 0436  NA 137 139 139 137 136  K 3.1* 3.6 4.0 4.2 4.2  CL 90* 96* 100 101 101  CO2 35* 33* 30 29 29   GLUCOSE 94 106* 102* 122* 122*  BUN 29* 28* 29* 14 14  CREATININE 1.28* 1.34* 1.16* 0.78 0.75  CALCIUM 10.2 9.2 9.0 9.3 9.3  MG  --  2.5* 2.3  --   --   PHOS  --  2.7 3.7  --  2.6   Liver Function Tests: Recent Labs  Lab 09/03/24 1700 09/05/24 0420 09/06/24 0436  AST 19  --   --   ALT 7  --   --   ALKPHOS 131*  --   --   BILITOT 0.9  --   --   PROT 8.2*  --   --   ALBUMIN 4.1 3.1* 3.3*   No results for input(s): LIPASE, AMYLASE in the last 168 hours. No results for input(s): AMMONIA in the last 168 hours. CBC: Recent Labs  Lab 09/03/24 1700 09/04/24 0450 09/05/24 0420 09/06/24 0435 09/07/24 0508  WBC 9.8 9.3 7.8 5.7 12.0*  NEUTROABS 6.8  --   --   --   --   HGB 10.5* 9.6* 9.0* 9.9* 9.2*  HCT 33.1* 30.5* 28.8* 31.4* 29.2*  MCV 91.9 92.4 94.7 92.4 93.3  PLT 484* 394 348 352 356   Cardiac Enzymes: No results for input(s): CKTOTAL, CKMB, CKMBINDEX, TROPONINI in the last 168 hours. BNP: Invalid input(s): POCBNP CBG: No results for input(s): GLUCAP in the last 168 hours. D-Dimer No results for input(s): DDIMER in the last 72 hours. Hgb A1c No results for input(s): HGBA1C in the last 72 hours. Lipid Profile No results for input(s): CHOL, HDL, LDLCALC, TRIG, CHOLHDL, LDLDIRECT in the last 72 hours. Thyroid  function studies No results for input(s): TSH, T4TOTAL, T3FREE,  THYROIDAB in the last 72 hours.  Invalid input(s): FREET3 Anemia work up No results for input(s): VITAMINB12, FOLATE, FERRITIN, TIBC, IRON , RETICCTPCT in the last 72 hours. Urinalysis    Component Value Date/Time   COLORURINE YELLOW 09/04/2024 0530   APPEARANCEUR HAZY (A) 09/04/2024 0530   LABSPEC 1.011 09/04/2024 0530   PHURINE 5.0 09/04/2024 0530   GLUCOSEU NEGATIVE 09/04/2024 0530   HGBUR NEGATIVE 09/04/2024 0530   HGBUR negative 02/06/2008 0947   BILIRUBINUR NEGATIVE 09/04/2024 0530   BILIRUBINUR n 10/25/2018 1453   KETONESUR NEGATIVE 09/04/2024 0530   PROTEINUR NEGATIVE 09/04/2024 0530   UROBILINOGEN 0.2 10/25/2018 1453   UROBILINOGEN 0.2 02/06/2008 0947   NITRITE NEGATIVE 09/04/2024 0530   LEUKOCYTESUR LARGE (A) 09/04/2024 0530   Sepsis Labs Recent Labs  Lab 09/04/24 0450 09/05/24 0420 09/06/24 0435 09/07/24 0508  WBC 9.3 7.8 5.7 12.0*   Microbiology Recent Results (from the past 240 hours)  Culture, blood (routine x 2)     Status: None (Preliminary result)   Collection Time: 09/03/24  5:00 PM   Specimen: BLOOD  Result Value Ref Range Status   Specimen Description   Final    BLOOD SITE NOT SPECIFIED Performed at [facility], [address] [place], [place] [address]    Special Requests   Final    BOTTLES DRAWN AEROBIC AND ANAEROBIC Blood Culture results may not be optimal due to an inadequate volume of blood received in culture bottles Performed at [facility], [address] [place], [place] [address]    Culture   Final    NO GROWTH 4 DAYS Performed at  [facility], [address] [place], [place] [address]    Report Status PENDING  Incomplete  Culture, blood (routine x 2)     Status: None (Preliminary result)   Collection Time: 09/03/24  9:15 PM   Specimen: BLOOD LEFT ARM  Result Value Ref Range Status   Specimen Description   Final    BLOOD LEFT ARM Performed at [facility], [address], [place], [place] [address]    Special Requests   Final    BOTTLES DRAWN AEROBIC ONLY Blood Culture results may not be optimal due to an inadequate volume of blood received in culture bottles Performed at [facility], [address], [place], [place] [address]    Culture   Final    NO GROWTH 3 DAYS Performed at [facility], [address], [place], [place] [address]    Report Status PENDING  Incomplete     Time coordinating discharge: Over 35 minutes  SIGNED:   Erle Odell Castor, MD  [facility] 09/08/2024, 9:57 AM Pager   If 7PM-7AM, please contact night-coverage [email or web address] Password [email or web address]

## 2024-09-08 NOTE — Plan of Care (Signed)
  Problem: Education: Goal: Knowledge of General Education information will improve Description: Including pain rating scale, medication(s)/side effects and non-pharmacologic comfort measures Outcome: Adequate for Discharge   Problem: Health Behavior/Discharge Planning: Goal: Ability to manage health-related needs will improve 09/08/2024 1719 by Raynaldo Vina GAILS, RN Outcome: Adequate for Discharge 09/08/2024 1631 by Raynaldo Vina GAILS, RN Outcome: Progressing   Problem: Clinical Measurements: Goal: Ability to maintain clinical measurements within normal limits will improve 09/08/2024 1719 by Raynaldo Vina GAILS, RN Outcome: Adequate for Discharge 09/08/2024 1631 by Raynaldo Vina GAILS, RN Outcome: Progressing Goal: Will remain free from infection Outcome: Adequate for Discharge Goal: Diagnostic test results will improve Outcome: Adequate for Discharge Goal: Respiratory complications will improve Outcome: Adequate for Discharge Goal: Cardiovascular complication will be avoided Outcome: Adequate for Discharge   Problem: Activity: Goal: Risk for activity intolerance will decrease Outcome: Adequate for Discharge   Problem: Nutrition: Goal: Adequate nutrition will be maintained 09/08/2024 1719 by Raynaldo Vina GAILS, RN Outcome: Adequate for Discharge 09/08/2024 1631 by Raynaldo Vina GAILS, RN Outcome: Progressing   Problem: Coping: Goal: Level of anxiety will decrease 09/08/2024 1719 by Raynaldo Vina GAILS, RN Outcome: Adequate for Discharge 09/08/2024 1631 by Raynaldo Vina GAILS, RN Outcome: Progressing   Problem: Elimination: Goal: Will not experience complications related to bowel motility Outcome: Adequate for Discharge Goal: Will not experience complications related to urinary retention Outcome: Adequate for Discharge   Problem: Pain Managment: Goal: General experience of comfort will improve and/or be controlled 09/08/2024 1719 by Raynaldo Vina GAILS, RN Outcome: Adequate  for Discharge 09/08/2024 1631 by Raynaldo Vina GAILS, RN Outcome: Progressing   Problem: Safety: Goal: Ability to remain free from injury will improve 09/08/2024 1719 by Raynaldo Vina GAILS, RN Outcome: Adequate for Discharge 09/08/2024 1631 by Raynaldo Vina GAILS, RN Outcome: Progressing   Problem: Skin Integrity: Goal: Risk for impaired skin integrity will decrease Outcome: Adequate for Discharge   Problem: Clinical Measurements: Goal: Ability to avoid or minimize complications of infection will improve Outcome: Adequate for Discharge   Problem: Skin Integrity: Goal: Skin integrity will improve Outcome: Adequate for Discharge   Problem: Acute Rehab PT Goals(only PT should resolve) Goal: Pt Will Go Supine/Side To Sit Outcome: Adequate for Discharge Goal: Patient Will Transfer Sit To/From Stand Outcome: Adequate for Discharge Goal: Pt Will Transfer Bed To Chair/Chair To Bed Outcome: Adequate for Discharge   Problem: Education: Goal: Knowledge of the prescribed therapeutic regimen will improve Outcome: Adequate for Discharge Goal: Individualized Educational Video(s) Outcome: Adequate for Discharge   Problem: Activity: Goal: Ability to avoid complications of mobility impairment will improve Outcome: Adequate for Discharge Goal: Range of joint motion will improve Outcome: Adequate for Discharge   Problem: Clinical Measurements: Goal: Postoperative complications will be avoided or minimized Outcome: Adequate for Discharge   Problem: Pain Management: Goal: Pain level will decrease with appropriate interventions Outcome: Adequate for Discharge   Problem: Skin Integrity: Goal: Will show signs of wound healing Outcome: Adequate for Discharge

## 2024-09-08 NOTE — Evaluation (Addendum)
 Occupational Therapy Evaluation Patient Details Name: Crystal Little MRN: [id number] DOB: 1941-02-01 Today's Date: 09/08/2024   History of Present Illness   83 year old F presenting with increased right knee pain, redness and increased warmth to touch, and admitted with right knee cellulitis.ortho consulted- per Dr. 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 PT contacted PA/MD at time of PT Eval d/t concern for possible patella tendon rupture, MRI = patellar tendon is poorly visualized and is likely ruptured, recommend CT.   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  Reports PTA needing assist for mobility using RW and needing some assist for ADLs since dc from rehab.  Today, she requires min-mod assist for bed mobility, setup to total assist for ADLs and max assist for transfers.  Good understanding of R LE precautions (KI at all times, no bending knee and WBAT).  Based on performance today, believe patient will best benefit from continued OT services at inpatient setting with <3hrs/day to optimize independence, safety with ADLs and mobility.  Plans to dc today to rehab, will defer OT services acutely.      If plan is discharge home, recommend the following:   A lot of help with walking and/or transfers;A lot of help with bathing/dressing/bathroom;Assistance with cooking/housework;Assist for transportation;Help  with stairs or ramp for entrance     Functional Status Assessment   Patient has had a recent decline in their functional status and demonstrates the ability to make significant improvements in function in a reasonable and predictable amount of time.     Equipment Recommendations   None recommended by OT (defer)     Recommendations for Other Services         Precautions/Restrictions   Precautions Precautions: Knee;Fall Recall of Precautions/Restrictions: Intact Precaution/Restrictions Comments: R KI donned Required Braces or Orthoses: Knee Immobilizer - Right Knee Immobilizer - Right: On at all times Restrictions Weight Bearing Restrictions Per Provider Order: Yes RLE Weight Bearing Per Provider Order: Weight bearing as tolerated Other Position/Activity Restrictions: no ROM  R knee     Mobility Bed Mobility Overal bed mobility: Needs Assistance Bed Mobility: Supine to Sit, Sit to Supine     Supine to sit: Mod assist Sit to supine: Min assist   General bed mobility comments: mod assist for R LE and trunk to ascend to EOB, R LE support back to supine    Transfers Overall transfer level: Needs assistance Equipment used: Rolling walker (2 wheels) Transfers: Sit to/from Stand Sit to Stand: Max assist, From elevated surface           General transfer comment: max assist to power up and steady from EOB, cueing for hand placement and safety      Balance Overall balance assessment: Needs assistance Sitting-balance support: No upper extremity supported, Feet supported Sitting balance-Leahy Scale: Good     Standing balance support: Bilateral upper extremity supported, During functional activity, Reliant on assistive device  for balance Standing balance-Leahy Scale: Poor Standing balance comment: relies on BUE and external support                           ADL either performed or assessed with clinical judgement   ADL Overall ADL's : Needs  assistance/impaired     Grooming: Set up;Sitting           Upper Body Dressing : Set up;Sitting   Lower Body Dressing: Sit to/from stand;Total assistance     Toilet Transfer Details (indicate cue type and reason): deferred         Functional mobility during ADLs: Maximal assistance;Cueing for safety;Cueing for sequencing;Rolling walker (2 wheels)       Vision   Vision Assessment?: No apparent visual deficits     Perception         Praxis         Pertinent Vitals/Pain Pain Assessment Pain Assessment: Faces Faces Pain Scale: Hurts little more Pain Location: right knee with activity Pain Descriptors / Indicators: Sore, Guarding, Discomfort Pain Intervention(s): Limited activity within patient's tolerance, Monitored during session, Repositioned     Extremity/Trunk Assessment Upper Extremity Assessment Upper Extremity Assessment: Overall WFL for tasks assessed   Lower Extremity Assessment Lower Extremity Assessment: Defer to PT evaluation (R knee surgery in KI)   Cervical / Trunk Assessment Cervical / Trunk Assessment: Other exceptions Cervical / Trunk Exceptions: increased body habitus   Communication Communication Communication: No apparent difficulties   Cognition Arousal: Alert Behavior During Therapy: WFL for tasks assessed/performed Cognition: No apparent impairments                               Following commands: Intact       Cueing  General Comments   Cueing Techniques: Verbal cues  son at side and supportive   Exercises     Shoulder Instructions      Home Living Family/patient expects to be discharged to:: Skilled nursing facility                                        Prior Functioning/Environment Prior Level of Function : Independent/Modified Independent             Mobility Comments: using RW since d/c from rehab; has not ambulated since 08/31/24; prior to that was ambulating well with RW  per pt. ADLs Comments: caregiver assisting since d/c from rehab, pt has been able to use AE for LB dressing but still needing some assist    OT Problem List: Decreased strength;Decreased activity tolerance;Impaired balance (sitting and/or standing);Decreased knowledge of use of DME or AE;Decreased knowledge of precautions;Pain   OT Treatment/Interventions:        OT Goals(Current goals can be found in the care plan section)   Acute Rehab OT Goals Patient Stated Goal: rehab OT Goal Formulation: With patient   OT Frequency:       Co-evaluation              AM-PAC OT 6 Clicks Daily Activity     Outcome Measure Help from another person eating meals?: None Help from another person taking care of personal grooming?: A Little Help from another person toileting, which includes using toliet, bedpan, or urinal?: A Lot Help from another person bathing (including washing, rinsing, drying)?:  A Lot Help from another person to put on and taking off regular upper body clothing?: A Little Help from another person to put on and taking off regular lower body clothing?: A Lot 6 Click Score: 16   End of Session    Activity Tolerance:   Patient left:    OT Visit Diagnosis: Unsteadiness on feet (R26.81);Pain;Muscle weakness (generalized) (M62.81) Pain - Right/Left: Right Pain - part of body: Knee                Time: 8851-8796 OT Time Calculation (min): 15 min Charges:  OT General Charges $OT Visit: 1 Visit OT Evaluation $OT Eval Moderate Complexity: 1 Mod  Etta NOVAK, OT Acute Rehabilitation Services Office [phone] Secure Chat Preferred    Etta GORMAN Hope 09/08/2024, 12:10 PM

## 2024-09-09 LAB — CULTURE, BLOOD (ROUTINE X 2): Culture: NO GROWTH

## 2024-10-26 ENCOUNTER — Ambulatory Visit: Payer: Self-pay

## 2024-10-26 ENCOUNTER — Telehealth: Payer: Self-pay | Admitting: *Deleted

## 2024-10-26 NOTE — Telephone Encounter (Signed)
 I think she needs to be examined, either here next week or at an urgent care, to determine the best treatment

## 2024-10-26 NOTE — Telephone Encounter (Signed)
 Spoke with [name] with [facility] advised VO requested have been approved by Dr Johnny, voiced understanding

## 2024-10-26 NOTE — Telephone Encounter (Unsigned)
 Copied from CRM #[id number]. Topic: Clinical - Home Health Verbal Orders >> Oct 26, 2024 12:27 PM Avram MATSU wrote: Caller/Agency: [name] Rushing Number: [phone] Service Requested: wound care Frequency: knee was bumped agasint the wheel chair and the wound open. 4cm long 1 cm wide. Wet to dry dressing   Any new concerns about the patient? Yes

## 2024-10-26 NOTE — Telephone Encounter (Signed)
 Copied from CRM #[id number]. Topic: Clinical - Order For Equipment >> Oct 26, 2024  4:15 PM Revonda D wrote: Reason for CRM: Luke with [employer] [facility] is calling to get an update on the request for a referral for DME. Luke stated that the request was faxed on 10/24/24 for a knee device and stated that she just needs the referral placed with [facility]. Luke stated that she will be re faxing the forms today.

## 2024-10-26 NOTE — Telephone Encounter (Signed)
 FYI Only or Action Required?: Action required by provider: request for wound care orders.  Patient was last seen in primary care on 06/20/2024 by Crystal Little LABOR, MD.  Called Nurse Triage reporting Advice Only.  Symptoms began several weeks ago.  Interventions attempted: Other: wet to dry saline dressing applied.  Symptoms are: gradually worsening.  Triage Disposition: Call PCP When Office is Open  Patient/caregiver understands and will follow disposition?:      Message from [name] sent at 10/26/2024 12:30 PM EST  Reason for Triage: due to open wound on need, its painful and swollen.   Reason for Disposition  [1] Caller requesting NON-URGENT health information AND [2] PCP's office is the best resource  Answer Assessment - Initial Assessment Questions [name], RN with [facility] called and says the patient has an open wound on the right knee on the scar from healed knee surgery that happened by hitting the knee on the wheelchair. This initially happened on 10/14/24 and patient went to the UC, prescribed Keflex  to take x 7 days. She says today she was going to do the final assessment for discharge and the wound is larger and open, measuring 4 cm long x 1 cm wide, clear drainage, beefy red, swelling on the knee. She placed a wet-dry dressing on there and instructed the family to change it daily. She says she will need an order to continue with dressing changes or to apply steri strips, however the provider wants the care of the wound to be done. Advised I will send this to Dr .Crystal for recommendation. [name] says to call her number back and leave a message on the secure line [phone] with the orders.   1. REASON FOR CALL: What is the main reason for your call? or How can I best help you?     Need order for wet-dry dressing  Protocols used: Information Only Call - No Triage-A-AH

## 2024-10-26 NOTE — Telephone Encounter (Signed)
 Pt has been scheduled for an office visit on 10/30/24

## 2024-10-26 NOTE — Telephone Encounter (Signed)
 Yes please OK the orders

## 2024-10-30 ENCOUNTER — Ambulatory Visit: Admitting: Family Medicine

## 2024-10-30 ENCOUNTER — Encounter: Payer: Self-pay | Admitting: Family Medicine

## 2024-10-30 VITALS — BP 120/60 | HR 74 | Temp 97.5°F | Wt 205.0 lb

## 2024-10-30 DIAGNOSIS — Z96651 Presence of right artificial knee joint: Secondary | ICD-10-CM | POA: Diagnosis not present

## 2024-10-30 DIAGNOSIS — S81011D Laceration without foreign body, right knee, subsequent encounter: Secondary | ICD-10-CM

## 2024-10-30 DIAGNOSIS — S81011A Laceration without foreign body, right knee, initial encounter: Secondary | ICD-10-CM | POA: Insufficient documentation

## 2024-10-30 NOTE — Telephone Encounter (Signed)
 Pt form was successfully faxed to [facility] (Dr Melodi) for review per Dr Johnny

## 2024-10-30 NOTE — Telephone Encounter (Signed)
 Pt form has been received and given to Dr Johnny to review with pt at her office visit this afternoon

## 2024-10-30 NOTE — Progress Notes (Signed)
" ° °  Subjective:    Patient ID: Crystal Little, female    DOB: 1940/10/23, 84 y.o.   MRN: [id number]  HPI Here with her daughter-in-law to check a wound on her right knee. On 08-13-24 she had a total right knee replacement per Dr. Melodi, and then she went back to surgery on 09-03-24 to repair a tendon and to remove bone fragments. After that she went to [place] for a rehab stay. On 10-13-24 she struck the right knee on her wwheelchair, and this opened the surgical scar. She was seen in urgent care on 10-14-24, and they determined that no sutures were needed. They recommended daily dry dressings and they gave her 7 days of Cephalexin . Since then she has been getting {PT at home through [facility]. The wound has been slowly closing in, and she feels no pain from it. She was checked at [facility] on 10-16-24 and again on 10-18-24, and they were pleased with her progress. The family asks us  to check it today to make sure there is no infection.    Review of Systems  Constitutional: Negative.   Respiratory: Negative.    Cardiovascular: Negative.   Skin:  Positive for wound.       Objective:   Physical Exam Constitutional:      General: She is not in acute distress.    Comments: In a wheelchair   Cardiovascular:     Rate and Rhythm: Normal rate and regular rhythm.     Pulses: Normal pulses.     Heart sounds: Normal heart sounds.  Pulmonary:     Effort: Pulmonary effort is normal.     Breath sounds: Normal breath sounds.  Skin:    Comments: There is a partially healed vertical wound on the anterior right knee. Fresh granulation tissue can be seen at the base of the wound. No erythema or warmth.   Neurological:     Mental Status: She is alert.           Assessment & Plan:  She has a skin wound on the right knee that is partially healed. There is no sign of infection. I suggested they apply Vaseline ointment to the wound daily before covering it with a bandage. She will follow  up with Orthopedics as scheduled. I personally spent a total of 34 minutes in the care of the patient today including getting/reviewing separately obtained history, performing a medically appropriate exam/evaluation, and counseling and educating.  Garnette Olmsted, MD   "

## 2024-11-01 ENCOUNTER — Telehealth: Payer: Self-pay

## 2024-11-01 NOTE — Telephone Encounter (Signed)
 Left a detailed message for agent advised that the form faxed was received and faxed to pt Orthopedic Dr who is managing pt problems per Dr Johnny advise. Holding a copy of the form on my desk.

## 2024-11-01 NOTE — Telephone Encounter (Signed)
 Copied from CRM #[id number]. Topic: Clinical - Order For Equipment >> Oct 26, 2024  4:15 PM Revonda D wrote: Reason for CRM: Luke with [employer] is calling to get an update on the request for a referral for DME. Luke stated that the request was faxed on 10/24/24 for a knee device and stated that she just needs the referral placed with [facility]. Luke stated that she will be re faxing the forms today. >> Oct 31, 2024  2:25 PM Mercedes MATSU wrote: Luke with [employer] [facility] is calling to get an update on the request for a referral for DME. Luke stated that the request was faxed TWICE for a knee device and stated that she just needs the referral placed with [facility].

## 2024-11-05 NOTE — Telephone Encounter (Unsigned)
 Copied from CRM #[id number]. Topic: Referral - Question >> Nov 05, 2024 10:45 AM Alfonso HERO wrote: Reason for CRM: Luke from [employer] calling about DME referral that was signed and faxed back to her office. Stating that because they are Out of network this needs to be submitted to the patients insurance directly. Once it has been sent then she can reach out to them for [facility] acception.
